# Patient Record
Sex: Female | Born: 1950 | Race: White | Hispanic: No | Marital: Single | Smoking: Former smoker
Health system: Southern US, Community
[De-identification: ages and names within clinical notes are randomized; demographics above are authoritative.]

## PROBLEM LIST (undated history)

## (undated) DIAGNOSIS — Z923 Personal history of irradiation: Secondary | ICD-10-CM

## (undated) DIAGNOSIS — K449 Diaphragmatic hernia without obstruction or gangrene: Secondary | ICD-10-CM

## (undated) DIAGNOSIS — Z8669 Personal history of other diseases of the nervous system and sense organs: Secondary | ICD-10-CM

## (undated) DIAGNOSIS — M1990 Unspecified osteoarthritis, unspecified site: Secondary | ICD-10-CM

## (undated) DIAGNOSIS — F329 Major depressive disorder, single episode, unspecified: Secondary | ICD-10-CM

## (undated) DIAGNOSIS — Z5189 Encounter for other specified aftercare: Secondary | ICD-10-CM

## (undated) DIAGNOSIS — F32A Depression, unspecified: Secondary | ICD-10-CM

## (undated) DIAGNOSIS — R3129 Other microscopic hematuria: Secondary | ICD-10-CM

## (undated) DIAGNOSIS — C801 Malignant (primary) neoplasm, unspecified: Secondary | ICD-10-CM

## (undated) DIAGNOSIS — R7301 Impaired fasting glucose: Secondary | ICD-10-CM

## (undated) DIAGNOSIS — Z0000 Encounter for general adult medical examination without abnormal findings: Secondary | ICD-10-CM

## (undated) DIAGNOSIS — Z9221 Personal history of antineoplastic chemotherapy: Secondary | ICD-10-CM

## (undated) DIAGNOSIS — K219 Gastro-esophageal reflux disease without esophagitis: Secondary | ICD-10-CM

## (undated) DIAGNOSIS — Z8619 Personal history of other infectious and parasitic diseases: Secondary | ICD-10-CM

## (undated) DIAGNOSIS — L589 Radiodermatitis, unspecified: Secondary | ICD-10-CM

## (undated) DIAGNOSIS — R519 Headache, unspecified: Secondary | ICD-10-CM

## (undated) DIAGNOSIS — T7840XA Allergy, unspecified, initial encounter: Secondary | ICD-10-CM

## (undated) HISTORY — DX: Encounter for other specified aftercare: Z51.89

## (undated) HISTORY — DX: Gastro-esophageal reflux disease without esophagitis: K21.9

## (undated) HISTORY — DX: Allergy, unspecified, initial encounter: T78.40XA

## (undated) HISTORY — DX: Major depressive disorder, single episode, unspecified: F32.9

## (undated) HISTORY — DX: Personal history of other diseases of the nervous system and sense organs: Z86.69

## (undated) HISTORY — PX: OTHER SURGICAL HISTORY: SHX169

## (undated) HISTORY — DX: Personal history of other infectious and parasitic diseases: Z86.19

## (undated) HISTORY — DX: Malignant (primary) neoplasm, unspecified: C80.1

## (undated) HISTORY — DX: Encounter for general adult medical examination without abnormal findings: Z00.00

## (undated) HISTORY — DX: Other microscopic hematuria: R31.29

## (undated) HISTORY — DX: Unspecified osteoarthritis, unspecified site: M19.90

## (undated) HISTORY — DX: Impaired fasting glucose: R73.01

## (undated) HISTORY — DX: Depression, unspecified: F32.A

---

## 1998-01-07 ENCOUNTER — Ambulatory Visit: Admission: RE | Admit: 1998-01-07 | Discharge: 1998-01-07 | Payer: Self-pay | Admitting: Family Medicine

## 1998-01-19 ENCOUNTER — Ambulatory Visit: Admission: RE | Admit: 1998-01-19 | Discharge: 1998-01-19 | Payer: Self-pay | Admitting: Family Medicine

## 1998-11-11 ENCOUNTER — Ambulatory Visit (HOSPITAL_COMMUNITY): Admission: RE | Admit: 1998-11-11 | Discharge: 1998-11-11 | Payer: Self-pay | Admitting: Family Medicine

## 1998-11-11 ENCOUNTER — Encounter: Payer: Self-pay | Admitting: Family Medicine

## 1999-03-21 ENCOUNTER — Other Ambulatory Visit: Admission: RE | Admit: 1999-03-21 | Discharge: 1999-03-21 | Payer: Self-pay | Admitting: Gynecology

## 1999-12-14 ENCOUNTER — Encounter: Admission: RE | Admit: 1999-12-14 | Discharge: 1999-12-14 | Payer: Self-pay | Admitting: Family Medicine

## 1999-12-14 ENCOUNTER — Encounter: Payer: Self-pay | Admitting: Family Medicine

## 2000-04-01 ENCOUNTER — Other Ambulatory Visit: Admission: RE | Admit: 2000-04-01 | Discharge: 2000-04-01 | Payer: Self-pay | Admitting: Gynecology

## 2001-04-03 ENCOUNTER — Encounter: Admission: RE | Admit: 2001-04-03 | Discharge: 2001-04-03 | Payer: Self-pay | Admitting: Gynecology

## 2001-04-03 ENCOUNTER — Encounter: Payer: Self-pay | Admitting: Gynecology

## 2001-04-03 ENCOUNTER — Other Ambulatory Visit: Admission: RE | Admit: 2001-04-03 | Discharge: 2001-04-03 | Payer: Self-pay | Admitting: Gynecology

## 2001-10-25 ENCOUNTER — Ambulatory Visit (HOSPITAL_COMMUNITY): Admission: RE | Admit: 2001-10-25 | Discharge: 2001-10-25 | Payer: Self-pay | Admitting: Obstetrics and Gynecology

## 2001-10-25 ENCOUNTER — Encounter (INDEPENDENT_AMBULATORY_CARE_PROVIDER_SITE_OTHER): Payer: Self-pay

## 2003-01-27 ENCOUNTER — Encounter: Payer: Self-pay | Admitting: Obstetrics and Gynecology

## 2003-01-27 ENCOUNTER — Encounter: Admission: RE | Admit: 2003-01-27 | Discharge: 2003-01-27 | Payer: Self-pay | Admitting: Obstetrics and Gynecology

## 2003-02-02 ENCOUNTER — Other Ambulatory Visit: Admission: RE | Admit: 2003-02-02 | Discharge: 2003-02-02 | Payer: Self-pay | Admitting: Obstetrics and Gynecology

## 2004-04-17 ENCOUNTER — Ambulatory Visit: Payer: Self-pay | Admitting: Internal Medicine

## 2004-04-24 ENCOUNTER — Ambulatory Visit: Payer: Self-pay | Admitting: Internal Medicine

## 2005-02-06 ENCOUNTER — Ambulatory Visit: Payer: Self-pay | Admitting: Internal Medicine

## 2005-08-02 ENCOUNTER — Ambulatory Visit: Payer: Self-pay | Admitting: Internal Medicine

## 2005-08-13 ENCOUNTER — Ambulatory Visit: Payer: Self-pay | Admitting: Internal Medicine

## 2005-10-09 ENCOUNTER — Ambulatory Visit: Payer: Self-pay | Admitting: Internal Medicine

## 2006-08-05 ENCOUNTER — Ambulatory Visit: Payer: Self-pay | Admitting: Internal Medicine

## 2006-08-19 ENCOUNTER — Ambulatory Visit: Payer: Self-pay | Admitting: Internal Medicine

## 2006-08-27 ENCOUNTER — Ambulatory Visit (HOSPITAL_COMMUNITY): Admission: RE | Admit: 2006-08-27 | Discharge: 2006-08-27 | Payer: Self-pay | Admitting: Internal Medicine

## 2006-11-12 ENCOUNTER — Ambulatory Visit: Payer: Self-pay | Admitting: Internal Medicine

## 2006-11-12 LAB — CONVERTED CEMR LAB
Albumin: 3.8 g/dL (ref 3.5–5.2)
Basophils Absolute: 0 10*3/uL (ref 0.0–0.1)
Bilirubin, Direct: 0.1 mg/dL (ref 0.0–0.3)
Calcium: 9.2 mg/dL (ref 8.4–10.5)
Cholesterol: 177 mg/dL (ref 0–200)
Eosinophils Absolute: 0.1 10*3/uL (ref 0.0–0.6)
Eosinophils Relative: 1.9 % (ref 0.0–5.0)
GFR calc Af Amer: 96 mL/min
GFR calc non Af Amer: 79 mL/min
Glucose, Bld: 110 mg/dL — ABNORMAL HIGH (ref 70–99)
Hgb A1c MFr Bld: 5.9 % (ref 4.6–6.0)
Lymphocytes Relative: 37.1 % (ref 12.0–46.0)
MCHC: 34.5 g/dL (ref 30.0–36.0)
MCV: 92.4 fL (ref 78.0–100.0)
Neutro Abs: 2.3 10*3/uL (ref 1.4–7.7)
Neutrophils Relative %: 52.6 % (ref 43.0–77.0)
Platelets: 241 10*3/uL (ref 150–400)
Sodium: 143 meq/L (ref 135–145)
TSH: 1.04 microintl units/mL (ref 0.35–5.50)
Total CHOL/HDL Ratio: 3.1
Triglycerides: 50 mg/dL (ref 0–149)
WBC: 4.5 10*3/uL (ref 4.5–10.5)

## 2006-11-19 ENCOUNTER — Other Ambulatory Visit: Admission: RE | Admit: 2006-11-19 | Discharge: 2006-11-19 | Payer: Self-pay | Admitting: Internal Medicine

## 2006-11-19 ENCOUNTER — Encounter: Payer: Self-pay | Admitting: Internal Medicine

## 2006-11-19 ENCOUNTER — Ambulatory Visit: Payer: Self-pay | Admitting: Internal Medicine

## 2006-11-19 LAB — CONVERTED CEMR LAB

## 2006-11-22 ENCOUNTER — Ambulatory Visit: Payer: Self-pay | Admitting: Internal Medicine

## 2007-06-09 ENCOUNTER — Ambulatory Visit: Payer: Self-pay | Admitting: Internal Medicine

## 2007-06-09 DIAGNOSIS — N3000 Acute cystitis without hematuria: Secondary | ICD-10-CM | POA: Insufficient documentation

## 2007-06-09 LAB — CONVERTED CEMR LAB
Bilirubin Urine: NEGATIVE
Glucose, Urine, Semiquant: NEGATIVE
Ketones, urine, test strip: NEGATIVE
Specific Gravity, Urine: 1.02
pH: 6.5

## 2007-06-19 ENCOUNTER — Telehealth: Payer: Self-pay | Admitting: *Deleted

## 2008-01-20 ENCOUNTER — Telehealth: Payer: Self-pay | Admitting: *Deleted

## 2008-01-20 ENCOUNTER — Encounter: Payer: Self-pay | Admitting: Internal Medicine

## 2008-01-20 DIAGNOSIS — J309 Allergic rhinitis, unspecified: Secondary | ICD-10-CM | POA: Insufficient documentation

## 2008-01-20 DIAGNOSIS — F32A Depression, unspecified: Secondary | ICD-10-CM | POA: Insufficient documentation

## 2008-01-20 DIAGNOSIS — G43909 Migraine, unspecified, not intractable, without status migrainosus: Secondary | ICD-10-CM | POA: Insufficient documentation

## 2008-01-20 DIAGNOSIS — F329 Major depressive disorder, single episode, unspecified: Secondary | ICD-10-CM

## 2008-02-05 ENCOUNTER — Ambulatory Visit (HOSPITAL_COMMUNITY): Admission: RE | Admit: 2008-02-05 | Discharge: 2008-02-05 | Payer: Self-pay | Admitting: Internal Medicine

## 2008-02-11 ENCOUNTER — Ambulatory Visit: Payer: Self-pay | Admitting: Internal Medicine

## 2008-02-11 ENCOUNTER — Encounter: Admission: RE | Admit: 2008-02-11 | Discharge: 2008-02-11 | Payer: Self-pay | Admitting: Internal Medicine

## 2008-02-11 LAB — CONVERTED CEMR LAB
AST: 16 units/L (ref 0–37)
Albumin: 4.1 g/dL (ref 3.5–5.2)
Alkaline Phosphatase: 67 units/L (ref 39–117)
BUN: 20 mg/dL (ref 6–23)
Chloride: 110 meq/L (ref 96–112)
Eosinophils Relative: 1.4 % (ref 0.0–5.0)
GFR calc Af Amer: 96 mL/min
Glucose, Bld: 110 mg/dL — ABNORMAL HIGH (ref 70–99)
HCT: 39.6 % (ref 36.0–46.0)
HDL: 50.5 mg/dL (ref >39.0)
Monocytes Absolute: 0.4 10*3/uL (ref 0.1–1.0)
Monocytes Relative: 7.5 % (ref 3.0–12.0)
Nitrite: NEGATIVE
Platelets: 260 10*3/uL (ref 150–400)
Potassium: 5.5 meq/L — ABNORMAL HIGH (ref 3.5–5.1)
RBC: 4.23 M/uL (ref 3.87–5.11)
Specific Gravity, Urine: 1.015
Total CHOL/HDL Ratio: 3.4
Total Protein: 7.5 g/dL (ref 6.0–8.3)
WBC Urine, dipstick: NEGATIVE
WBC: 5.4 10*3/uL (ref 4.5–10.5)

## 2008-02-18 ENCOUNTER — Telehealth: Payer: Self-pay | Admitting: Internal Medicine

## 2008-02-18 ENCOUNTER — Ambulatory Visit: Payer: Self-pay | Admitting: Internal Medicine

## 2008-02-18 DIAGNOSIS — R7309 Other abnormal glucose: Secondary | ICD-10-CM | POA: Insufficient documentation

## 2008-02-18 DIAGNOSIS — R3129 Other microscopic hematuria: Secondary | ICD-10-CM | POA: Insufficient documentation

## 2008-03-23 ENCOUNTER — Telehealth: Payer: Self-pay | Admitting: Internal Medicine

## 2008-04-26 ENCOUNTER — Telehealth: Payer: Self-pay | Admitting: Internal Medicine

## 2008-04-28 ENCOUNTER — Ambulatory Visit: Payer: Self-pay | Admitting: Internal Medicine

## 2008-04-29 ENCOUNTER — Encounter: Payer: Self-pay | Admitting: Internal Medicine

## 2008-04-29 LAB — CONVERTED CEMR LAB: Total CHOL/HDL Ratio: 2.8

## 2008-05-12 ENCOUNTER — Telehealth: Payer: Self-pay | Admitting: Internal Medicine

## 2009-04-04 ENCOUNTER — Ambulatory Visit: Payer: Self-pay | Admitting: Internal Medicine

## 2009-04-04 LAB — CONVERTED CEMR LAB
AST: 15 units/L (ref 0–37)
Basophils Absolute: 0 10*3/uL (ref 0.0–0.1)
CO2: 31 meq/L (ref 19–32)
Chloride: 104 meq/L (ref 96–112)
Eosinophils Absolute: 0.1 10*3/uL (ref 0.0–0.7)
HCT: 40.6 % (ref 36.0–46.0)
HDL: 59 mg/dL (ref >39.00)
LDL Cholesterol: 107 mg/dL — ABNORMAL HIGH (ref 0–99)
Leukocytes, UA: NEGATIVE
Lymphocytes Relative: 38.6 % (ref 12.0–46.0)
Lymphs Abs: 1.9 10*3/uL (ref 0.7–4.0)
Monocytes Relative: 7.6 % (ref 3.0–12.0)
Nitrite: NEGATIVE
Platelets: 213 10*3/uL (ref 150.0–400.0)
RDW: 11.9 % (ref 11.5–14.6)
Sodium: 143 meq/L (ref 135–145)
Specific Gravity, Urine: 1.015 (ref 1.000–1.030)
TSH: 1.7 microintl units/mL (ref 0.35–5.50)
Total Bilirubin: 0.7 mg/dL (ref 0.3–1.2)
Total CHOL/HDL Ratio: 3
Triglycerides: 28 mg/dL (ref 0.0–149.0)
Urine Glucose: NEGATIVE mg/dL
Urobilinogen, UA: 0.2 (ref 0.0–1.0)

## 2009-04-11 ENCOUNTER — Ambulatory Visit: Payer: Self-pay | Admitting: Internal Medicine

## 2009-04-11 DIAGNOSIS — H519 Unspecified disorder of binocular movement: Secondary | ICD-10-CM | POA: Insufficient documentation

## 2009-04-29 ENCOUNTER — Ambulatory Visit (HOSPITAL_BASED_OUTPATIENT_CLINIC_OR_DEPARTMENT_OTHER): Admission: RE | Admit: 2009-04-29 | Discharge: 2009-04-29 | Payer: Self-pay | Admitting: Ophthalmology

## 2009-04-29 HISTORY — PX: EYE SURGERY: SHX253

## 2010-03-30 ENCOUNTER — Ambulatory Visit (HOSPITAL_COMMUNITY): Admission: RE | Admit: 2010-03-30 | Discharge: 2010-03-30 | Payer: Self-pay | Admitting: Internal Medicine

## 2010-03-30 LAB — HM MAMMOGRAPHY

## 2010-04-13 ENCOUNTER — Telehealth: Payer: Self-pay | Admitting: Internal Medicine

## 2010-04-17 ENCOUNTER — Encounter: Payer: Self-pay | Admitting: Internal Medicine

## 2010-04-24 ENCOUNTER — Ambulatory Visit: Payer: Self-pay | Admitting: Internal Medicine

## 2010-07-03 ENCOUNTER — Encounter: Payer: Self-pay | Admitting: Internal Medicine

## 2010-07-13 NOTE — Letter (Signed)
Summary: Health Screening Results  Health Screening Results   Imported By: Maryln Gottron 04/27/2010 10:13:07  _____________________________________________________________________  External Attachment:    Type:   Image     Comment:   External Document

## 2010-07-13 NOTE — Progress Notes (Signed)
Summary: Pt has question re: cpx labs and Health Screening labs  Phone Note Call from Patient Call back at Home Phone [phone]   Caller: Patient Summary of Call: Pt is sch to have cpx labs done on Monday 04/17/10 and is also sch to have her Health Screening done on the same day. The same labs are going to be drawn, so pt is wondering if she can just use the Health Screening labs, as her cpx labs? Pls advise. Pt also wanted to know if she could do the UA on 04/24/10, when she is sch for the cpx? Initial call taken by: Lucy Antigua,  April 13, 2010 8:58 AM  Follow-up for Phone Call        Per Dr. Fabian Sharp- this is fine, just have her bring in a copy when she comes in for her cpx. We can do a ua then. Follow-up by: Romualdo Bolk, CMA Duncan Dull),  April 13, 2010 11:31 AM  Additional Follow-up for Phone Call Additional follow up Details #1::        Pt has informed with the info noted above. Additional Follow-up by: Lucy Antigua,  April 13, 2010 1:30 PM

## 2010-07-13 NOTE — Assessment & Plan Note (Signed)
Summary: CPX // RS   Vital Signs:  Patient profile:   60 year old female Menstrual status:  postmenopausal Height:      63.75 inches Weight:      144 pounds BMI:     25.00 Temp:     98.3 degrees F oral Pulse rate:   74 / minute BP sitting:   102 / 60  (left arm) Cuff size:   regular  Vitals Entered By: Alfred Levins, CMA (April 24, 2010 8:55 AM) CC: cpx, no pap   History of Present Illness: Cindy [name] comes in today  for preventive visit . Since last visit  here  there have been no major changes in health status  . She has not had her urology check but did  have mammmo . Is due for  gyne check  . Last pap 2009 and plans to see her gyne. No new problems  Mood Prozac doing well and wants to continue this  Immuniz utd  flu shot at work.  had lab at [facility].   Preventive Screening-Counseling & Management  Alcohol-Tobacco     Alcohol drinks/day: <1     Alcohol type: wine     Smoking Status: quit     Year Quit: age 86  Caffeine-Diet-Exercise     Caffeine use/day: 2     Does Patient Exercise: no  Hep-HIV-STD-Contraception     Dental Visit-last 6 months no     Sun Exposure-Excessive: no  Safety-Violence-Falls     Seat Belt Use: yes     Firearms in the Home: no firearms in the home     Smoke Detectors: yes  Current Medications (verified): 1)  Prozac 20 Mg  Caps (Fluoxetine Hcl) .Marland Kitchen.. 1 By Mouth Once Daily  Allergies (verified): 1)  ! Effexor 2)  ! Bromfed  Past History:  Past medical, surgical, family and social histories (including risk factors) reviewed, and no changes noted (except as noted below).  Past Medical History: Reviewed history from 04/11/2009 and no changes required. Depression   ? if unipolar bipolar  works the best.  Allergic rhinitis Hx of migraines  Hx elevated fbs   G1P0 CONSULTANTS : gyne : [facility]    Past Surgical History: Reviewed history from 04/11/2009 and no changes required. endometrial polyps  Rt eye tighten up  04/29/2009  Family History: Reviewed history from 04/11/2009 and no changes required. Father bladder cancer with cystectomy, thyroid  Family History of Colon CA 1st degree relative mom  alzheimers 77.   no dm  Sibs 2 bro and sis healthy.  Social History: Reviewed history from 04/11/2009 and no changes required. hh of 1  2 cats  etoh   caffeine   2 mugs  exercise   [facility].    Review of Systems  The patient denies anorexia, fever, weight loss, weight gain, vision loss, decreased hearing, hoarseness, chest pain, syncope, dyspnea on exertion, peripheral edema, prolonged cough, headaches, hemoptysis, abdominal pain, melena, hematochezia, severe indigestion/heartburn, hematuria, incontinence, muscle weakness, suspicious skin lesions, transient blindness, difficulty walking, depression, unusual weight change, abnormal bleeding, enlarged lymph nodes, angioedema, and breast masses.    Physical Exam  General:  Well-developed,well-nourished,in no acute distress; alert,appropriate and cooperative throughout examination Head:  Normocephalic and atraumatic without obvious abnormalities. No apparent alopecia or balding. Eyes:  PERRL, EOMs full, conjunctiva clear  Ears:  R ear normal, L ear normal, and no external deformities.   Nose:  no external deformity and no nasal discharge.  Mouth:  good dentition and pharynx pink and moist.   Neck:  No deformities, masses, or tenderness noted. Breasts:  No mass, nodules, thickening, tenderness, bulging, retraction, inflamation, nipple discharge or skin changes noted.   Lungs:  Normal respiratory effort, chest expands symmetrically. Lungs are clear to auscultation, no crackles or wheezes.no dullness.   Heart:  Normal rate and regular rhythm. S1 and S2 normal without gallop, murmur, click, rub or other extra sounds.no lifts.   Abdomen:  Bowel sounds positive,abdomen soft and non-tender without masses, organomegaly or hernias  noted. Genitalia:  due for with gyne  Pulses:  pulses intact without delay   Extremities:  no clubbing cyanosis or edema  Neurologic:  alert & oriented X3, strength normal in all extremities, gait normal, and DTRs symmetrical and normal.   Pt is A&Ox3,affect,speech,memory,attention,&motor skills appear intact.  Skin:  turgor normal, color normal, no suspicious lesions, no ecchymoses, and no petechiae.   Cervical Nodes:  No lymphadenopathy noted Axillary Nodes:  No palpable lymphadenopathy Inguinal Nodes:  No significant adenopathy Psych:  Normal eye contact, appropriate affect. Cognition appears normal.  EKG sinus rhythym. No chage  q in  V2 no acute changes   Labs from [facility] are normal except bg 100  Impression & Recommendations:  Problem # 1:  PREVENTIVE HEALTH CARE (ICD-V70.0) Discussed nutrition,exercise,diet,healthy weight, vitamin D and calcium.  Orders: UA Dipstick w/o Micro (automated)  (81003) EKG w/ Interpretation (93000)  Problem # 2:  HYPERGLYCEMIA (ICD-790.29) Assessment: Improved  lifestyle intervention  Labs Reviewed: Creat: 0.8 (04/04/2009)     Orders: EKG w/ Interpretation (93000)  Problem # 3:  MICROSCOPIC HEMATURIA (ICD-599.72) didi follow up for various reasons   will recheck ua today  .  has family hx of bladder cancer  Orders: UA Dipstick w/o Micro (automated)  (81003)  Problem # 4:  DEPRESSION (ICD-311) Assessment: Improved  stable    continue med for the year  have pharmacy call for refill    do yearly check.  Her updated medication list for this problem includes:    Prozac 20 Mg Caps (Fluoxetine hcl) .Marland Kitchen... 1 by mouth once daily  Discussed treatment options, including trial of antidpressant medication. Will refer to behavioral health. Follow-up call in in 24-48 hours and recheck in 2 weeks, sooner as needed. Patient agrees to call if any worsening of symptoms or thoughts of doing harm arise. Verified that the patient has no suicidal  ideation at this time.   Complete Medication List: 1)  Prozac 20 Mg Caps (Fluoxetine hcl) .Marland Kitchen.. 1 by mouth once daily  Patient Instructions: 1)  get your pap gyne exam 2)  exercise,  and diet with calcium and vit d in it will prevent osteoporosis. 3)  Get urology evaluation with Dr Isabel Caprice and call if we need to help. 4)  have pharmacy call for reill  5)  Check up in a year or as needed.   Orders Added: 1)  UA Dipstick w/o Micro (automated)  [81003] 2)  EKG w/ Interpretation [93000] 3)  Est. Patient 40-64 years [99396]    Preventive Care Screening  Prior Values:    Pap Smear:  Done (11/19/2006)    Mammogram:  ASSESSMENT: Negative - BI-RADS 1^MM DIGITAL SCREENING (03/30/2010)    Colonoscopy:  Done (08/19/2006)    Last Tetanus Booster:  Tdap (11/19/2006)  Appended Document: CPX // RS  Laboratory Results   Urine Tests    Routine Urinalysis   Color: yellow Appearance: Clear Glucose: negative   (  Normal Range: Negative) Bilirubin: negative   (Normal Range: Negative) Ketone: negative   (Normal Range: Negative) Spec. Gravity: 1.015   (Normal Range: 1.003-1.035) Blood: trace-intact   (Normal Range: Negative) pH: 8.0   (Normal Range: 5.0-8.0) Protein: negative   (Normal Range: Negative) Urobilinogen: 0.2   (Normal Range: 0-1) Nitrite: negative   (Normal Range: Negative) Leukocyte Esterace: negative   (Normal Range: Negative)    Comments: Rita Ohara  April 24, 2010 2:02 PM     tell patient  that there is trace blood in urine  today  wkp.    Pt aware of results. Romualdo Bolk, CMA (AAMA)  April 26, 2010 9:14 AM

## 2010-09-05 ENCOUNTER — Encounter: Payer: Self-pay | Admitting: Internal Medicine

## 2010-09-05 ENCOUNTER — Ambulatory Visit (HOSPITAL_COMMUNITY)
Admission: RE | Admit: 2010-09-05 | Discharge: 2010-09-05 | Disposition: A | Discharge status: Home / Self Care | Payer: Commercial Managed Care - PPO | Source: Ambulatory Visit | Attending: Internal Medicine | Admitting: Internal Medicine

## 2010-09-05 ENCOUNTER — Other Ambulatory Visit: Payer: Self-pay | Admitting: Internal Medicine

## 2010-09-05 ENCOUNTER — Telehealth: Payer: Self-pay | Admitting: *Deleted

## 2010-09-05 DIAGNOSIS — R059 Cough, unspecified: Secondary | ICD-10-CM

## 2010-09-05 DIAGNOSIS — R05 Cough: Secondary | ICD-10-CM

## 2010-09-05 DIAGNOSIS — R079 Chest pain, unspecified: Secondary | ICD-10-CM | POA: Insufficient documentation

## 2010-09-05 NOTE — Telephone Encounter (Signed)
Pt would like Dr. Fabian Sharp to order a chest xray as her father has pneumonia and she wants to be sure her chest is clear.  She does not feel well, and is having a productive cough (not sure what color).  The order needs to be called to [employer] where she works.

## 2010-09-05 NOTE — Telephone Encounter (Signed)
Left message to call back  

## 2010-09-05 NOTE — Telephone Encounter (Signed)
Spoke to pt and she hasn't taken her temp but feels feverish. Pt would like the rx faxed to [phone] attn: X-ray. Appt made and note faxed.

## 2010-09-05 NOTE — Telephone Encounter (Signed)
Her original note states she does not think she has fever but not feel well and has productive cough with ? Color of sputum.>???

## 2010-09-05 NOTE — Telephone Encounter (Signed)
?   Fever  If not feeling well we can order a chest x ray and then an Ov to review clinical exam and treatment plan

## 2010-09-05 NOTE — Telephone Encounter (Signed)
Pt calls back and is still not sure about fever but does not feel well.  Will call us back when she needs to come back and how to schedule chest xray.

## 2010-09-06 ENCOUNTER — Encounter: Payer: Self-pay | Admitting: Internal Medicine

## 2010-09-06 ENCOUNTER — Ambulatory Visit (INDEPENDENT_AMBULATORY_CARE_PROVIDER_SITE_OTHER): Payer: Commercial Managed Care - PPO | Admitting: Internal Medicine

## 2010-09-06 VITALS — BP 120/80 | HR 65 | Temp 98.6°F | Wt 142.0 lb

## 2010-09-06 DIAGNOSIS — J309 Allergic rhinitis, unspecified: Secondary | ICD-10-CM

## 2010-09-06 DIAGNOSIS — R059 Cough, unspecified: Secondary | ICD-10-CM

## 2010-09-06 DIAGNOSIS — R05 Cough: Secondary | ICD-10-CM

## 2010-09-06 NOTE — Progress Notes (Signed)
  Subjective:    Patient ID: Cindy [name], female    DOB: 1951-04-26, 60 y.o.   MRN: [id number]  HPI Patient comes in because of an acute problem. Onset  5 days ago cough   No fever felt feverish . She was concerned because her father a week or so ago had significant pneumonia. He does have COPD. She was worried that she could be getting pneumonia. Denies shortness of breath asthma or wheezing. She does have a history of allergies and is having some drainage and congestion but no significant itching no nocturnal cough or wheeze  Not taking allergy meds.     They tend to give her side effects and she minimizes her medications.  Review of Systems Negative for shortness of breath fever sweats vision change hearing change.    Objective:   Physical Exam Well-developed well-nourished in no acute distress mildly hoarse with throat clearing. HEENT: Normocephalic ;atraumatic , Eyes;  PERRL, EOMs  Full, lids and conjunctiva clear,,Ears: no deformities, canals nl, TM landmarks normal, Nose: no deformity or discharge  Mouth : OP clear without lesion or edema . Neck no masses or bruits or adenopathy Chest:  Clear to A&P without wheezes rales or rhonchi CV:  S1-S2 no gallops or murmurs peripheral perfusion is normal Reviewed chest x-ray  Negative for acute disease    Assessment & Plan:   Cough   either viral respiratory infection uncomplicated or allergic related. No evidence of air space disease. Discussion and counseling with patient expectant management. Patient prefers no medication symptomatically and I agree with her she will call with persistence or progressive or alarm features.

## 2010-10-27 NOTE — Op Note (Signed)
[facility]  Patient:    Cindy [name], Cindy [name] Visit Number: [id number] MRN: [id number]          Service Type: DSU Location: [facility] Attending Physician:  Sharon Mt Dictated by:   Rande Brunt. Eda Paschal, M.D. Proc. Date: 10/25/01 Admit Date:  10/25/2001 Discharge Date: 10/25/2001                             Operative Report  PREOPERATIVE DIAGNOSES:       Dysfunctional uterine bleeding, endometrial polyp.  POSTOPERATIVE DIAGNOSES:      Dysfunctional uterine bleeding, endometrial polyp.  NAME OF OPERATION:            Hysteroscopy, dilatation and curettage.  SURGEON:                      Daniel L. Eda Paschal, M.D.  ANESTHESIA:                   MAC.  INDICATIONS:                  Patient is a 60 year old female who presented to the office with menometrorrhagia.  Ultrasound confirmed an intrauterine cavitary defect most consistent with an endometrial polyp and she now enters the hospital for hysteroscopy and excision of the above.  FINDINGS:                     External and vaginal is within normal limits. Cervix is clean.  Uterus is top normal size and shape.  There is no uterine descensus whatsoever.  Adnexa are palpably normal.  At the time of hysteroscopy patient had a 1.5-2 cm endometrial polyp coming off the posterior wall of the fundus.  Once it had been excised top of the fundus, tubal ostia, anterior and posterior walls of the fundus, lower uterine segment, and endocervical canal were all free of disease.  PROCEDURE:                    Patient was taken to the operating room, put in the dorsal lithotomy position, prepped and draped in the usual sterile manner.  She was then given IV sedation by anesthesia and a 20 cc 1% plain paracervical block by Dr. Eda Paschal.  Anesthesia was good.  The cervix was dilated to a #33 Pratt dilator.  Hysteroscopic examination was done using the hysteroscopic resectoscope and using 3% Sorbitol to expand  the intrauterine cavity.  A camera was used for magnification.  A good view was obtained.  The patient had a large polyp on the posterior wall of the fundus.  The hysteroscope was removed and using the Randall polyp forceps the polyp could be removed without difficulty.  Endometrial curettings were also obtained and all was sent to pathology for tissue diagnosis.  The patient was rehysteroscoped and now had no further pathology.  The procedure was then terminated.  Estimated blood loss for the entire procedure was 50 cc with none replaced.  Fluid deficit was less than 100 cc.  Patient sustained a small laceration on the anterior lip of the cervix and this was sutured with three interrupteds of 2-0 Vicryl.  Patient left the operating room in satisfactory condition. Dictated by:   Rande Brunt. Eda Paschal, M.D. Attending Physician:  Sharon Mt DD:  10/25/01 TD:  10/27/01 Job: [id number]

## 2010-12-21 ENCOUNTER — Other Ambulatory Visit: Payer: Self-pay | Admitting: Internal Medicine

## 2011-03-22 ENCOUNTER — Other Ambulatory Visit: Payer: Self-pay | Admitting: Internal Medicine

## 2011-03-22 MED ORDER — FLUOXETINE HCL 20 MG PO CAPS: 20.0000 mg | ORAL_CAPSULE | Freq: Every day | ORAL | Status: DC

## 2011-03-22 NOTE — Telephone Encounter (Signed)
Pt switch phar location ([facility] [address]) [phone] need new rx fluoxetine 20mg .

## 2011-03-22 NOTE — Telephone Encounter (Signed)
Rx sent to pharmacy   

## 2011-06-08 ENCOUNTER — Telehealth: Payer: Self-pay | Admitting: Internal Medicine

## 2011-06-08 NOTE — Telephone Encounter (Signed)
Ok with me to do this

## 2011-06-08 NOTE — Telephone Encounter (Signed)
Patient states that she lives closer to [facility] location and would like to start seeing either Dr.Lowne or Dr. Beverely Low. Is this okay with you?

## 2011-06-13 ENCOUNTER — Other Ambulatory Visit: Payer: Commercial Managed Care - PPO

## 2011-06-20 ENCOUNTER — Other Ambulatory Visit (HOSPITAL_COMMUNITY)
Admission: RE | Admit: 2011-06-20 | Discharge: 2011-06-20 | Disposition: A | Discharge status: Home / Self Care | Payer: Managed Care, Other (non HMO) | Source: Ambulatory Visit | Attending: Family Medicine | Admitting: Family Medicine

## 2011-06-20 ENCOUNTER — Ambulatory Visit (INDEPENDENT_AMBULATORY_CARE_PROVIDER_SITE_OTHER): Payer: Commercial Managed Care - PPO | Admitting: Family Medicine

## 2011-06-20 ENCOUNTER — Encounter: Payer: Self-pay | Admitting: Family Medicine

## 2011-06-20 ENCOUNTER — Encounter: Payer: Commercial Managed Care - PPO | Admitting: Internal Medicine

## 2011-06-20 DIAGNOSIS — Z01419 Encounter for gynecological examination (general) (routine) without abnormal findings: Secondary | ICD-10-CM | POA: Insufficient documentation

## 2011-06-20 DIAGNOSIS — Z0000 Encounter for general adult medical examination without abnormal findings: Secondary | ICD-10-CM | POA: Insufficient documentation

## 2011-06-20 DIAGNOSIS — Z124 Encounter for screening for malignant neoplasm of cervix: Secondary | ICD-10-CM

## 2011-06-20 HISTORY — DX: Encounter for general adult medical examination without abnormal findings: Z00.00

## 2011-06-20 NOTE — Patient Instructions (Signed)
Schedule a fasting lab visit at your convenience Schedule your mammogram Keep up the good work!  You look great! Call with any questions or concerns Welcome!  We're glad to have you!

## 2011-06-20 NOTE — Progress Notes (Signed)
  Subjective:    Patient ID: Cindy [name], female    DOB: 19-Sep-1950, 61 y.o.   MRN: [id number]  HPI New to establish.  Previous MD- Panosh.  No concerns today.  Desires CPE.  UTD on colonoscopy 2008.  Due for mammo- plans to schedule.   Review of Systems Patient reports no vision/ hearing changes, adenopathy,fever, weight change,  persistant/recurrent hoarseness , swallowing issues, chest pain, palpitations, edema, persistant/recurrent cough, hemoptysis, dyspnea (rest/exertional/paroxysmal nocturnal), gastrointestinal bleeding (melena, rectal bleeding), abdominal pain, significant heartburn, bowel changes, GU symptoms (dysuria, hematuria, incontinence), Gyn symptoms (abnormal  bleeding, pain),  syncope, focal weakness, memory loss, numbness & tingling, skin/hair/nail changes, abnormal bruising or bleeding, anxiety, or depression.     Objective:   Physical Exam  General Appearance:    Alert, cooperative, no distress, appears stated age  Head:    Normocephalic, without obvious abnormality, atraumatic  Eyes:    PERRL, conjunctiva/corneas clear, EOM's intact, fundi    benign, both eyes  Ears:    Normal TM's and external ear canals, both ears  Nose:   Nares normal, septum midline, mucosa normal, no drainage    or sinus tenderness  Throat:   Lips, mucosa, and tongue normal; teeth and gums normal  Neck:   Supple, symmetrical, trachea midline, no adenopathy;    Thyroid: no enlargement/tenderness/nodules  Back:     Symmetric, no curvature, ROM normal, no CVA tenderness  Lungs:     Clear to auscultation bilaterally, respirations unlabored  Chest Wall:    No tenderness or deformity   Heart:    Regular rate and rhythm, S1 and S2 normal, no murmur, rub   or gallop  Breast Exam:    No tenderness, masses, or nipple abnormality  Abdomen:     Soft, non-tender, bowel sounds active all four quadrants,    no masses, no organomegaly  Genitalia:    External genitalia normal, cervix normal in  appearance, no CMT, uterus in normal size and position, adnexa w/out mass or tenderness, mucosa pink and moist, no lesions or discharge present  Rectal:    Normal external appearance  Extremities:   Extremities normal, atraumatic, no cyanosis or edema  Pulses:   2+ and symmetric all extremities  Skin:   Skin color, texture, turgor normal, no rashes or lesions  Lymph nodes:   Cervical, supraclavicular, and axillary nodes normal  Neurologic:   CNII-XII intact, normal strength, sensation and reflexes    throughout          Assessment & Plan:

## 2011-06-20 NOTE — Assessment & Plan Note (Signed)
Pt's PE WNL.  Will return for fasting labs.  Plans to schedule mammo.  UTD on colonoscopy.  Anticipatory guidance provided.

## 2011-06-20 NOTE — Assessment & Plan Note (Signed)
Pap collected. 

## 2011-06-25 ENCOUNTER — Other Ambulatory Visit (INDEPENDENT_AMBULATORY_CARE_PROVIDER_SITE_OTHER): Payer: Managed Care, Other (non HMO)

## 2011-06-25 DIAGNOSIS — Z01419 Encounter for gynecological examination (general) (routine) without abnormal findings: Secondary | ICD-10-CM

## 2011-06-25 LAB — CBC WITH DIFFERENTIAL/PLATELET
Basophils Absolute: 0 10*3/uL (ref 0.0–0.1)
Eosinophils Absolute: 0.1 10*3/uL (ref 0.0–0.7)
HCT: 39.3 % (ref 36.0–46.0)
Lymphs Abs: 2.2 10*3/uL (ref 0.7–4.0)
MCHC: 34.5 g/dL (ref 30.0–36.0)
MCV: 93.9 fl (ref 78.0–100.0)
Monocytes Absolute: 0.3 10*3/uL (ref 0.1–1.0)
Neutrophils Relative %: 48.9 % (ref 43.0–77.0)
Platelets: 220 10*3/uL (ref 150.0–400.0)
RDW: 13.1 % (ref 11.5–14.6)
WBC: 5.1 10*3/uL (ref 4.5–10.5)

## 2011-06-25 LAB — HEPATIC FUNCTION PANEL
ALT: 20 U/L (ref 0–35)
Albumin: 4.3 g/dL (ref 3.5–5.2)
Alkaline Phosphatase: 54 U/L (ref 39–117)
Bilirubin, Direct: 0 mg/dL (ref 0.0–0.3)
Total Protein: 7.3 g/dL (ref 6.0–8.3)

## 2011-06-25 LAB — LIPID PANEL
Cholesterol: 207 mg/dL — ABNORMAL HIGH (ref 0–200)
HDL: 71.6 mg/dL (ref >39.00)
VLDL: 8.2 mg/dL (ref 0.0–40.0)

## 2011-06-25 LAB — BASIC METABOLIC PANEL
Calcium: 8.9 mg/dL (ref 8.4–10.5)
GFR: 81.5 mL/min (ref >60.00)
Potassium: 4.6 mEq/L (ref 3.5–5.1)
Sodium: 135 mEq/L (ref 135–145)

## 2011-06-26 ENCOUNTER — Encounter: Payer: Self-pay | Admitting: *Deleted

## 2011-06-29 LAB — VITAMIN D 1,25 DIHYDROXY
Vitamin D 1, 25 (OH)2 Total: 37 pg/mL (ref 18–72)
Vitamin D3 1, 25 (OH)2: 37 pg/mL

## 2011-08-13 ENCOUNTER — Other Ambulatory Visit: Payer: Self-pay | Admitting: Family Medicine

## 2011-08-13 DIAGNOSIS — Z1231 Encounter for screening mammogram for malignant neoplasm of breast: Secondary | ICD-10-CM

## 2011-09-10 ENCOUNTER — Ambulatory Visit (HOSPITAL_COMMUNITY)
Admission: RE | Admit: 2011-09-10 | Discharge: 2011-09-10 | Disposition: A | Discharge status: Home / Self Care | Payer: Managed Care, Other (non HMO) | Source: Ambulatory Visit | Attending: Family Medicine | Admitting: Family Medicine

## 2011-09-10 DIAGNOSIS — Z1231 Encounter for screening mammogram for malignant neoplasm of breast: Secondary | ICD-10-CM

## 2011-09-24 ENCOUNTER — Other Ambulatory Visit: Payer: Self-pay | Admitting: *Deleted

## 2011-09-24 MED ORDER — FLUOXETINE HCL 20 MG PO CAPS: 20.0000 mg | ORAL_CAPSULE | Freq: Every day | ORAL | Status: DC

## 2011-09-24 NOTE — Telephone Encounter (Signed)
Rx sent 

## 2011-11-27 ENCOUNTER — Telehealth: Payer: Self-pay | Admitting: Family Medicine

## 2011-11-27 MED ORDER — FLUOXETINE HCL 20 MG PO CAPS: 20.0000 mg | ORAL_CAPSULE | Freq: Every day | ORAL | Status: DC

## 2011-11-27 NOTE — Telephone Encounter (Signed)
rx sent to pharmacy by e-script #90 with 3 refills per MD Guss Bunde previous notation

## 2011-11-27 NOTE — Telephone Encounter (Signed)
Pt states that she would like get Prozac filled for 3 month supply at a time with refill for a year. Pt indicated that this is how her previous PCP would fill med.Please advise ok to fill for a year.

## 2011-11-27 NOTE — Telephone Encounter (Signed)
This is fine 

## 2011-11-27 NOTE — Telephone Encounter (Signed)
Caller: Collyn/Patient; Phone Number: [phone]; Message from caller: Pt missed a call back from someone in the ofc.  She is unsure of the name.  No notes in EPIC.  Pls call.

## 2011-11-27 NOTE — Telephone Encounter (Signed)
Not sure why this came through

## 2012-07-26 ENCOUNTER — Other Ambulatory Visit: Payer: Self-pay

## 2012-08-25 ENCOUNTER — Other Ambulatory Visit: Payer: Self-pay | Admitting: Family Medicine

## 2012-08-25 DIAGNOSIS — Z1231 Encounter for screening mammogram for malignant neoplasm of breast: Secondary | ICD-10-CM

## 2012-09-24 ENCOUNTER — Ambulatory Visit (HOSPITAL_COMMUNITY): Payer: Managed Care, Other (non HMO)

## 2012-10-08 ENCOUNTER — Ambulatory Visit (HOSPITAL_COMMUNITY): Payer: Managed Care, Other (non HMO)

## 2012-10-15 ENCOUNTER — Ambulatory Visit (HOSPITAL_COMMUNITY): Payer: Managed Care, Other (non HMO)

## 2012-10-22 ENCOUNTER — Encounter: Payer: Managed Care, Other (non HMO) | Admitting: Family Medicine

## 2012-12-22 ENCOUNTER — Telehealth: Payer: Self-pay | Admitting: Family Medicine

## 2012-12-22 NOTE — Telephone Encounter (Signed)
Patient Information:  Caller Name: [place]  Phone: [phone]  Patient: Cindy [name], Cindy [name]  Gender: Female  DOB: 1950-08-09  Age: 62 Years  PCP: Sheliah Hatch.  Office Follow Up:  Does the office need to follow up with this patient?: No  Instructions For The Office: N/A  RN Note:  Cannot be seen 7-14 and wants appointment for 7-15. Scheduled fro 0945.  Symptoms  Reason For Call & Symptoms: Stepped on nail 7-14 and wants to know if tetnus is up to date. Has stoped bleeding.  Reviewed Health History In EMR: Yes  Reviewed Medications In EMR: Yes  Reviewed Allergies In EMR: Yes  Reviewed Surgeries / Procedures: Yes  Date of Onset of Symptoms: Unknown  Guideline(s) Used:  Puncture Wound  Disposition Per Guideline:   See Today in Office  Reason For Disposition Reached:   No tetanus booster in > 5 years  Advice Given:  N/A  Patient Will Follow Care Advice:  YES  Appointment Scheduled:  12/23/2012 09:45:00 Appointment Scheduled Provider:  Sheliah Hatch.

## 2012-12-22 NOTE — Telephone Encounter (Signed)
Pt is scheduled for an appt with Dr. Beverely Low on Tues 12-23-12 @ 9:45am//AB/CMA

## 2012-12-23 ENCOUNTER — Ambulatory Visit (INDEPENDENT_AMBULATORY_CARE_PROVIDER_SITE_OTHER): Payer: Managed Care, Other (non HMO) | Admitting: Family Medicine

## 2012-12-23 ENCOUNTER — Encounter: Payer: Self-pay | Admitting: Family Medicine

## 2012-12-23 VITALS — BP 104/60 | HR 73 | Temp 98.2°F | Ht 64.75 in | Wt 149.0 lb

## 2012-12-23 DIAGNOSIS — S91331A Puncture wound without foreign body, right foot, initial encounter: Secondary | ICD-10-CM

## 2012-12-23 DIAGNOSIS — S91332A Puncture wound without foreign body, left foot, initial encounter: Secondary | ICD-10-CM | POA: Insufficient documentation

## 2012-12-23 DIAGNOSIS — S91309A Unspecified open wound, unspecified foot, initial encounter: Secondary | ICD-10-CM

## 2012-12-23 MED ORDER — CEPHALEXIN 500 MG PO CAPS: 500.0000 mg | ORAL_CAPSULE | Freq: Two times a day (BID) | ORAL | Status: AC

## 2012-12-23 MED ORDER — FLUOXETINE HCL 20 MG PO CAPS: 20.0000 mg | ORAL_CAPSULE | Freq: Every day | ORAL | Status: DC

## 2012-12-23 NOTE — Assessment & Plan Note (Signed)
New.  Area cleaned and dressed.  No evidence of foreign body.  Start abx due to depth of anterior wound.  Td updated.  Reviewed supportive care and red flags that should prompt return.  Pt expressed understanding and is in agreement w/ plan.

## 2012-12-23 NOTE — Progress Notes (Signed)
  Subjective:    Patient ID: Cindy [name], female    DOB: July 15, 1950, 62 y.o.   MRN: [id number]  HPI Puncture wound L foot- stepped on 3 nails yesterday evening, currently having construction done.  Cleaned w/ soap and water.  No purulent drainage.  Anterior wound was deepest- now w/ considerable bruising.  No streaking redness.  Last Tetanus 2008.   Review of Systems For ROS see HPI     Objective:   Physical Exam  Vitals reviewed. Constitutional: She appears well-developed and well-nourished. No distress.  Skin: Skin is warm and dry. No erythema.  3 small puncture wounds on plantar surface of L foot- 1 anteriorly and 2 posteriorly side by side.  Extensive bruising w/ minimal swelling around the anterior wound.  No surrounding erythema, induration, drainage.  Wounds cleaned w/ H2O2, abx ointment and bandaids applied.          Assessment & Plan:

## 2012-12-23 NOTE — Patient Instructions (Addendum)
If you develop worsening redness, swelling, or pain- please let me know Start the Keflex twice daily as a preventative measure Clean with peroxide twice daily and then apply neosporin Call with any questions or concerns Hang in there!!

## 2013-02-12 ENCOUNTER — Encounter: Payer: Managed Care, Other (non HMO) | Admitting: Family Medicine

## 2013-04-16 ENCOUNTER — Other Ambulatory Visit: Payer: Self-pay

## 2014-03-15 ENCOUNTER — Telehealth: Payer: Self-pay | Admitting: General Practice

## 2014-03-15 NOTE — Telephone Encounter (Signed)
Last OV 12-23-12 prozac filled 12/23/12 #90 with 3 refills.   Upcoming CPE 06/2014, ok to fill until appt?

## 2014-03-16 MED ORDER — FLUOXETINE HCL 20 MG PO CAPS: 20.0000 mg | ORAL_CAPSULE | Freq: Every day | ORAL | Status: DC

## 2014-03-16 NOTE — Telephone Encounter (Signed)
Med filled.  

## 2014-03-16 NOTE — Telephone Encounter (Signed)
[place] for #90 w/ 1 which will get pt to her CPE

## 2014-04-12 ENCOUNTER — Encounter: Payer: Self-pay | Admitting: Family Medicine

## 2014-07-08 ENCOUNTER — Encounter: Payer: Self-pay | Admitting: Family Medicine

## 2014-07-08 ENCOUNTER — Ambulatory Visit (INDEPENDENT_AMBULATORY_CARE_PROVIDER_SITE_OTHER): Payer: 59 | Admitting: Family Medicine

## 2014-07-08 VITALS — BP 100/64 | HR 71 | Temp 98.0°F | Resp 16 | Ht 63.75 in | Wt 151.4 lb

## 2014-07-08 DIAGNOSIS — Z0000 Encounter for general adult medical examination without abnormal findings: Secondary | ICD-10-CM

## 2014-07-08 LAB — POCT URINALYSIS DIPSTICK
Bilirubin, UA: NEGATIVE
Glucose, UA: NEGATIVE
Ketones, UA: 0.5
LEUKOCYTES UA: NEGATIVE
Nitrite, UA: NEGATIVE
Protein, UA: NEGATIVE
RBC UA: NEGATIVE
Spec Grav, UA: >=1.03
UROBILINOGEN UA: 2
pH, UA: 5.5

## 2014-07-08 NOTE — Patient Instructions (Signed)
Schedule your pap at your convenience We'll notify you of your lab results and make any changes if needed Call and schedule your mammo at your convenience Keep up the good work!  You look great!! Call with any questions or concerns Happy New Year!!!

## 2014-07-08 NOTE — Progress Notes (Signed)
Pre visit review using our clinic review tool, if applicable. No additional management support is needed unless otherwise documented below in the visit note. 

## 2014-07-08 NOTE — Assessment & Plan Note (Signed)
Pt's PE WNL.  UTD on colonoscopy.  Pt due for pap but not interested in doing this today.  Due for mammo- pt to schedule.  Check labs.  Anticipatory guidance provided.

## 2014-07-08 NOTE — Progress Notes (Signed)
   Subjective:    Patient ID: Cindy [name], female    DOB: [birth year], 64 y.o.   MRN: [id number]  HPI CPE- due for pap, mammo.  UTD on colonoscopy.   Review of Systems Patient reports no vision/ hearing changes, adenopathy,fever, weight change,  persistant/recurrent hoarseness , swallowing issues, chest pain, palpitations, edema, persistant/recurrent cough, hemoptysis, dyspnea (rest/exertional/paroxysmal nocturnal), gastrointestinal bleeding (melena, rectal bleeding), abdominal pain, significant heartburn, bowel changes, GU symptoms (dysuria, hematuria, incontinence), Gyn symptoms (abnormal  bleeding, pain),  syncope, focal weakness, memory loss, numbness & tingling, skin/hair/nail changes, abnormal bruising or bleeding, anxiety, or depression.     Objective:   Physical Exam General Appearance:    Alert, cooperative, no distress, appears stated age  Head:    Normocephalic, without obvious abnormality, atraumatic  Eyes:    PERRL, conjunctiva/corneas clear, EOM's intact, fundi    benign, both eyes  Ears:    Normal TM's and external ear canals, both ears  Nose:   Nares normal, septum midline, mucosa normal, no drainage    or sinus tenderness  Throat:   Lips, mucosa, and tongue normal; teeth and gums normal  Neck:   Supple, symmetrical, trachea midline, no adenopathy;    Thyroid: no enlargement/tenderness/nodules  Back:     Symmetric, no curvature, ROM normal, no CVA tenderness  Lungs:     Clear to auscultation bilaterally, respirations unlabored  Chest Wall:    No tenderness or deformity   Heart:    Regular rate and rhythm, S1 and S2 normal, no murmur, rub   or gallop  Breast Exam:    Deferred to mammo  Abdomen:     Soft, non-tender, bowel sounds active all four quadrants,    no masses, no organomegaly  Genitalia:    Deferred to GYN exam  Rectal:    Extremities:   Extremities normal, atraumatic, no cyanosis or edema  Pulses:   2+ and symmetric all extremities  Skin:   Skin color,  texture, turgor normal, no rashes or lesions  Lymph nodes:   Cervical, supraclavicular, and axillary nodes normal  Neurologic:   CNII-XII intact, normal strength, sensation and reflexes    throughout          Assessment & Plan:

## 2014-07-09 LAB — BASIC METABOLIC PANEL
BUN: 16 mg/dL (ref 6–23)
CO2: 28 mEq/L (ref 19–32)
Calcium: 9.6 mg/dL (ref 8.4–10.5)
Chloride: 105 mEq/L (ref 96–112)
Creatinine, Ser: 0.89 mg/dL (ref 0.40–1.20)
GFR: 68.26 mL/min (ref >60.00)
Glucose, Bld: 98 mg/dL (ref 70–99)
POTASSIUM: 4.5 meq/L (ref 3.5–5.1)
Sodium: 140 mEq/L (ref 135–145)

## 2014-07-09 LAB — HEPATIC FUNCTION PANEL
ALBUMIN: 4.2 g/dL (ref 3.5–5.2)
ALT: 12 U/L (ref 0–35)
AST: 14 U/L (ref 0–37)
Alkaline Phosphatase: 63 U/L (ref 39–117)
BILIRUBIN DIRECT: 0.1 mg/dL (ref 0.0–0.3)
TOTAL PROTEIN: 7 g/dL (ref 6.0–8.3)
Total Bilirubin: 0.4 mg/dL (ref 0.2–1.2)

## 2014-07-09 LAB — CBC WITH DIFFERENTIAL/PLATELET
Basophils Absolute: 0 10*3/uL (ref 0.0–0.1)
Basophils Relative: 0.8 % (ref 0.0–3.0)
EOS ABS: 0.2 10*3/uL (ref 0.0–0.7)
Eosinophils Relative: 2.8 % (ref 0.0–5.0)
HCT: 38.8 % (ref 36.0–46.0)
HEMOGLOBIN: 13.4 g/dL (ref 12.0–15.0)
LYMPHS PCT: 37.4 % (ref 12.0–46.0)
Lymphs Abs: 2.1 10*3/uL (ref 0.7–4.0)
MCHC: 34.5 g/dL (ref 30.0–36.0)
MCV: 89.9 fl (ref 78.0–100.0)
Monocytes Absolute: 0.4 10*3/uL (ref 0.1–1.0)
Monocytes Relative: 7.2 % (ref 3.0–12.0)
NEUTROS ABS: 3 10*3/uL (ref 1.4–7.7)
Neutrophils Relative %: 51.8 % (ref 43.0–77.0)
PLATELETS: 248 10*3/uL (ref 150.0–400.0)
RBC: 4.31 Mil/uL (ref 3.87–5.11)
RDW: 12.9 % (ref 11.5–15.5)
WBC: 5.7 10*3/uL (ref 4.0–10.5)

## 2014-07-09 LAB — VITAMIN D 25 HYDROXY (VIT D DEFICIENCY, FRACTURES): VITD: 42.05 ng/mL (ref 30.00–100.00)

## 2014-07-09 LAB — LIPID PANEL
Cholesterol: 167 mg/dL (ref 0–200)
HDL: 68.3 mg/dL (ref >39.00)
LDL CALC: 87 mg/dL (ref 0–99)
NonHDL: 98.7
Total CHOL/HDL Ratio: 2
Triglycerides: 57 mg/dL (ref 0.0–149.0)
VLDL: 11.4 mg/dL (ref 0.0–40.0)

## 2014-07-09 LAB — TSH: TSH: 1.42 u[IU]/mL (ref 0.35–4.50)

## 2014-08-19 ENCOUNTER — Other Ambulatory Visit: Payer: Self-pay | Admitting: Family Medicine

## 2014-08-19 DIAGNOSIS — Z1231 Encounter for screening mammogram for malignant neoplasm of breast: Secondary | ICD-10-CM

## 2014-09-13 ENCOUNTER — Ambulatory Visit (HOSPITAL_COMMUNITY)
Admission: RE | Admit: 2014-09-13 | Discharge: 2014-09-13 | Disposition: A | Discharge status: Home / Self Care | Payer: 59 | Source: Ambulatory Visit | Attending: Family Medicine | Admitting: Family Medicine

## 2014-09-13 DIAGNOSIS — Z1231 Encounter for screening mammogram for malignant neoplasm of breast: Secondary | ICD-10-CM | POA: Diagnosis not present

## 2014-09-27 ENCOUNTER — Other Ambulatory Visit: Payer: Self-pay | Admitting: Family Medicine

## 2014-09-27 NOTE — Telephone Encounter (Signed)
Med filled.  

## 2015-01-05 ENCOUNTER — Other Ambulatory Visit: Payer: Self-pay | Admitting: Family Medicine

## 2015-02-11 ENCOUNTER — Telehealth: Payer: Self-pay | Admitting: Family Medicine

## 2015-02-11 MED ORDER — FLUOXETINE HCL 20 MG PO CAPS: 20.0000 mg | ORAL_CAPSULE | Freq: Every day | ORAL | Status: DC

## 2015-02-11 NOTE — Telephone Encounter (Signed)
Verbal approval from Dr. Tabori. 

## 2015-02-11 NOTE — Telephone Encounter (Signed)
Relation to pt: self  Call back number: [phone] Pharmacy: [facility] [place], [place]. [phone] (Phone) [phone] (Fax)         Reason for call:  Patient states she typically receives a 3 month supply of FLUoxetine (PROZAC) 20 MG capsule. Patient states she is requesting a refill to hold her over until next physical appointment 07/14/2015

## 2015-07-13 ENCOUNTER — Encounter: Payer: 59 | Admitting: Family Medicine

## 2015-07-14 ENCOUNTER — Encounter: Payer: 59 | Admitting: Family Medicine

## 2015-07-20 ENCOUNTER — Telehealth: Payer: Self-pay | Admitting: Family Medicine

## 2015-07-20 NOTE — Telephone Encounter (Signed)
Caller name: self  Can be reached: [phone] Pharmacy:  Reason for call: Request refill on FLUoxetine (PROZAC) 20 MG capsule [id number]

## 2015-07-25 ENCOUNTER — Encounter: Payer: Self-pay | Admitting: Family Medicine

## 2015-07-25 ENCOUNTER — Ambulatory Visit (INDEPENDENT_AMBULATORY_CARE_PROVIDER_SITE_OTHER): Payer: 59 | Admitting: Family Medicine

## 2015-07-25 ENCOUNTER — Other Ambulatory Visit: Payer: Self-pay

## 2015-07-25 VITALS — BP 108/76 | HR 67 | Temp 98.2°F | Ht 64.0 in | Wt 160.8 lb

## 2015-07-25 DIAGNOSIS — F329 Major depressive disorder, single episode, unspecified: Secondary | ICD-10-CM

## 2015-07-25 DIAGNOSIS — F32A Depression, unspecified: Secondary | ICD-10-CM

## 2015-07-25 MED ORDER — FLUOXETINE HCL 20 MG PO CAPS: 20.0000 mg | ORAL_CAPSULE | Freq: Every day | ORAL | Status: DC

## 2015-07-25 NOTE — Patient Instructions (Signed)
Follow up as scheduled w/ Dr Charlett Blake Continue the Prozac daily Try and incorporate regular exercise- this will boost mood Call with any questions or concerns Happy Valentine's Day!!!

## 2015-07-25 NOTE — Progress Notes (Signed)
   Subjective:    Patient ID: Cindy [name], female    DOB: 1950/07/26, 65 y.o.   MRN: [id number]  HPI Depression- chronic problem, currently on Prozac 20mg  daily.  Admits to some down days.  Pt reports work and home are going well.  No difficulty sleeping.  No difficulty eating.  No regular exercise.  No N/V, dizziness, numbness/tingling.   Review of Systems For ROS see HPI     Objective:   Physical Exam  Constitutional: She is oriented to person, place, and time. She appears well-developed and well-nourished. No distress.  HENT:  Head: Normocephalic and atraumatic.  Eyes: Conjunctivae and EOM are normal. Pupils are equal, round, and reactive to light.  Neck: Normal range of motion. Neck supple. No thyromegaly present.  Cardiovascular: Normal rate, regular rhythm, normal heart sounds and intact distal pulses.   No murmur heard. Pulmonary/Chest: Effort normal and breath sounds normal. No respiratory distress.  Abdominal: Soft. She exhibits no distension. There is no tenderness.  Musculoskeletal: She exhibits no edema.  Lymphadenopathy:    She has no cervical adenopathy.  Neurological: She is alert and oriented to person, place, and time.  Skin: Skin is warm and dry.  Psychiatric: She has a normal mood and affect. Her behavior is normal.  Vitals reviewed.         Assessment & Plan:

## 2015-07-25 NOTE — Progress Notes (Signed)
Pre visit review using our clinic review tool, if applicable. No additional management support is needed unless otherwise documented below in the visit note. 

## 2015-07-25 NOTE — Assessment & Plan Note (Signed)
Pt's sxs remain well controlled on Prozac.  Denies side effects.  Not interested in adjusting medication dose.  Discussed importance of healthy diet and regular exercise in boosting mood.  Will follow.

## 2015-11-25 ENCOUNTER — Telehealth: Payer: Self-pay

## 2015-11-25 NOTE — Telephone Encounter (Signed)
Called patient to complete Pre Visit Information. Left message for call back.

## 2015-11-28 ENCOUNTER — Encounter: Payer: 59 | Admitting: Family Medicine

## 2015-11-28 ENCOUNTER — Telehealth: Payer: Self-pay | Admitting: Family Medicine

## 2015-11-28 NOTE — Telephone Encounter (Signed)
No charge. 

## 2015-11-28 NOTE — Telephone Encounter (Signed)
Pt called in at 9:05 stating that she will not be able to make her appt today with Dr. Charlett Blake. She says that she had a conflict to come up. Pt rescheduled to 6/30

## 2015-11-29 ENCOUNTER — Encounter: Payer: Self-pay | Admitting: Family Medicine

## 2015-12-09 ENCOUNTER — Encounter: Payer: Self-pay | Admitting: Family Medicine

## 2015-12-09 ENCOUNTER — Other Ambulatory Visit: Payer: Self-pay | Admitting: Family Medicine

## 2015-12-09 ENCOUNTER — Ambulatory Visit (INDEPENDENT_AMBULATORY_CARE_PROVIDER_SITE_OTHER): Payer: 59 | Admitting: Family Medicine

## 2015-12-09 VITALS — BP 98/62 | HR 83 | Temp 98.7°F | Ht 64.0 in | Wt 161.1 lb

## 2015-12-09 DIAGNOSIS — Z0001 Encounter for general adult medical examination with abnormal findings: Secondary | ICD-10-CM | POA: Diagnosis not present

## 2015-12-09 DIAGNOSIS — G43909 Migraine, unspecified, not intractable, without status migrainosus: Secondary | ICD-10-CM

## 2015-12-09 DIAGNOSIS — Z1239 Encounter for other screening for malignant neoplasm of breast: Secondary | ICD-10-CM

## 2015-12-09 DIAGNOSIS — R7309 Other abnormal glucose: Secondary | ICD-10-CM | POA: Diagnosis not present

## 2015-12-09 DIAGNOSIS — Z0000 Encounter for general adult medical examination without abnormal findings: Secondary | ICD-10-CM

## 2015-12-09 DIAGNOSIS — R3129 Other microscopic hematuria: Secondary | ICD-10-CM | POA: Diagnosis not present

## 2015-12-09 DIAGNOSIS — F32A Depression, unspecified: Secondary | ICD-10-CM

## 2015-12-09 DIAGNOSIS — F329 Major depressive disorder, single episode, unspecified: Secondary | ICD-10-CM

## 2015-12-09 DIAGNOSIS — J309 Allergic rhinitis, unspecified: Secondary | ICD-10-CM

## 2015-12-09 DIAGNOSIS — Z8619 Personal history of other infectious and parasitic diseases: Secondary | ICD-10-CM | POA: Insufficient documentation

## 2015-12-09 DIAGNOSIS — H519 Unspecified disorder of binocular movement: Secondary | ICD-10-CM

## 2015-12-09 DIAGNOSIS — Z1231 Encounter for screening mammogram for malignant neoplasm of breast: Secondary | ICD-10-CM

## 2015-12-09 LAB — COMPREHENSIVE METABOLIC PANEL WITH GFR
ALT: 13 U/L (ref 0–35)
AST: 13 U/L (ref 0–37)
Albumin: 4.4 g/dL (ref 3.5–5.2)
Alkaline Phosphatase: 65 U/L (ref 39–117)
BUN: 25 mg/dL — ABNORMAL HIGH (ref 6–23)
CO2: 32 meq/L (ref 19–32)
Calcium: 9.9 mg/dL (ref 8.4–10.5)
Chloride: 105 meq/L (ref 96–112)
Creatinine, Ser: 0.85 mg/dL (ref 0.40–1.20)
GFR: 71.65 mL/min
Glucose, Bld: 111 mg/dL — ABNORMAL HIGH (ref 70–99)
Potassium: 5.2 meq/L — ABNORMAL HIGH (ref 3.5–5.1)
Sodium: 141 meq/L (ref 135–145)
Total Bilirubin: 0.3 mg/dL (ref 0.2–1.2)
Total Protein: 7.6 g/dL (ref 6.0–8.3)

## 2015-12-09 LAB — URINALYSIS, ROUTINE W REFLEX MICROSCOPIC
BILIRUBIN URINE: NEGATIVE
Ketones, ur: NEGATIVE
Leukocytes, UA: NEGATIVE
Nitrite: NEGATIVE
PH: 5.5 (ref 5.0–8.0)
Specific Gravity, Urine: 1.025 (ref 1.000–1.030)
TOTAL PROTEIN, URINE-UPE24: NEGATIVE
URINE GLUCOSE: NEGATIVE
UROBILINOGEN UA: 0.2 (ref 0.0–1.0)

## 2015-12-09 LAB — LIPID PANEL
CHOLESTEROL: 163 mg/dL (ref 0–200)
HDL: 61.7 mg/dL (ref >39.00)
LDL CALC: 89 mg/dL (ref 0–99)
NonHDL: 101.73
TRIGLYCERIDES: 66 mg/dL (ref 0.0–149.0)
Total CHOL/HDL Ratio: 3
VLDL: 13.2 mg/dL (ref 0.0–40.0)

## 2015-12-09 LAB — CBC
HEMATOCRIT: 40.2 % (ref 36.0–46.0)
HEMOGLOBIN: 13.7 g/dL (ref 12.0–15.0)
MCHC: 34 g/dL (ref 30.0–36.0)
MCV: 90.4 fl (ref 78.0–100.0)
PLATELETS: 255 10*3/uL (ref 150.0–400.0)
RBC: 4.44 Mil/uL (ref 3.87–5.11)
RDW: 13.1 % (ref 11.5–15.5)
WBC: 6.4 10*3/uL (ref 4.0–10.5)

## 2015-12-09 LAB — TSH: TSH: 1.24 u[IU]/mL (ref 0.35–4.50)

## 2015-12-09 LAB — HEMOGLOBIN A1C: HEMOGLOBIN A1C: 5.9 % (ref 4.6–6.5)

## 2015-12-09 MED ORDER — FLUOXETINE HCL 20 MG PO CAPS: 40.0000 mg | ORAL_CAPSULE | Freq: Every day | ORAL | Status: DC

## 2015-12-09 MED ORDER — FLUOXETINE HCL 20 MG PO CAPS: 20.0000 mg | ORAL_CAPSULE | Freq: Every day | ORAL | Status: DC

## 2015-12-09 NOTE — Assessment & Plan Note (Signed)
Mild, tolerable symptoms. No daily meds at this time. Try Cetirizine prn

## 2015-12-09 NOTE — Assessment & Plan Note (Signed)
minimize simple carbs. Increase exercise as tolerated.  

## 2015-12-09 NOTE — Assessment & Plan Note (Signed)
H/o orbital palsy causing roaming S/p surgery, doing well now Surgery by Dr Everitt Amber  [facility] now

## 2015-12-09 NOTE — Progress Notes (Signed)
Patient ID: Cindy [name], female   DOB: 1951-03-31, 65 y.o.   MRN: [id number]   Subjective:    Patient ID: Cindy [name], female    DOB: August 29, 1950, 65 y.o.   MRN: [id number]  Chief Complaint  Patient presents with  . Annual Exam    HPI Patient is in today for annual exam and to transfer care to this physician. She is physically doing well. No recent illness or recent hospitalizations. She is noting a several month history of worsening depression. She notes anhedonia and fatigue but denies suicidal ideation. Denies CP/palp/SOB/HA/congestion/fevers/GI or GU c/o. Taking meds as prescribed  Past Medical History  Diagnosis Date  . Depression     ? if bipolar works the best  . Allergy   . Hx of migraines   . Elevated fasting blood sugar     hx  . Microscopic hematuria     advised to see urology ? never went  . History of chicken pox   . H/O measles     Past Surgical History  Procedure Laterality Date  . Endometrial polyps      2 surgeries by Dr Cathlean Cower  . Eye surgery  04/29/2009    Rt eye tighten up    Family History  Problem Relation Age of Onset  . Alzheimer's disease Mother   . Cancer Mother     colon  . Thyroid disease Father   . COPD Father   . Cancer Father     bladder with cystectomy  . Hyperlipidemia Maternal Uncle   . Dementia Maternal Grandmother   . Cancer Maternal Grandfather     colon  . Mental retardation Paternal Grandmother     Social History   Social History  . Marital Status: Single    Spouse Name: N/A  . Number of Children: N/A  . Years of Education: N/A   Occupational History  . Not on file.   Social History Main Topics  . Smoking status: Former Research scientist (life sciences)  . Smokeless tobacco: Not on file  . Alcohol Use: Yes  . Drug Use: No  . Sexual Activity: Not on file   Other Topics Concern  . Not on file   Social History Narrative   HH of 1   2 Cats   Caffeine 2 mugs   Exercise recently some, no major dietary restrictions at this  time. Was vegan now eats chicken and poultry   Works at [employer] in [place] Prior to Visit  Medication Sig Dispense Refill  . FLUoxetine (PROZAC) 20 MG capsule Take 1 capsule (20 mg total) by mouth daily. 30 capsule 3   No facility-administered medications prior to visit.    Allergies  Allergen Reactions  . Bromfed   . Venlafaxine     Review of Systems  Constitutional: Negative for fever, chills and malaise/fatigue.  HENT: Negative for congestion and hearing loss.   Eyes: Negative for discharge.  Respiratory: Negative for cough, sputum production and shortness of breath.   Cardiovascular: Negative for chest pain, palpitations and leg swelling.  Gastrointestinal: Negative for heartburn, nausea, vomiting, abdominal pain, diarrhea, constipation and blood in stool.  Genitourinary: Negative for dysuria, urgency, frequency and hematuria.  Musculoskeletal: Negative for myalgias, back pain and falls.  Skin: Negative for rash.  Neurological: Negative for dizziness, sensory change, loss of consciousness, weakness and headaches.  Endo/Heme/Allergies: Negative for environmental allergies. Does not bruise/bleed easily.  Psychiatric/Behavioral: Positive for depression. Negative for suicidal ideas. The patient is  nervous/anxious. The patient does not have insomnia.        Objective:    Physical Exam  Constitutional: She is oriented to person, place, and time. She appears well-developed and well-nourished. No distress.  HENT:  Head: Normocephalic and atraumatic.  Eyes: Conjunctivae are normal.  Neck: Neck supple. No thyromegaly present.  Cardiovascular: Normal rate, regular rhythm and normal heart sounds.   No murmur heard. Pulmonary/Chest: Effort normal and breath sounds normal. No respiratory distress.  Abdominal: Soft. Bowel sounds are normal. She exhibits no distension and no mass. There is no tenderness.  Genitourinary: Guaiac negative stool.    Musculoskeletal: She exhibits no edema.  Lymphadenopathy:    She has no cervical adenopathy.  Neurological: She is alert and oriented to person, place, and time.  Skin: Skin is warm and dry.  Psychiatric: She has a normal mood and affect. Her behavior is normal.    BP 98/62 mmHg  Pulse 83  Temp(Src) 98.7 F (37.1 C) (Oral)  Ht 5\' 4"  (1.626 m)  Wt 161 lb 2 oz (73.086 kg)  BMI 27.64 kg/m2  SpO2 97% Wt Readings from Last 3 Encounters:  12/09/15 161 lb 2 oz (73.086 kg)  07/25/15 160 lb 12.8 oz (72.938 kg)  07/08/14 151 lb 6 oz (68.663 kg)     Lab Results  Component Value Date   WBC 5.7 07/08/2014   HGB 13.4 07/08/2014   HCT 38.8 07/08/2014   PLT 248.0 07/08/2014   GLUCOSE 98 07/08/2014   CHOL 167 07/08/2014   TRIG 57.0 07/08/2014   HDL 68.30 07/08/2014   LDLDIRECT 123.1 06/25/2011   LDLCALC 87 07/08/2014   ALT 12 07/08/2014   AST 14 07/08/2014   NA 140 07/08/2014   K 4.5 07/08/2014   CL 105 07/08/2014   CREATININE 0.89 07/08/2014   BUN 16 07/08/2014   CO2 28 07/08/2014   TSH 1.42 07/08/2014   HGBA1C 5.8 04/04/2009    Lab Results  Component Value Date   TSH 1.42 07/08/2014   Lab Results  Component Value Date   WBC 5.7 07/08/2014   HGB 13.4 07/08/2014   HCT 38.8 07/08/2014   MCV 89.9 07/08/2014   PLT 248.0 07/08/2014   Lab Results  Component Value Date   NA 140 07/08/2014   K 4.5 07/08/2014   CO2 28 07/08/2014   GLUCOSE 98 07/08/2014   BUN 16 07/08/2014   CREATININE 0.89 07/08/2014   BILITOT 0.4 07/08/2014   ALKPHOS 63 07/08/2014   AST 14 07/08/2014   ALT 12 07/08/2014   PROT 7.0 07/08/2014   ALBUMIN 4.2 07/08/2014   CALCIUM 9.6 07/08/2014   GFR 68.26 07/08/2014   Lab Results  Component Value Date   CHOL 167 07/08/2014   Lab Results  Component Value Date   HDL 68.30 07/08/2014   Lab Results  Component Value Date   LDLCALC 87 07/08/2014   Lab Results  Component Value Date   TRIG 57.0 07/08/2014   Lab Results  Component Value Date    CHOLHDL 2 07/08/2014   Lab Results  Component Value Date   HGBA1C 5.8 04/04/2009       Assessment & Plan:   Problem List Items Addressed This Visit    Physical exam    Patient encouraged to maintain heart healthy diet, regular exercise, adequate sleep. Consider daily probiotics. Take medications as prescribed. Given and reviewed copy of ACP documents from [employer] and encouraged to complete and return  Relevant Medications   FLUoxetine (PROZAC) 20 MG capsule   Other Relevant Orders   CBC   TSH   Comprehensive metabolic panel   Urinalysis   Lipid panel   Hemoglobin A1c   HIV antibody (with reflex)   Hepatitis C Antibody   Migraine headache    Much less frequent since passing her 50s.. Now just milder tension and sinus headaches. Encouraged increased hydration, 64 ounces of clear fluids daily. Minimize alcohol and caffeine. Eat small frequent meals with lean proteins and complex carbs. Avoid high and low blood sugars. Get adequate sleep, 7-8 hours a night. Needs exercise daily preferably in the morning.       Relevant Medications   FLUoxetine (PROZAC) 20 MG capsule   Other Relevant Orders   CBC   TSH   Comprehensive metabolic panel   Urinalysis   Lipid panel   Hemoglobin A1c   MICROSCOPIC HEMATURIA    Repeat UA today asymptomic      Relevant Medications   FLUoxetine (PROZAC) 20 MG capsule   Other Relevant Orders   CBC   TSH   Comprehensive metabolic panel   Urinalysis   Lipid panel   Hemoglobin A1c   HYPERGLYCEMIA     minimize simple carbs. Increase exercise as tolerated.       Relevant Medications   FLUoxetine (PROZAC) 20 MG capsule   Other Relevant Orders   CBC   TSH   Comprehensive metabolic panel   Urinalysis   Lipid panel   Hemoglobin A1c   Disorder of eye movements    H/o orbital palsy causing roaming S/p surgery, doing well now Surgery by Dr Everitt Amber  [facility] now      Relevant Medications   FLUoxetine  (PROZAC) 20 MG capsule   Other Relevant Orders   CBC   TSH   Comprehensive metabolic panel   Urinalysis   Lipid panel   Hemoglobin A1c   Depression - Primary    Doing worse recently but denies any recent new stressors. Encouraged to exercise and eat a clean diet. Regular sleep. Has been on Fluoxetine at 20 mg for a long time. After discussing with patient will increase to 40 mg daily and reassess      Relevant Medications   FLUoxetine (PROZAC) 20 MG capsule   Other Relevant Orders   CBC   TSH   Comprehensive metabolic panel   Urinalysis   Lipid panel   Hemoglobin A1c   Allergic rhinitis    Mild, tolerable symptoms. No daily meds at this time. Try Cetirizine prn      Relevant Medications   FLUoxetine (PROZAC) 20 MG capsule   Other Relevant Orders   CBC   TSH   Comprehensive metabolic panel   Urinalysis   Lipid panel   Hemoglobin A1c      I am having Ms. Freund maintain her FLUoxetine.  Meds ordered this encounter  Medications  . FLUoxetine (PROZAC) 20 MG capsule    Sig: Take 1 capsule (20 mg total) by mouth daily.    Dispense:  90 capsule    Refill:  3     Penni Homans, MD

## 2015-12-09 NOTE — Patient Instructions (Signed)
Preventive Care for Adults, Female A healthy lifestyle and preventive care can promote health and wellness. Preventive health guidelines for women include the following key practices.  A routine yearly physical is a good way to check with your health care provider about your health and preventive screening. It is a chance to share any concerns and updates on your health and to receive a thorough exam.  Visit your dentist for a routine exam and preventive care every 6 months. Brush your teeth twice a day and floss once a day. Good oral hygiene prevents tooth decay and gum disease.  The frequency of eye exams is based on your age, health, family medical history, use of contact lenses, and other factors. Follow your health care provider's recommendations for frequency of eye exams.  Eat a healthy diet. Foods like vegetables, fruits, whole grains, low-fat dairy products, and lean protein foods contain the nutrients you need without too many calories. Decrease your intake of foods high in solid fats, added sugars, and salt. Eat the right amount of calories for you.Get information about a proper diet from your health care provider, if necessary.  Regular physical exercise is one of the most important things you can do for your health. Most adults should get at least 150 minutes of moderate-intensity exercise (any activity that increases your heart rate and causes you to sweat) each week. In addition, most adults need muscle-strengthening exercises on 2 or more days a week.  Maintain a healthy weight. The body mass index (BMI) is a screening tool to identify possible weight problems. It provides an estimate of body fat based on height and weight. Your health care provider can find your BMI and can help you achieve or maintain a healthy weight.For adults 20 years and older:  A BMI below 18.5 is considered underweight.  A BMI of 18.5 to 24.9 is normal.  A BMI of 25 to 29.9 is considered overweight.  A  BMI of 30 and above is considered obese.  Maintain normal blood lipids and cholesterol levels by exercising and minimizing your intake of saturated fat. Eat a balanced diet with plenty of fruit and vegetables. Blood tests for lipids and cholesterol should begin at age 45 and be repeated every 5 years. If your lipid or cholesterol levels are high, you are over 50, or you are at high risk for heart disease, you may need your cholesterol levels checked more frequently.Ongoing high lipid and cholesterol levels should be treated with medicines if diet and exercise are not working.  If you smoke, find out from your health care provider how to quit. If you do not use tobacco, do not start.  Lung cancer screening is recommended for adults aged 45-80 years who are at high risk for developing lung cancer because of a history of smoking. A yearly low-dose CT scan of the lungs is recommended for people who have at least a 30-pack-year history of smoking and are a current smoker or have quit within the past 15 years. A pack year of smoking is smoking an average of 1 pack of cigarettes a day for 1 year (for example: 1 pack a day for 30 years or 2 packs a day for 15 years). Yearly screening should continue until the smoker has stopped smoking for at least 15 years. Yearly screening should be stopped for people who develop a health problem that would prevent them from having lung cancer treatment.  If you are pregnant, do not drink alcohol. If you are  breastfeeding, be very cautious about drinking alcohol. If you are not pregnant and choose to drink alcohol, do not have more than 1 drink per day. One drink is considered to be 12 ounces (355 mL) of beer, 5 ounces (148 mL) of wine, or 1.5 ounces (44 mL) of liquor.  Avoid use of street drugs. Do not share needles with anyone. Ask for help if you need support or instructions about stopping the use of drugs.  High blood pressure causes heart disease and increases the risk  of stroke. Your blood pressure should be checked at least every 1 to 2 years. Ongoing high blood pressure should be treated with medicines if weight loss and exercise do not work.  If you are 55-79 years old, ask your health care provider if you should take aspirin to prevent strokes.  Diabetes screening is done by taking a blood sample to check your blood glucose level after you have not eaten for a certain period of time (fasting). If you are not overweight and you do not have risk factors for diabetes, you should be screened once every 3 years starting at age 45. If you are overweight or obese and you are 40-70 years of age, you should be screened for diabetes every year as part of your cardiovascular risk assessment.  Breast cancer screening is essential preventive care for women. You should practice "breast self-awareness." This means understanding the normal appearance and feel of your breasts and may include breast self-examination. Any changes detected, no matter how small, should be reported to a health care provider. Women in their 20s and 30s should have a clinical breast exam (CBE) by a health care provider as part of a regular health exam every 1 to 3 years. After age 40, women should have a CBE every year. Starting at age 40, women should consider having a mammogram (breast X-ray test) every year. Women who have a family history of breast cancer should talk to their health care provider about genetic screening. Women at a high risk of breast cancer should talk to their health care providers about having an MRI and a mammogram every year.  Breast cancer gene (BRCA)-related cancer risk assessment is recommended for women who have family members with BRCA-related cancers. BRCA-related cancers include breast, ovarian, tubal, and peritoneal cancers. Having family members with these cancers may be associated with an increased risk for harmful changes (mutations) in the breast cancer genes BRCA1 and  BRCA2. Results of the assessment will determine the need for genetic counseling and BRCA1 and BRCA2 testing.  Your health care provider may recommend that you be screened regularly for cancer of the pelvic organs (ovaries, uterus, and vagina). This screening involves a pelvic examination, including checking for microscopic changes to the surface of your cervix (Pap test). You may be encouraged to have this screening done every 3 years, beginning at age 21.  For women ages 30-65, health care providers may recommend pelvic exams and Pap testing every 3 years, or they may recommend the Pap and pelvic exam, combined with testing for human papilloma virus (HPV), every 5 years. Some types of HPV increase your risk of cervical cancer. Testing for HPV may also be done on women of any age with unclear Pap test results.  Other health care providers may not recommend any screening for nonpregnant women who are considered low risk for pelvic cancer and who do not have symptoms. Ask your health care provider if a screening pelvic exam is right for   you.  If you have had past treatment for cervical cancer or a condition that could lead to cancer, you need Pap tests and screening for cancer for at least 20 years after your treatment. If Pap tests have been discontinued, your risk factors (such as having a new sexual partner) need to be reassessed to determine if screening should resume. Some women have medical problems that increase the chance of getting cervical cancer. In these cases, your health care provider may recommend more frequent screening and Pap tests.  Colorectal cancer can be detected and often prevented. Most routine colorectal cancer screening begins at the age of 50 years and continues through age 75 years. However, your health care provider may recommend screening at an earlier age if you have risk factors for colon cancer. On a yearly basis, your health care provider may provide home test kits to check  for hidden blood in the stool. Use of a small camera at the end of a tube, to directly examine the colon (sigmoidoscopy or colonoscopy), can detect the earliest forms of colorectal cancer. Talk to your health care provider about this at age 50, when routine screening begins. Direct exam of the colon should be repeated every 5-10 years through age 75 years, unless early forms of precancerous polyps or small growths are found.  People who are at an increased risk for hepatitis B should be screened for this virus. You are considered at high risk for hepatitis B if:  You were born in a country where hepatitis B occurs often. Talk with your health care provider about which countries are considered high risk.  Your parents were born in a high-risk country and you have not received a shot to protect against hepatitis B (hepatitis B vaccine).  You have HIV or AIDS.  You use needles to inject street drugs.  You live with, or have sex with, someone who has hepatitis B.  You get hemodialysis treatment.  You take certain medicines for conditions like cancer, organ transplantation, and autoimmune conditions.  Hepatitis C blood testing is recommended for all people born from 1945 through 1965 and any individual with known risks for hepatitis C.  Practice safe sex. Use condoms and avoid high-risk sexual practices to reduce the spread of sexually transmitted infections (STIs). STIs include gonorrhea, chlamydia, syphilis, trichomonas, herpes, HPV, and human immunodeficiency virus (HIV). Herpes, HIV, and HPV are viral illnesses that have no cure. They can result in disability, cancer, and death.  You should be screened for sexually transmitted illnesses (STIs) including gonorrhea and chlamydia if:  You are sexually active and are younger than 24 years.  You are older than 24 years and your health care provider tells you that you are at risk for this type of infection.  Your sexual activity has changed  since you were last screened and you are at an increased risk for chlamydia or gonorrhea. Ask your health care provider if you are at risk.  If you are at risk of being infected with HIV, it is recommended that you take a prescription medicine daily to prevent HIV infection. This is called preexposure prophylaxis (PrEP). You are considered at risk if:  You are sexually active and do not regularly use condoms or know the HIV status of your partner(s).  You take drugs by injection.  You are sexually active with a partner who has HIV.  Talk with your health care provider about whether you are at high risk of being infected with HIV. If   you choose to begin PrEP, you should first be tested for HIV. You should then be tested every 3 months for as long as you are taking PrEP.  Osteoporosis is a disease in which the bones lose minerals and strength with aging. This can result in serious bone fractures or breaks. The risk of osteoporosis can be identified using a bone density scan. Women ages 67 years and over and women at risk for fractures or osteoporosis should discuss screening with their health care providers. Ask your health care provider whether you should take a calcium supplement or vitamin D to reduce the rate of osteoporosis.  Menopause can be associated with physical symptoms and risks. Hormone replacement therapy is available to decrease symptoms and risks. You should talk to your health care provider about whether hormone replacement therapy is right for you.  Use sunscreen. Apply sunscreen liberally and repeatedly throughout the day. You should seek shade when your shadow is shorter than you. Protect yourself by wearing long sleeves, pants, a wide-brimmed hat, and sunglasses year round, whenever you are outdoors.  Once a month, do a whole body skin exam, using a mirror to look at the skin on your back. Tell your health care provider of new moles, moles that have irregular borders, moles that  are larger than a pencil eraser, or moles that have changed in shape or color.  Stay current with required vaccines (immunizations).  Influenza vaccine. All adults should be immunized every year.  Tetanus, diphtheria, and acellular pertussis (Td, Tdap) vaccine. Pregnant women should receive 1 dose of Tdap vaccine during each pregnancy. The dose should be obtained regardless of the length of time since the last dose. Immunization is preferred during the 27th-36th week of gestation. An adult who has not previously received Tdap or who does not know her vaccine status should receive 1 dose of Tdap. This initial dose should be followed by tetanus and diphtheria toxoids (Td) booster doses every 10 years. Adults with an unknown or incomplete history of completing a 3-dose immunization series with Td-containing vaccines should begin or complete a primary immunization series including a Tdap dose. Adults should receive a Td booster every 10 years.  Varicella vaccine. An adult without evidence of immunity to varicella should receive 2 doses or a second dose if she has previously received 1 dose. Pregnant females who do not have evidence of immunity should receive the first dose after pregnancy. This first dose should be obtained before leaving the health care facility. The second dose should be obtained 4-8 weeks after the first dose.  Human papillomavirus (HPV) vaccine. Females aged 13-26 years who have not received the vaccine previously should obtain the 3-dose series. The vaccine is not recommended for use in pregnant females. However, pregnancy testing is not needed before receiving a dose. If a female is found to be pregnant after receiving a dose, no treatment is needed. In that case, the remaining doses should be delayed until after the pregnancy. Immunization is recommended for any person with an immunocompromised condition through the age of 61 years if she did not get any or all doses earlier. During the  3-dose series, the second dose should be obtained 4-8 weeks after the first dose. The third dose should be obtained 24 weeks after the first dose and 16 weeks after the second dose.  Zoster vaccine. One dose is recommended for adults aged 30 years or older unless certain conditions are present.  Measles, mumps, and rubella (MMR) vaccine. Adults born  before 1957 generally are considered immune to measles and mumps. Adults born in 1957 or later should have 1 or more doses of MMR vaccine unless there is a contraindication to the vaccine or there is laboratory evidence of immunity to each of the three diseases. A routine second dose of MMR vaccine should be obtained at least 28 days after the first dose for students attending postsecondary schools, health care workers, or international travelers. People who received inactivated measles vaccine or an unknown type of measles vaccine during 1963-1967 should receive 2 doses of MMR vaccine. People who received inactivated mumps vaccine or an unknown type of mumps vaccine before 1979 and are at high risk for mumps infection should consider immunization with 2 doses of MMR vaccine. For females of childbearing age, rubella immunity should be determined. If there is no evidence of immunity, females who are not pregnant should be vaccinated. If there is no evidence of immunity, females who are pregnant should delay immunization until after pregnancy. Unvaccinated health care workers born before 1957 who lack laboratory evidence of measles, mumps, or rubella immunity or laboratory confirmation of disease should consider measles and mumps immunization with 2 doses of MMR vaccine or rubella immunization with 1 dose of MMR vaccine.  Pneumococcal 13-valent conjugate (PCV13) vaccine. When indicated, a person who is uncertain of his immunization history and has no record of immunization should receive the PCV13 vaccine. All adults 65 years of age and older should receive this  vaccine. An adult aged 19 years or older who has certain medical conditions and has not been previously immunized should receive 1 dose of PCV13 vaccine. This PCV13 should be followed with a dose of pneumococcal polysaccharide (PPSV23) vaccine. Adults who are at high risk for pneumococcal disease should obtain the PPSV23 vaccine at least 8 weeks after the dose of PCV13 vaccine. Adults older than 65 years of age who have normal immune system function should obtain the PPSV23 vaccine dose at least 1 year after the dose of PCV13 vaccine.  Pneumococcal polysaccharide (PPSV23) vaccine. When PCV13 is also indicated, PCV13 should be obtained first. All adults aged 65 years and older should be immunized. An adult younger than age 65 years who has certain medical conditions should be immunized. Any person who resides in a nursing home or long-term care facility should be immunized. An adult smoker should be immunized. People with an immunocompromised condition and certain other conditions should receive both PCV13 and PPSV23 vaccines. People with human immunodeficiency virus (HIV) infection should be immunized as soon as possible after diagnosis. Immunization during chemotherapy or radiation therapy should be avoided. Routine use of PPSV23 vaccine is not recommended for American Indians, [place] Natives, or people younger than 65 years unless there are medical conditions that require PPSV23 vaccine. When indicated, people who have unknown immunization and have no record of immunization should receive PPSV23 vaccine. One-time revaccination 5 years after the first dose of PPSV23 is recommended for people aged 19-64 years who have chronic kidney failure, nephrotic syndrome, asplenia, or immunocompromised conditions. People who received 1-2 doses of PPSV23 before age 65 years should receive another dose of PPSV23 vaccine at age 65 years or later if at least 5 years have passed since the previous dose. Doses of PPSV23 are not  needed for people immunized with PPSV23 at or after age 65 years.  Meningococcal vaccine. Adults with asplenia or persistent complement component deficiencies should receive 2 doses of quadrivalent meningococcal conjugate (MenACWY-D) vaccine. The doses should be obtained   at least 2 months apart. Microbiologists working with certain meningococcal bacteria, [place] recruits, people at risk during an outbreak, and people who travel to or live in countries with a high rate of meningitis should be immunized. A first-year college student up through age 34 years who is living in a residence hall should receive a dose if she did not receive a dose on or after her 16th birthday. Adults who have certain high-risk conditions should receive one or more doses of vaccine.  Hepatitis A vaccine. Adults who wish to be protected from this disease, have certain high-risk conditions, work with hepatitis A-infected animals, work in hepatitis A research labs, or travel to or work in countries with a high rate of hepatitis A should be immunized. Adults who were previously unvaccinated and who anticipate close contact with an international adoptee during the first 60 days after arrival in the [place] from a country with a high rate of hepatitis A should be immunized.  Hepatitis B vaccine. Adults who wish to be protected from this disease, have certain high-risk conditions, may be exposed to blood or other infectious body fluids, are household contacts or sex partners of hepatitis B positive people, are clients or workers in certain care facilities, or travel to or work in countries with a high rate of hepatitis B should be immunized.  Haemophilus influenzae type b (Hib) vaccine. A previously unvaccinated person with asplenia or sickle cell disease or having a scheduled splenectomy should receive 1 dose of Hib vaccine. Regardless of previous immunization, a recipient of a hematopoietic stem cell transplant should receive a  3-dose series 6-12 months after her successful transplant. Hib vaccine is not recommended for adults with HIV infection. Preventive Services / Frequency Ages 35 to 4 years  Blood pressure check.** / Every 3-5 years.  Lipid and cholesterol check.** / Every 5 years beginning at age 60.  Clinical breast exam.** / Every 3 years for women in their 71s and 10s.  BRCA-related cancer risk assessment.** / For women who have family members with a BRCA-related cancer (breast, ovarian, tubal, or peritoneal cancers).  Pap test.** / Every 2 years from ages 76 through 26. Every 3 years starting at age 61 through age 76 or 93 with a history of 3 consecutive normal Pap tests.  HPV screening.** / Every 3 years from ages 37 through ages 60 to 51 with a history of 3 consecutive normal Pap tests.  Hepatitis C blood test.** / For any individual with known risks for hepatitis C.  Skin self-exam. / Monthly.  Influenza vaccine. / Every year.  Tetanus, diphtheria, and acellular pertussis (Tdap, Td) vaccine.** / Consult your health care provider. Pregnant women should receive 1 dose of Tdap vaccine during each pregnancy. 1 dose of Td every 10 years.  Varicella vaccine.** / Consult your health care provider. Pregnant females who do not have evidence of immunity should receive the first dose after pregnancy.  HPV vaccine. / 3 doses over 6 months, if 93 and younger. The vaccine is not recommended for use in pregnant females. However, pregnancy testing is not needed before receiving a dose.  Measles, mumps, rubella (MMR) vaccine.** / You need at least 1 dose of MMR if you were born in 1957 or later. You may also need a 2nd dose. For females of childbearing age, rubella immunity should be determined. If there is no evidence of immunity, females who are not pregnant should be vaccinated. If there is no evidence of immunity, females who are  pregnant should delay immunization until after pregnancy.  Pneumococcal  13-valent conjugate (PCV13) vaccine.** / Consult your health care provider.  Pneumococcal polysaccharide (PPSV23) vaccine.** / 1 to 2 doses if you smoke cigarettes or if you have certain conditions.  Meningococcal vaccine.** / 1 dose if you are age 68 to 8 years and a Market researcher living in a residence hall, or have one of several medical conditions, you need to get vaccinated against meningococcal disease. You may also need additional booster doses.  Hepatitis A vaccine.** / Consult your health care provider.  Hepatitis B vaccine.** / Consult your health care provider.  Haemophilus influenzae type b (Hib) vaccine.** / Consult your health care provider. Ages 7 to 53 years  Blood pressure check.** / Every year.  Lipid and cholesterol check.** / Every 5 years beginning at age 25 years.  Lung cancer screening. / Every year if you are aged 11-80 years and have a 30-pack-year history of smoking and currently smoke or have quit within the past 15 years. Yearly screening is stopped once you have quit smoking for at least 15 years or develop a health problem that would prevent you from having lung cancer treatment.  Clinical breast exam.** / Every year after age 48 years.  BRCA-related cancer risk assessment.** / For women who have family members with a BRCA-related cancer (breast, ovarian, tubal, or peritoneal cancers).  Mammogram.** / Every year beginning at age 41 years and continuing for as long as you are in good health. Consult with your health care provider.  Pap test.** / Every 3 years starting at age 65 years through age 37 or 70 years with a history of 3 consecutive normal Pap tests.  HPV screening.** / Every 3 years from ages 72 years through ages 60 to 40 years with a history of 3 consecutive normal Pap tests.  Fecal occult blood test (FOBT) of stool. / Every year beginning at age 21 years and continuing until age 5 years. You may not need to do this test if you get  a colonoscopy every 10 years.  Flexible sigmoidoscopy or colonoscopy.** / Every 5 years for a flexible sigmoidoscopy or every 10 years for a colonoscopy beginning at age 35 years and continuing until age 48 years.  Hepatitis C blood test.** / For all people born from [birth year] through 1965 and any individual with known risks for hepatitis C.  Skin self-exam. / Monthly.  Influenza vaccine. / Every year.  Tetanus, diphtheria, and acellular pertussis (Tdap/Td) vaccine.** / Consult your health care provider. Pregnant women should receive 1 dose of Tdap vaccine during each pregnancy. 1 dose of Td every 10 years.  Varicella vaccine.** / Consult your health care provider. Pregnant females who do not have evidence of immunity should receive the first dose after pregnancy.  Zoster vaccine.** / 1 dose for adults aged 30 years or older.  Measles, mumps, rubella (MMR) vaccine.** / You need at least 1 dose of MMR if you were born in 1957 or later. You may also need a second dose. For females of childbearing age, rubella immunity should be determined. If there is no evidence of immunity, females who are not pregnant should be vaccinated. If there is no evidence of immunity, females who are pregnant should delay immunization until after pregnancy.  Pneumococcal 13-valent conjugate (PCV13) vaccine.** / Consult your health care provider.  Pneumococcal polysaccharide (PPSV23) vaccine.** / 1 to 2 doses if you smoke cigarettes or if you have certain conditions.  Meningococcal vaccine.** /  Consult your health care provider.  Hepatitis A vaccine.** / Consult your health care provider.  Hepatitis B vaccine.** / Consult your health care provider.  Haemophilus influenzae type b (Hib) vaccine.** / Consult your health care provider. Ages 64 years and over  Blood pressure check.** / Every year.  Lipid and cholesterol check.** / Every 5 years beginning at age 23 years.  Lung cancer screening. / Every year if you  are aged 16-80 years and have a 30-pack-year history of smoking and currently smoke or have quit within the past 15 years. Yearly screening is stopped once you have quit smoking for at least 15 years or develop a health problem that would prevent you from having lung cancer treatment.  Clinical breast exam.** / Every year after age 74 years.  BRCA-related cancer risk assessment.** / For women who have family members with a BRCA-related cancer (breast, ovarian, tubal, or peritoneal cancers).  Mammogram.** / Every year beginning at age 44 years and continuing for as long as you are in good health. Consult with your health care provider.  Pap test.** / Every 3 years starting at age 58 years through age 22 or 39 years with 3 consecutive normal Pap tests. Testing can be stopped between 65 and 70 years with 3 consecutive normal Pap tests and no abnormal Pap or HPV tests in the past 10 years.  HPV screening.** / Every 3 years from ages 64 years through ages 70 or 61 years with a history of 3 consecutive normal Pap tests. Testing can be stopped between 65 and 70 years with 3 consecutive normal Pap tests and no abnormal Pap or HPV tests in the past 10 years.  Fecal occult blood test (FOBT) of stool. / Every year beginning at age 40 years and continuing until age 27 years. You may not need to do this test if you get a colonoscopy every 10 years.  Flexible sigmoidoscopy or colonoscopy.** / Every 5 years for a flexible sigmoidoscopy or every 10 years for a colonoscopy beginning at age 7 years and continuing until age 32 years.  Hepatitis C blood test.** / For all people born from [birth year] through 1965 and any individual with known risks for hepatitis C.  Osteoporosis screening.** / A one-time screening for women ages 30 years and over and women at risk for fractures or osteoporosis.  Skin self-exam. / Monthly.  Influenza vaccine. / Every year.  Tetanus, diphtheria, and acellular pertussis (Tdap/Td)  vaccine.** / 1 dose of Td every 10 years.  Varicella vaccine.** / Consult your health care provider.  Zoster vaccine.** / 1 dose for adults aged 35 years or older.  Pneumococcal 13-valent conjugate (PCV13) vaccine.** / Consult your health care provider.  Pneumococcal polysaccharide (PPSV23) vaccine.** / 1 dose for all adults aged 46 years and older.  Meningococcal vaccine.** / Consult your health care provider.  Hepatitis A vaccine.** / Consult your health care provider.  Hepatitis B vaccine.** / Consult your health care provider.  Haemophilus influenzae type b (Hib) vaccine.** / Consult your health care provider. ** Family history and personal history of risk and conditions may change your health care provider's recommendations.   This information is not intended to replace advice given to you by your health care provider. Make sure you discuss any questions you have with your health care provider.   Document Released: 07/24/2001 Document Revised: 06/18/2014 Document Reviewed: 10/23/2010 Elsevier Interactive Patient Education [employer].

## 2015-12-09 NOTE — Assessment & Plan Note (Signed)
Patient encouraged to maintain heart healthy diet, regular exercise, adequate sleep. Consider daily probiotics. Take medications as prescribed. Given and reviewed copy of ACP documents from [place] Secretary of State and encouraged to complete and return 

## 2015-12-09 NOTE — Assessment & Plan Note (Addendum)
Much less frequent since passing her 15s.. Now just milder tension and sinus headaches. Encouraged increased hydration, 64 ounces of clear fluids daily. Minimize alcohol and caffeine. Eat small frequent meals with lean proteins and complex carbs. Avoid high and low blood sugars. Get adequate sleep, 7-8 hours a night. Needs exercise daily preferably in the morning.

## 2015-12-09 NOTE — Assessment & Plan Note (Signed)
Doing worse recently but denies any recent new stressors. Encouraged to exercise and eat a clean diet. Regular sleep. Has been on Fluoxetine at 20 mg for a long time. After discussing with patient will increase to 40 mg daily and reassess

## 2015-12-09 NOTE — Progress Notes (Signed)
Pre visit review using our clinic review tool, if applicable. No additional management support is needed unless otherwise documented below in the visit note. 

## 2015-12-09 NOTE — Assessment & Plan Note (Signed)
Repeat UA today asymptomic

## 2015-12-10 LAB — HEPATITIS C ANTIBODY: HCV AB: NEGATIVE

## 2015-12-10 LAB — HIV ANTIBODY (ROUTINE TESTING W REFLEX): HIV: NONREACTIVE

## 2015-12-12 ENCOUNTER — Ambulatory Visit (HOSPITAL_BASED_OUTPATIENT_CLINIC_OR_DEPARTMENT_OTHER): Payer: 59

## 2015-12-12 ENCOUNTER — Ambulatory Visit (HOSPITAL_BASED_OUTPATIENT_CLINIC_OR_DEPARTMENT_OTHER)
Admission: RE | Admit: 2015-12-12 | Discharge: 2015-12-12 | Disposition: A | Discharge status: Home / Self Care | Payer: 59 | Source: Ambulatory Visit | Attending: Family Medicine | Admitting: Family Medicine

## 2015-12-12 ENCOUNTER — Other Ambulatory Visit: Payer: Self-pay | Admitting: Family Medicine

## 2015-12-12 DIAGNOSIS — Z1231 Encounter for screening mammogram for malignant neoplasm of breast: Secondary | ICD-10-CM | POA: Diagnosis not present

## 2015-12-12 DIAGNOSIS — E875 Hyperkalemia: Secondary | ICD-10-CM

## 2015-12-26 ENCOUNTER — Other Ambulatory Visit (INDEPENDENT_AMBULATORY_CARE_PROVIDER_SITE_OTHER): Payer: 59

## 2015-12-26 DIAGNOSIS — E875 Hyperkalemia: Secondary | ICD-10-CM

## 2015-12-26 LAB — COMPREHENSIVE METABOLIC PANEL
ALBUMIN: 4.2 g/dL (ref 3.5–5.2)
ALK PHOS: 57 U/L (ref 39–117)
ALT: 15 U/L (ref 0–35)
AST: 14 U/L (ref 0–37)
BILIRUBIN TOTAL: 0.4 mg/dL (ref 0.2–1.2)
BUN: 22 mg/dL (ref 6–23)
CALCIUM: 9.6 mg/dL (ref 8.4–10.5)
CO2: 30 mEq/L (ref 19–32)
CREATININE: 0.83 mg/dL (ref 0.40–1.20)
Chloride: 104 mEq/L (ref 96–112)
GFR: 73.64 mL/min (ref >60.00)
Glucose, Bld: 102 mg/dL — ABNORMAL HIGH (ref 70–99)
Potassium: 5 mEq/L (ref 3.5–5.1)
SODIUM: 138 meq/L (ref 135–145)
TOTAL PROTEIN: 7.3 g/dL (ref 6.0–8.3)

## 2016-01-02 ENCOUNTER — Other Ambulatory Visit: Payer: 59

## 2016-01-25 ENCOUNTER — Telehealth: Payer: Self-pay | Admitting: Family Medicine

## 2016-01-25 NOTE — Telephone Encounter (Signed)
Pt called in to request a Rx for Xanax .     Pharmacy: [facility]: [phone] -

## 2016-01-26 MED ORDER — ALPRAZOLAM 0.25 MG PO TABS: 0.2500 mg | ORAL_TABLET | Freq: Two times a day (BID) | ORAL | 0 refills | Status: DC | PRN

## 2016-01-26 MED FILL — ALPRAZolam 0.25 MG TABS: 0.25 | 10 days supply | Qty: 20 | Fill #0

## 2016-01-26 NOTE — Telephone Encounter (Signed)
Do not see where she has ever taken this med before. Has she? What does she need it for and does she feel she needs to see [place]? Then I can consider prescribing.

## 2016-01-26 NOTE — Telephone Encounter (Signed)
She just needs to understand this is a controlled substance so she cannot take it and drive or operate machinery. It is habituating if used in strong doses for a period of time so she needs to use sparingly. That being said I will rx Alprazolam 0.25 mg tabs 1 tab po bid prn anxiety. #20 once til seen

## 2016-01-26 NOTE — Telephone Encounter (Signed)
Printed and patient informed of PCP instructions regarding this medication. Faxed hardcopy to the [employer].

## 2016-01-26 NOTE — Telephone Encounter (Signed)
This is not a refill request. Patient is requesting a prescription for this (to be used when necessary, her next appt. Is 03/12/2016). Patient did not address this at her appt. But would like to try only as needed.  She would not give me details just was personal.

## 2016-02-20 DIAGNOSIS — H52221 Regular astigmatism, right eye: Secondary | ICD-10-CM | POA: Diagnosis not present

## 2016-02-20 DIAGNOSIS — H5211 Myopia, right eye: Secondary | ICD-10-CM | POA: Diagnosis not present

## 2016-02-20 DIAGNOSIS — H524 Presbyopia: Secondary | ICD-10-CM | POA: Diagnosis not present

## 2016-02-20 DIAGNOSIS — H52222 Regular astigmatism, left eye: Secondary | ICD-10-CM | POA: Diagnosis not present

## 2016-03-12 ENCOUNTER — Ambulatory Visit (INDEPENDENT_AMBULATORY_CARE_PROVIDER_SITE_OTHER): Payer: 59 | Admitting: Family Medicine

## 2016-03-12 ENCOUNTER — Encounter: Payer: Self-pay | Admitting: Family Medicine

## 2016-03-12 DIAGNOSIS — F32A Depression, unspecified: Secondary | ICD-10-CM

## 2016-03-12 DIAGNOSIS — F329 Major depressive disorder, single episode, unspecified: Secondary | ICD-10-CM

## 2016-03-12 MED ORDER — FLUOXETINE HCL 40 MG PO CAPS: 40.0000 mg | ORAL_CAPSULE | Freq: Every day | ORAL | 1 refills | Status: DC

## 2016-03-12 NOTE — Progress Notes (Signed)
Patient ID: Cindy [name], female   DOB: [birth year], 65 y.o.   MRN: [id number]   Subjective:    Patient ID: Cindy [name], female    DOB: 1950/09/28, 65 y.o.   MRN: [id number]  Chief Complaint  Patient presents with  . Follow-up    HPI Patient is in today for follow up. She is feeling well without any new concerns. No recent hospitalizations or acute illness. The increase in the fluoxetine has helped a great deal. She has not needed any xanax, no anhedonia. Denies CP/palp/SOB/HA/congestion/fevers/GI or GU c/o. Taking meds as prescribed  Past Medical History:  Diagnosis Date  . Allergy   . Depression    ? if bipolar works the best  . Elevated fasting blood sugar    hx  . H/O measles   . History of chicken pox   . Hx of migraines   . Microscopic hematuria    advised to see urology ? never went    Past Surgical History:  Procedure Laterality Date  . endometrial polyps     2 surgeries by Dr Cathlean Cower  . EYE SURGERY  04/29/2009   Rt eye tighten up    Family History  Problem Relation Age of Onset  . Alzheimer's disease Mother   . Cancer Mother     colon  . Thyroid disease Father   . COPD Father   . Cancer Father     bladder with cystectomy  . Hyperlipidemia Maternal Uncle   . Dementia Maternal Grandmother   . Cancer Maternal Grandfather     colon  . Mental retardation Paternal Grandmother     Social History   Social History  . Marital status: Single    Spouse name: N/A  . Number of children: N/A  . Years of education: N/A   Occupational History  . Not on file.   Social History Main Topics  . Smoking status: Former Research scientist (life sciences)  . Smokeless tobacco: Not on file  . Alcohol use Yes  . Drug use: No  . Sexual activity: Not on file   Other Topics Concern  . Not on file   Social History Narrative   HH of 1   2 Cats   Caffeine 2 mugs   Exercise recently some, no major dietary restrictions at this time. Was vegan now eats chicken and poultry   Works at  [employer] in [place] [employer]    Outpatient Medications Prior to Visit  Medication Sig Dispense Refill  . ALPRAZolam (XANAX) 0.25 MG tablet Take 1 tablet (0.25 mg total) by mouth 2 (two) times daily as needed for anxiety. 20 tablet 0  . FLUoxetine (PROZAC) 20 MG capsule Take 2 capsules (40 mg total) by mouth daily. 180 capsule 1   No facility-administered medications prior to visit.     Allergies  Allergen Reactions  . Bromfed   . Venlafaxine     Review of Systems  Constitutional: Negative for fever and malaise/fatigue.  HENT: Negative for congestion.   Eyes: Negative for blurred vision.  Respiratory: Negative for shortness of breath.   Cardiovascular: Negative for chest pain, palpitations and leg swelling.  Gastrointestinal: Negative for abdominal pain, blood in stool and nausea.  Genitourinary: Negative for dysuria and frequency.  Musculoskeletal: Negative for falls.  Skin: Negative for rash.  Neurological: Negative for dizziness, loss of consciousness and headaches.  Endo/Heme/Allergies: Negative for environmental allergies.  Psychiatric/Behavioral: Negative for depression. The patient is not nervous/anxious.        Objective:  Physical Exam  Constitutional: She is oriented to person, place, and time. She appears well-developed and well-nourished. No distress.  HENT:  Head: Normocephalic and atraumatic.  Nose: Nose normal.  Eyes: Right eye exhibits no discharge. Left eye exhibits no discharge.  Neck: Normal range of motion. Neck supple.  Cardiovascular: Normal rate and regular rhythm.   No murmur heard. Pulmonary/Chest: Effort normal and breath sounds normal.  Abdominal: Soft. Bowel sounds are normal. There is no tenderness.  Musculoskeletal: She exhibits no edema.  Neurological: She is alert and oriented to person, place, and time.  Skin: Skin is warm and dry.  Psychiatric: She has a normal mood and affect.  Nursing note and vitals reviewed.   BP (!) 89/55 (BP Location:  Left Arm, Patient Position: Sitting, Cuff Size: Normal)   Pulse 81   Temp 98.5 F (36.9 C) (Oral)   Wt 160 lb 3.2 oz (72.7 kg)   SpO2 98%   BMI 27.50 kg/m  Wt Readings from Last 3 Encounters:  03/12/16 160 lb 3.2 oz (72.7 kg)  12/09/15 161 lb 2 oz (73.1 kg)  07/25/15 160 lb 12.8 oz (72.9 kg)     Lab Results  Component Value Date   WBC 6.4 12/09/2015   HGB 13.7 12/09/2015   HCT 40.2 12/09/2015   PLT 255.0 12/09/2015   GLUCOSE 102 (H) 12/26/2015   CHOL 163 12/09/2015   TRIG 66.0 12/09/2015   HDL 61.70 12/09/2015   LDLDIRECT 123.1 06/25/2011   LDLCALC 89 12/09/2015   ALT 15 12/26/2015   AST 14 12/26/2015   NA 138 12/26/2015   K 5.0 12/26/2015   CL 104 12/26/2015   CREATININE 0.83 12/26/2015   BUN 22 12/26/2015   CO2 30 12/26/2015   TSH 1.24 12/09/2015   HGBA1C 5.9 12/09/2015    Lab Results  Component Value Date   TSH 1.24 12/09/2015   Lab Results  Component Value Date   WBC 6.4 12/09/2015   HGB 13.7 12/09/2015   HCT 40.2 12/09/2015   MCV 90.4 12/09/2015   PLT 255.0 12/09/2015   Lab Results  Component Value Date   NA 138 12/26/2015   K 5.0 12/26/2015   CO2 30 12/26/2015   GLUCOSE 102 (H) 12/26/2015   BUN 22 12/26/2015   CREATININE 0.83 12/26/2015   BILITOT 0.4 12/26/2015   ALKPHOS 57 12/26/2015   AST 14 12/26/2015   ALT 15 12/26/2015   PROT 7.3 12/26/2015   ALBUMIN 4.2 12/26/2015   CALCIUM 9.6 12/26/2015   GFR 73.64 12/26/2015   Lab Results  Component Value Date   CHOL 163 12/09/2015   Lab Results  Component Value Date   HDL 61.70 12/09/2015   Lab Results  Component Value Date   LDLCALC 89 12/09/2015   Lab Results  Component Value Date   TRIG 66.0 12/09/2015   Lab Results  Component Value Date   CHOLHDL 3 12/09/2015   Lab Results  Component Value Date   HGBA1C 5.9 12/09/2015       Assessment & Plan:   Problem List Items Addressed This Visit    Depression    Doing better on Fluoxetine 40 mg daily has not needed Xanax as a  result may continue same. Given refill on Fluoxetine      Relevant Medications   FLUoxetine (PROZAC) 40 MG capsule    Other Visit Diagnoses   None.     I have discontinued Ms. Friesz's FLUoxetine. I am also having her start on FLUoxetine. Additionally,  I am having her maintain her ALPRAZolam.  Meds ordered this encounter  Medications  . FLUoxetine (PROZAC) 40 MG capsule    Sig: Take 1 capsule (40 mg total) by mouth daily.    Dispense:  90 capsule    Refill:  1     Samanthamarie Ezzell, MD

## 2016-03-12 NOTE — Patient Instructions (Signed)
Fluoxetine capsules or tablets (Depression/Mood Disorders) What is this medicine? FLUOXETINE (floo OX e teen) belongs to a class of drugs known as selective serotonin reuptake inhibitors (SSRIs). It helps to treat mood problems such as depression, obsessive compulsive disorder, and panic attacks. It can also treat certain eating disorders. This medicine may be used for other purposes; ask your health care provider or pharmacist if you have questions. What should I tell my health care provider before I take this medicine? They need to know if you have any of these conditions: -bipolar disorder or mania -diabetes -glaucoma -liver disease -psychosis -seizures -suicidal thoughts or history of attempted suicide -an unusual or allergic reaction to fluoxetine, other medicines, foods, dyes, or preservatives -pregnant or trying to get pregnant -breast-feeding How should I use this medicine? Take this medicine by mouth with a glass of water. Follow the directions on the prescription label. You can take this medicine with or without food. Take your medicine at regular intervals. Do not take it more often than directed. Do not stop taking this medicine suddenly except upon the advice of your doctor. Stopping this medicine too quickly may cause serious side effects or your condition may worsen. A special MedGuide will be given to you by the pharmacist with each prescription and refill. Be sure to read this information carefully each time. Talk to your pediatrician regarding the use of this medicine in children. While this drug may be prescribed for children as young as 7 years for selected conditions, precautions do apply. Overdosage: If you think you have taken too much of this medicine contact a poison control center or emergency room at once. NOTE: This medicine is only for you. Do not share this medicine with others. What if I miss a dose? If you miss a dose, skip the missed dose and go back to your  regular dosing schedule. Do not take double or extra doses. What may interact with this medicine? Do not take fluoxetine with any of the following medications: -other medicines containing fluoxetine, like Sarafem or Symbyax -cisapride -linezolid -MAOIs like Carbex, Eldepryl, Marplan, Nardil, and Parnate -methylene blue (injected into a vein) -pimozide -thioridazine This medicine may also interact with the following medications: -alcohol -aspirin and aspirin-like medicines -carbamazepine -certain medicines for depression, anxiety, or psychotic disturbances -certain medicines for migraine headaches like almotriptan, eletriptan, frovatriptan, naratriptan, rizatriptan, sumatriptan, zolmitriptan -digoxin -diuretics -fentanyl -flecainide -furazolidone -isoniazid -lithium -medicines for sleep -medicines that treat or prevent blood clots like warfarin, enoxaparin, and dalteparin -NSAIDs, medicines for pain and inflammation, like ibuprofen or naproxen -phenytoin -procarbazine -propafenone -rasagiline -ritonavir -supplements like St. John's wort, kava kava, valerian -tramadol -tryptophan -vinblastine This list may not describe all possible interactions. Give your health care provider a list of all the medicines, herbs, non-prescription drugs, or dietary supplements you use. Also tell them if you smoke, drink alcohol, or use illegal drugs. Some items may interact with your medicine. What should I watch for while using this medicine? Tell your doctor if your symptoms do not get better or if they get worse. Visit your doctor or health care professional for regular checks on your progress. Because it may take several weeks to see the full effects of this medicine, it is important to continue your treatment as prescribed by your doctor. Patients and their families should watch out for new or worsening thoughts of suicide or depression. Also watch out for sudden changes in feelings such as  feeling anxious, agitated, panicky, irritable, hostile, aggressive, impulsive, severely restless,   overly excited and hyperactive, or not being able to sleep. If this happens, especially at the beginning of treatment or after a change in dose, call your health care professional. You may get drowsy or dizzy. Do not drive, use machinery, or do anything that needs mental alertness until you know how this medicine affects you. Do not stand or sit up quickly, especially if you are an older patient. This reduces the risk of dizzy or fainting spells. Alcohol may interfere with the effect of this medicine. Avoid alcoholic drinks. Your mouth may get dry. Chewing sugarless gum or sucking hard candy, and drinking plenty of water may help. Contact your doctor if the problem does not go away or is severe. This medicine may affect blood sugar levels. If you have diabetes, check with your doctor or health care professional before you change your diet or the dose of your diabetic medicine. What side effects may I notice from receiving this medicine? Side effects that you should report to your doctor or health care professional as soon as possible: -allergic reactions like skin rash, itching or hives, swelling of the face, lips, or tongue -breathing problems -confusion -eye pain, changes in vision -fast or irregular heart rate, palpitations -flu-like fever, chills, cough, muscle or joint aches and pains -seizures -suicidal thoughts or other mood changes -swelling or redness in or around the eye -tremors -trouble sleeping -unusual bleeding or bruising -unusually tired or weak -vomiting Side effects that usually do not require medical attention (report to your doctor or health care professional if they continue or are bothersome): -change in sex drive or performance -diarrhea -dry mouth -flushing -headache -increased or decreased appetite -nausea -sweating This list may not describe all possible side  effects. Call your doctor for medical advice about side effects. You may report side effects to FDA at 1-800-FDA-1088. Where should I keep my medicine? Keep out of the reach of children. Store at room temperature between 15 and 30 degrees C (59 and 86 degrees F). Throw away any unused medicine after the expiration date. NOTE: This sheet is a summary. It may not cover all possible information. If you have questions about this medicine, talk to your doctor, pharmacist, or health care provider.    2016, Elsevier/Gold Standard. (2014-05-21 12:40:07)  

## 2016-03-12 NOTE — Progress Notes (Signed)
Pre visit review using our clinic review tool, if applicable. No additional management support is needed unless otherwise documented below in the visit note. 

## 2016-03-12 NOTE — Assessment & Plan Note (Signed)
Doing better on Fluoxetine 40 mg daily has not needed Xanax as a result may continue same. Given refill on Fluoxetine

## 2016-07-03 ENCOUNTER — Encounter: Payer: Self-pay | Admitting: Gastroenterology

## 2016-08-29 ENCOUNTER — Telehealth: Payer: Self-pay | Admitting: Family Medicine

## 2016-08-29 NOTE — Telephone Encounter (Signed)
error:[id number] ° °

## 2016-08-31 ENCOUNTER — Ambulatory Visit: Payer: 59 | Admitting: Medical

## 2016-09-03 ENCOUNTER — Ambulatory Visit (INDEPENDENT_AMBULATORY_CARE_PROVIDER_SITE_OTHER): Payer: 59 | Admitting: Family

## 2016-09-03 ENCOUNTER — Encounter: Payer: Self-pay | Admitting: Family

## 2016-09-03 VITALS — BP 124/66 | HR 83 | Temp 100.2°F | Resp 18 | Ht 64.0 in | Wt 162.6 lb

## 2016-09-03 DIAGNOSIS — J101 Influenza due to other identified influenza virus with other respiratory manifestations: Secondary | ICD-10-CM | POA: Diagnosis not present

## 2016-09-03 DIAGNOSIS — J029 Acute pharyngitis, unspecified: Secondary | ICD-10-CM

## 2016-09-03 LAB — POC INFLUENZA A&B (BINAX/QUICKVUE)
Influenza A, POC: POSITIVE — AB
Influenza B, POC: NEGATIVE

## 2016-09-03 LAB — POCT RAPID STREP A (OFFICE): RAPID STREP A SCREEN: NEGATIVE

## 2016-09-03 NOTE — Progress Notes (Signed)
Pre visit review using our clinic review tool, if applicable. No additional management support is needed unless otherwise documented below in the visit note. 

## 2016-09-03 NOTE — Patient Instructions (Signed)

## 2016-09-03 NOTE — Progress Notes (Signed)
Subjective:    Patient ID: Cindy [name], female    DOB: 12-19-1950, 66 y.o.   MRN: [id number]  HPI   Ms. Feeser is a 66 yr old female who presents today with chief complaint of sore throat. Symptoms began on Friday.  Has a HA, sore throat, body aches, fever. Reports that her coworker was sick.     Review of Systems See HPI  Past Medical History:  Diagnosis Date  . Allergy   . Depression    ? if bipolar works the best  . Elevated fasting blood sugar    hx  . H/O measles   . History of chicken pox   . Hx of migraines   . Microscopic hematuria    advised to see urology ? never went     Social History   Social History  . Marital status: Single    Spouse name: N/A  . Number of children: N/A  . Years of education: N/A   Occupational History  . Not on file.   Social History Main Topics  . Smoking status: Former Research scientist (life sciences)  . Smokeless tobacco: Never Used  . Alcohol use Yes  . Drug use: No  . Sexual activity: Not on file   Other Topics Concern  . Not on file   Social History Narrative   HH of 1   2 Cats   Caffeine 2 mugs   Exercise recently some, no major dietary restrictions at this time. Was vegan now eats chicken and poultry   Works at [employer] in [place] [employer]    Past Surgical History:  Procedure Laterality Date  . endometrial polyps     2 surgeries by Dr Cathlean Cower  . EYE SURGERY  04/29/2009   Rt eye tighten up    Family History  Problem Relation Age of Onset  . Alzheimer's disease Mother   . Cancer Mother     colon  . Thyroid disease Father   . COPD Father   . Cancer Father     bladder with cystectomy  . Hyperlipidemia Maternal Uncle   . Dementia Maternal Grandmother   . Cancer Maternal Grandfather     colon  . Mental retardation Paternal Grandmother     Allergies  Allergen Reactions  . Bromfed Shortness Of Breath  . Venlafaxine     Doesn't remember reaction    Current Outpatient Prescriptions on File Prior to Visit  Medication Sig  Dispense Refill  . ALPRAZolam (XANAX) 0.25 MG tablet Take 1 tablet (0.25 mg total) by mouth 2 (two) times daily as needed for anxiety. 20 tablet 0  . FLUoxetine (PROZAC) 40 MG capsule Take 1 capsule (40 mg total) by mouth daily. 90 capsule 1   No current facility-administered medications on file prior to visit.     BP 124/66 (BP Location: Right Arm, Cuff Size: Normal)   Pulse 83   Temp 100.2 F (37.9 C) (Oral)   Resp 18   Ht 5\' 4"  (1.626 m)   Wt 162 lb 9.6 oz (73.8 kg)   SpO2 98% Comment: room air  BMI 27.91 kg/m       Objective:   Physical Exam Gen: awake, alert, NAD ENT: TM's normal bilaterally without bulging, erythema Throat, no significant erythema noted Neck: mild cervical LAD noted CV: S1/S2, RRR Resp: BS CTA bilaterally without wheezing. No increased WOB        Assessment & Plan:  Influenza- rapid strep negative. Flu swab positive for flu A. Pt is outside  tamiflu window. Advised patient on supportive measures and to follow up if symptoms worsen or fail to improve.

## 2016-09-10 ENCOUNTER — Ambulatory Visit: Payer: 59 | Admitting: Family Medicine

## 2016-09-21 ENCOUNTER — Encounter: Payer: Self-pay | Admitting: Family Medicine

## 2016-09-21 ENCOUNTER — Ambulatory Visit (INDEPENDENT_AMBULATORY_CARE_PROVIDER_SITE_OTHER): Payer: 59 | Admitting: Family Medicine

## 2016-09-21 DIAGNOSIS — R7309 Other abnormal glucose: Secondary | ICD-10-CM

## 2016-09-21 DIAGNOSIS — F329 Major depressive disorder, single episode, unspecified: Secondary | ICD-10-CM

## 2016-09-21 DIAGNOSIS — F32A Depression, unspecified: Secondary | ICD-10-CM

## 2016-09-21 MED ORDER — FLUOXETINE HCL 40 MG PO CAPS: 40.0000 mg | ORAL_CAPSULE | Freq: Every day | ORAL | 1 refills | Status: DC

## 2016-09-21 NOTE — Progress Notes (Signed)
Pre visit review using our clinic review tool, if applicable. No additional management support is needed unless otherwise documented below in the visit note. 

## 2016-09-21 NOTE — Patient Instructions (Signed)
Zyrtec/Cetirizine 10 mg daily for allergies, consider adding Flonase Zantac/Ranitidine 150 mg at bedtime Above for next month daily and then as needed Plain Mucinex twice daily x 10 days Probiotic, NOW company downstairs at [employer] [place] or online at [email or web address] 64 oz of clear fluids Cough, Adult Coughing is a reflex that clears your throat and your airways. Coughing helps to heal and protect your lungs. It is normal to cough occasionally, but a cough that happens with other symptoms or lasts a long time may be a sign of a condition that needs treatment. A cough may last only 2-3 weeks (acute), or it may last longer than 8 weeks (chronic). What are the causes? Coughing is commonly caused by:  Breathing in substances that irritate your lungs.  A viral or bacterial respiratory infection.  Allergies.  Asthma.  Postnasal drip.  Smoking.  Acid backing up from the stomach into the esophagus (gastroesophageal reflux).  Certain medicines.  Chronic lung problems, including COPD (or rarely, lung cancer).  Other medical conditions such as heart failure. Follow these instructions at home: Pay attention to any changes in your symptoms. Take these actions to help with your discomfort:  Take medicines only as told by your health care provider.  If you were prescribed an antibiotic medicine, take it as told by your health care provider. Do not stop taking the antibiotic even if you start to feel better.  Talk with your health care provider before you take a cough suppressant medicine.  Drink enough fluid to keep your urine clear or pale yellow.  If the air is dry, use a cold steam vaporizer or humidifier in your bedroom or your home to help loosen secretions.  Avoid anything that causes you to cough at work or at home.  If your cough is worse at night, try sleeping in a semi-upright position.  Avoid cigarette smoke. If you smoke, quit smoking. If you need help quitting,  ask your health care provider.  Avoid caffeine.  Avoid alcohol.  Rest as needed. Contact a health care provider if:  You have new symptoms.  You cough up pus.  Your cough does not get better after 2-3 weeks, or your cough gets worse.  You cannot control your cough with suppressant medicines and you are losing sleep.  You develop pain that is getting worse or pain that is not controlled with pain medicines.  You have a fever.  You have unexplained weight loss.  You have night sweats. Get help right away if:  You cough up blood.  You have difficulty breathing.  Your heartbeat is very fast. This information is not intended to replace advice given to you by your health care provider. Make sure you discuss any questions you have with your health care provider. Document Released: 11/24/2010 Document Revised: 11/03/2015 Document Reviewed: 08/04/2014 Elsevier Interactive Patient Education  2017 [employer].

## 2016-09-21 NOTE — Progress Notes (Signed)
Patient ID: Cindy [name], female   DOB: 04-23-1951, 66 y.o.   MRN: [id number]   Subjective:  I acted as a Education administrator for Penni Homans, [place], [place]   Patient ID: Cindy [name], female    DOB: Apr 16, 1951, 66 y.o.   MRN: [id number]  Chief Complaint  Patient presents with  . Follow-up    Medication F/U.    Cough  This is a new problem. The current episode started 1 to 4 weeks ago. The cough is non-productive. Pertinent negatives include no chest pain, fever, headaches, rash or shortness of breath. The symptoms are aggravated by other (Patient recently has the flu.). The treatment provided no relief.    Patient is in today for a medication follow up. Patient states that she is recovering from the Flu; and states that she has a lingering cough for the past 3 weeks. Patient has a Hx of depression, hyperglycemia, migraines. Patient has no additional acute concerns noted at this time. No polyuria or polydipsia. Migraines manageable at this time. Doing well on Fluoxetine, alprazolam is needed very rarely. Denies CP/palp/SOB/HA/congestion/fevers/GI or GU c/o. Taking meds as prescribed  Patient Care Team: Mosie Lukes, MD as PCP - General (Family Medicine) Bennetta Laos, MD (Inactive) (Obstetrics and Gynecology)   Past Medical History:  Diagnosis Date  . Allergy   . Depression    ? if bipolar works the best  . Elevated fasting blood sugar    hx  . H/O measles   . History of chicken pox   . Hx of migraines   . Microscopic hematuria    advised to see urology ? never went    Past Surgical History:  Procedure Laterality Date  . endometrial polyps     2 surgeries by Dr Cathlean Cower  . EYE SURGERY  04/29/2009   Rt eye tighten up    Family History  Problem Relation Age of Onset  . Alzheimer's disease Mother   . Cancer Mother     colon  . Thyroid disease Father   . COPD Father   . Cancer Father     bladder with cystectomy  . Hyperlipidemia Maternal Uncle   . Dementia  Maternal Grandmother   . Cancer Maternal Grandfather     colon  . Mental retardation Paternal Grandmother     Social History   Social History  . Marital status: Single    Spouse name: N/A  . Number of children: N/A  . Years of education: N/A   Occupational History  . Not on file.   Social History Main Topics  . Smoking status: Former Research scientist (life sciences)  . Smokeless tobacco: Never Used  . Alcohol use Yes  . Drug use: No  . Sexual activity: Not on file   Other Topics Concern  . Not on file   Social History Narrative   HH of 1   2 Cats   Caffeine 2 mugs   Exercise recently some, no major dietary restrictions at this time. Was vegan now eats chicken and poultry   Works at [employer] in [employer]    Outpatient Medications Prior to Visit  Medication Sig Dispense Refill  . ALPRAZolam (XANAX) 0.25 MG tablet Take 1 tablet (0.25 mg total) by mouth 2 (two) times daily as needed for anxiety. 20 tablet 0  . FLUoxetine (PROZAC) 40 MG capsule Take 1 capsule (40 mg total) by mouth daily. 90 capsule 1   No facility-administered medications prior to visit.     Allergies  Allergen Reactions  . Bromfed Shortness Of Breath  . Venlafaxine     Doesn't remember reaction    Review of Systems  Constitutional: Negative for fever and malaise/fatigue.  HENT: Negative for congestion.   Eyes: Negative for blurred vision.  Respiratory: Positive for cough. Negative for shortness of breath.        Patient states that she is still recovering from the Flu.  Cardiovascular: Negative for chest pain, palpitations and leg swelling.  Gastrointestinal: Negative for vomiting.  Musculoskeletal: Negative for back pain.  Skin: Negative for rash.  Neurological: Negative for loss of consciousness and headaches.       Objective:    Physical Exam  Constitutional: She is oriented to person, place, and time. She appears well-developed and well-nourished. No distress.  HENT:  Head: Normocephalic and atraumatic.  Eyes:  Conjunctivae are normal.  Neck: Normal range of motion. No thyromegaly present.  Cardiovascular: Normal rate and regular rhythm.   Pulmonary/Chest: Effort normal and breath sounds normal. She has no wheezes.  Abdominal: Soft. Bowel sounds are normal. There is no tenderness.  Musculoskeletal: She exhibits no edema or deformity.  Neurological: She is alert and oriented to person, place, and time.  Skin: Skin is warm and dry. She is not diaphoretic.  Psychiatric: She has a normal mood and affect.    BP (!) 117/58 (BP Location: Left Arm, Patient Position: Sitting, Cuff Size: Normal)   Pulse 73   Temp 98.4 F (36.9 C) (Oral)   Wt 159 lb 9.6 oz (72.4 kg)   SpO2 98% Comment: RA  BMI 27.40 kg/m  Wt Readings from Last 3 Encounters:  09/21/16 159 lb 9.6 oz (72.4 kg)  09/03/16 162 lb 9.6 oz (73.8 kg)  03/12/16 160 lb 3.2 oz (72.7 kg)   BP Readings from Last 3 Encounters:  09/21/16 (!) 117/58  09/03/16 124/66  03/12/16 (!) 89/55     Immunization History  Administered Date(s) Administered  . Influenza,inj,Quad PF,36+ Mos 03/20/2014  . Influenza-Unspecified 03/06/2016  . Td 11/19/2006  . Tdap 12/23/2012    Health Maintenance  Topic Date Due  . PAP SMEAR  06/19/2014  . COLONOSCOPY  08/18/2016  . INFLUENZA VACCINE  01/09/2017  . MAMMOGRAM  12/11/2017  . TETANUS/TDAP  12/24/2022  . Hepatitis C Screening  Completed  . HIV Screening  Completed    Lab Results  Component Value Date   WBC 6.4 12/09/2015   HGB 13.7 12/09/2015   HCT 40.2 12/09/2015   PLT 255.0 12/09/2015   GLUCOSE 102 (H) 12/26/2015   CHOL 163 12/09/2015   TRIG 66.0 12/09/2015   HDL 61.70 12/09/2015   LDLDIRECT 123.1 06/25/2011   LDLCALC 89 12/09/2015   ALT 15 12/26/2015   AST 14 12/26/2015   NA 138 12/26/2015   K 5.0 12/26/2015   CL 104 12/26/2015   CREATININE 0.83 12/26/2015   BUN 22 12/26/2015   CO2 30 12/26/2015   TSH 1.24 12/09/2015   HGBA1C 5.9 12/09/2015    Lab Results  Component Value Date    TSH 1.24 12/09/2015   Lab Results  Component Value Date   WBC 6.4 12/09/2015   HGB 13.7 12/09/2015   HCT 40.2 12/09/2015   MCV 90.4 12/09/2015   PLT 255.0 12/09/2015   Lab Results  Component Value Date   NA 138 12/26/2015   K 5.0 12/26/2015   CO2 30 12/26/2015   GLUCOSE 102 (H) 12/26/2015   BUN 22 12/26/2015   CREATININE 0.83 12/26/2015  BILITOT 0.4 12/26/2015   ALKPHOS 57 12/26/2015   AST 14 12/26/2015   ALT 15 12/26/2015   PROT 7.3 12/26/2015   ALBUMIN 4.2 12/26/2015   CALCIUM 9.6 12/26/2015   GFR 73.64 12/26/2015   Lab Results  Component Value Date   CHOL 163 12/09/2015   Lab Results  Component Value Date   HDL 61.70 12/09/2015   Lab Results  Component Value Date   LDLCALC 89 12/09/2015   Lab Results  Component Value Date   TRIG 66.0 12/09/2015   Lab Results  Component Value Date   CHOLHDL 3 12/09/2015   Lab Results  Component Value Date   HGBA1C 5.9 12/09/2015         Assessment & Plan:   Problem List Items Addressed This Visit    Depression    Good response to Fluoxetine and needs Alprazolam is needed very infrequently.       Relevant Medications   FLUoxetine (PROZAC) 40 MG capsule   HYPERGLYCEMIA    minimize simple carbs. Increase exercise as tolerated.          I am having Ms. Currin maintain her ALPRAZolam and FLUoxetine.  Meds ordered this encounter  Medications  . FLUoxetine (PROZAC) 40 MG capsule    Sig: Take 1 capsule (40 mg total) by mouth daily.    Dispense:  90 capsule    Refill:  1    CMA served as scribe during this visit. History, Physical and Plan performed by medical provider. Documentation and orders reviewed and attested to.  Penni Homans, MD

## 2016-09-23 NOTE — Assessment & Plan Note (Signed)
Good response to Fluoxetine and needs Alprazolam is needed very infrequently.

## 2016-09-23 NOTE — Assessment & Plan Note (Signed)
minimize simple carbs. Increase exercise as tolerated.  

## 2016-12-14 ENCOUNTER — Encounter: Payer: 59 | Admitting: Family Medicine

## 2016-12-21 ENCOUNTER — Encounter: Payer: Self-pay | Admitting: Family Medicine

## 2016-12-21 ENCOUNTER — Ambulatory Visit (INDEPENDENT_AMBULATORY_CARE_PROVIDER_SITE_OTHER): Payer: 59 | Admitting: Family Medicine

## 2016-12-21 VITALS — BP 108/60 | HR 72 | Temp 98.0°F | Ht 64.0 in | Wt 159.1 lb

## 2016-12-21 DIAGNOSIS — Z124 Encounter for screening for malignant neoplasm of cervix: Secondary | ICD-10-CM | POA: Diagnosis not present

## 2016-12-21 DIAGNOSIS — Z1211 Encounter for screening for malignant neoplasm of colon: Secondary | ICD-10-CM | POA: Diagnosis not present

## 2016-12-21 DIAGNOSIS — E2839 Other primary ovarian failure: Secondary | ICD-10-CM

## 2016-12-21 DIAGNOSIS — F32A Depression, unspecified: Secondary | ICD-10-CM

## 2016-12-21 DIAGNOSIS — Z0000 Encounter for general adult medical examination without abnormal findings: Secondary | ICD-10-CM | POA: Diagnosis not present

## 2016-12-21 DIAGNOSIS — F329 Major depressive disorder, single episode, unspecified: Secondary | ICD-10-CM

## 2016-12-21 DIAGNOSIS — R7309 Other abnormal glucose: Secondary | ICD-10-CM | POA: Diagnosis not present

## 2016-12-21 LAB — LIPID PANEL
CHOL/HDL RATIO: 3
Cholesterol: 181 mg/dL (ref 0–200)
HDL: 64.7 mg/dL (ref >39.00)
LDL CALC: 107 mg/dL — AB (ref 0–99)
NonHDL: 115.8
TRIGLYCERIDES: 45 mg/dL (ref 0.0–149.0)
VLDL: 9 mg/dL (ref 0.0–40.0)

## 2016-12-21 LAB — COMPREHENSIVE METABOLIC PANEL
ALBUMIN: 4.1 g/dL (ref 3.5–5.2)
ALT: 14 U/L (ref 0–35)
AST: 15 U/L (ref 0–37)
Alkaline Phosphatase: 51 U/L (ref 39–117)
BILIRUBIN TOTAL: 0.4 mg/dL (ref 0.2–1.2)
BUN: 23 mg/dL (ref 6–23)
CALCIUM: 9.6 mg/dL (ref 8.4–10.5)
CHLORIDE: 103 meq/L (ref 96–112)
CO2: 28 meq/L (ref 19–32)
CREATININE: 0.91 mg/dL (ref 0.40–1.20)
GFR: 66.01 mL/min (ref >60.00)
Glucose, Bld: 101 mg/dL — ABNORMAL HIGH (ref 70–99)
Potassium: 4.1 mEq/L (ref 3.5–5.1)
SODIUM: 139 meq/L (ref 135–145)
Total Protein: 7.2 g/dL (ref 6.0–8.3)

## 2016-12-21 LAB — CBC
HCT: 40.2 % (ref 36.0–46.0)
Hemoglobin: 13.8 g/dL (ref 12.0–15.0)
MCHC: 34.3 g/dL (ref 30.0–36.0)
MCV: 91.2 fl (ref 78.0–100.0)
PLATELETS: 258 10*3/uL (ref 150.0–400.0)
RBC: 4.41 Mil/uL (ref 3.87–5.11)
RDW: 13.4 % (ref 11.5–15.5)
WBC: 6.6 10*3/uL (ref 4.0–10.5)

## 2016-12-21 LAB — HEMOGLOBIN A1C: HEMOGLOBIN A1C: 5.9 % (ref 4.6–6.5)

## 2016-12-21 LAB — TSH: TSH: 1.05 u[IU]/mL (ref 0.35–4.50)

## 2016-12-21 NOTE — Patient Instructions (Addendum)
MIND diet  Shingrix, 2 shots over 6 months. New shingles shots Preventive Care 40-64 Years, Female Preventive care refers to lifestyle choices and visits with your health care provider that can promote health and wellness. What does preventive care include?  A yearly physical exam. This is also called an annual well check.  Dental exams once or twice a year.  Routine eye exams. Ask your health care provider how often you should have your eyes checked.  Personal lifestyle choices, including: ? Daily care of your teeth and gums. ? Regular physical activity. ? Eating a healthy diet. ? Avoiding tobacco and drug use. ? Limiting alcohol use. ? Practicing safe sex. ? Taking low-dose aspirin daily starting at age 58. ? Taking vitamin and mineral supplements as recommended by your health care provider. What happens during an annual well check? The services and screenings done by your health care provider during your annual well check will depend on your age, overall health, lifestyle risk factors, and family history of disease. Counseling Your health care provider may ask you questions about your:  Alcohol use.  Tobacco use.  Drug use.  Emotional well-being.  Home and relationship well-being.  Sexual activity.  Eating habits.  Work and work Statistician.  Method of birth control.  Menstrual cycle.  Pregnancy history.  Screening You may have the following tests or measurements:  Height, weight, and BMI.  Blood pressure.  Lipid and cholesterol levels. These may be checked every 5 years, or more frequently if you are over 57 years old.  Skin check.  Lung cancer screening. You may have this screening every year starting at age 21 if you have a 30-pack-year history of smoking and currently smoke or have quit within the past 15 years.  Fecal occult blood test (FOBT) of the stool. You may have this test every year starting at age 9.  Flexible sigmoidoscopy or  colonoscopy. You may have a sigmoidoscopy every 5 years or a colonoscopy every 10 years starting at age 81.  Hepatitis C blood test.  Hepatitis B blood test.  Sexually transmitted disease (STD) testing.  Diabetes screening. This is done by checking your blood sugar (glucose) after you have not eaten for a while (fasting). You may have this done every 1-3 years.  Mammogram. This may be done every 1-2 years. Talk to your health care provider about when you should start having regular mammograms. This may depend on whether you have a family history of breast cancer.  BRCA-related cancer screening. This may be done if you have a family history of breast, ovarian, tubal, or peritoneal cancers.  Pelvic exam and Pap test. This may be done every 3 years starting at age 73. Starting at age 48, this may be done every 5 years if you have a Pap test in combination with an HPV test.  Bone density scan. This is done to screen for osteoporosis. You may have this scan if you are at high risk for osteoporosis.  Discuss your test results, treatment options, and if necessary, the need for more tests with your health care provider. Vaccines Your health care provider may recommend certain vaccines, such as:  Influenza vaccine. This is recommended every year.  Tetanus, diphtheria, and acellular pertussis (Tdap, Td) vaccine. You may need a Td booster every 10 years.  Varicella vaccine. You may need this if you have not been vaccinated.  Zoster vaccine. You may need this after age 52.  Measles, mumps, and rubella (MMR) vaccine. You may  need at least one dose of MMR if you were born in 1957 or later. You may also need a second dose.  Pneumococcal 13-valent conjugate (PCV13) vaccine. You may need this if you have certain conditions and were not previously vaccinated.  Pneumococcal polysaccharide (PPSV23) vaccine. You may need one or two doses if you smoke cigarettes or if you have certain  conditions.  Meningococcal vaccine. You may need this if you have certain conditions.  Hepatitis A vaccine. You may need this if you have certain conditions or if you travel or work in places where you may be exposed to hepatitis A.  Hepatitis B vaccine. You may need this if you have certain conditions or if you travel or work in places where you may be exposed to hepatitis B.  Haemophilus influenzae type b (Hib) vaccine. You may need this if you have certain conditions.  Talk to your health care provider about which screenings and vaccines you need and how often you need them. This information is not intended to replace advice given to you by your health care provider. Make sure you discuss any questions you have with your health care provider. Document Released: 06/24/2015 Document Revised: 02/15/2016 Document Reviewed: 03/29/2015 Elsevier Interactive Patient Education  2017 [place] DASH stands for "Dietary Approaches to Stop Hypertension." The DASH eating plan is a healthy eating plan that has been shown to reduce high blood pressure (hypertension). It may also reduce your risk for type 2 diabetes, heart disease, and stroke. The DASH eating plan may also help with weight loss. What are tips for following this plan? General guidelines  Avoid eating more than 2,300 mg (milligrams) of salt (sodium) a day. If you have hypertension, you may need to reduce your sodium intake to 1,500 mg a day.  Limit alcohol intake to no more than 1 drink a day for nonpregnant women and 2 drinks a day for men. One drink equals 12 oz of beer, 5 oz of wine, or 1 oz of hard liquor.  Work with your health care provider to maintain a healthy body weight or to lose weight. Ask what an ideal weight is for you.  Get at least 30 minutes of exercise that causes your heart to beat faster (aerobic exercise) most days of the week. Activities may include walking, swimming, or biking.  Work  with your health care provider or diet and nutrition specialist (dietitian) to adjust your eating plan to your individual calorie needs. Reading food labels  Check food labels for the amount of sodium per serving. Choose foods with less than 5 percent of the Daily Value of sodium. Generally, foods with less than 300 mg of sodium per serving fit into this eating plan.  To find whole grains, look for the word "whole" as the first word in the ingredient list. Shopping  Buy products labeled as "low-sodium" or "no salt added."  Buy fresh foods. Avoid canned foods and premade or frozen meals. Cooking  Avoid adding salt when cooking. Use salt-free seasonings or herbs instead of table salt or sea salt. Check with your health care provider or pharmacist before using salt substitutes.  Do not fry foods. Cook foods using healthy methods such as baking, boiling, grilling, and broiling instead.  Cook with heart-healthy oils, such as olive, canola, soybean, or sunflower oil. Meal planning   Eat a balanced diet that includes: ? 5 or more servings of fruits and vegetables each day. At each meal,  try to fill half of your plate with fruits and vegetables. ? Up to 6-8 servings of whole grains each day. ? Less than 6 oz of lean meat, poultry, or fish each day. A 3-oz serving of meat is about the same size as a deck of cards. One egg equals 1 oz. ? 2 servings of low-fat dairy each day. ? A serving of nuts, seeds, or beans 5 times each week. ? Heart-healthy fats. Healthy fats called Omega-3 fatty acids are found in foods such as flaxseeds and coldwater fish, like sardines, salmon, and mackerel.  Limit how much you eat of the following: ? Canned or prepackaged foods. ? Food that is high in trans fat, such as fried foods. ? Food that is high in saturated fat, such as fatty meat. ? Sweets, desserts, sugary drinks, and other foods with added sugar. ? Full-fat dairy products.  Do not salt foods before  eating.  Try to eat at least 2 vegetarian meals each week.  Eat more home-cooked food and less restaurant, buffet, and fast food.  When eating at a restaurant, ask that your food be prepared with less salt or no salt, if possible. What foods are recommended? The items listed may not be a complete list. Talk with your dietitian about what dietary choices are best for you. Grains Whole-grain or whole-wheat bread. Whole-grain or whole-wheat pasta. Brown rice. [place]. Bulgur. Whole-grain and low-sodium cereals. Pita bread. Low-fat, low-sodium crackers. Whole-wheat flour tortillas. Vegetables Fresh or frozen vegetables (raw, steamed, roasted, or grilled). Low-sodium or reduced-sodium tomato and vegetable juice. Low-sodium or reduced-sodium tomato sauce and tomato paste. Low-sodium or reduced-sodium canned vegetables. Fruits All fresh, dried, or frozen fruit. Canned fruit in natural juice (without added sugar). Meat and other protein foods Skinless chicken or [place]. Ground chicken or [place]. Pork with fat trimmed off. Fish and seafood. Egg whites. Dried beans, peas, or lentils. Unsalted nuts, nut butters, and seeds. Unsalted canned beans. Lean cuts of beef with fat trimmed off. Low-sodium, lean deli meat. Dairy Low-fat (1%) or fat-free (skim) milk. Fat-free, low-fat, or reduced-fat cheeses. Nonfat, low-sodium ricotta or cottage cheese. Low-fat or nonfat yogurt. Low-fat, low-sodium cheese. Fats and oils Soft margarine without trans fats. Vegetable oil. Low-fat, reduced-fat, or light mayonnaise and salad dressings (reduced-sodium). Canola, safflower, olive, soybean, and sunflower oils. Avocado. Seasoning and other foods Herbs. Spices. Seasoning mixes without salt. Unsalted popcorn and pretzels. Fat-free sweets. What foods are not recommended? The items listed may not be a complete list. Talk with your dietitian about what dietary choices are best for you. Grains Baked goods made with fat,  such as croissants, muffins, or some breads. Dry pasta or rice meal packs. Vegetables Creamed or fried vegetables. Vegetables in a cheese sauce. Regular canned vegetables (not low-sodium or reduced-sodium). Regular canned tomato sauce and paste (not low-sodium or reduced-sodium). Regular tomato and vegetable juice (not low-sodium or reduced-sodium). Angie Fava. Olives. Fruits Canned fruit in a light or heavy syrup. Fried fruit. Fruit in cream or butter sauce. Meat and other protein foods Fatty cuts of meat. Ribs. Fried meat. Berniece Salines. Sausage. Bologna and other processed lunch meats. Salami. Fatback. Hotdogs. Bratwurst. Salted nuts and seeds. Canned beans with added salt. Canned or smoked fish. Whole eggs or egg yolks. Chicken or [place] with skin. Dairy Whole or 2% milk, cream, and half-and-half. Whole or full-fat cream cheese. Whole-fat or sweetened yogurt. Full-fat cheese. Nondairy creamers. Whipped toppings. Processed cheese and cheese spreads. Fats and oils Butter. Stick margarine. Lard. Shortening. Ghee.  Bacon fat. Tropical oils, such as coconut, palm kernel, or palm oil. Seasoning and other foods Salted popcorn and pretzels. Onion salt, garlic salt, seasoned salt, table salt, and sea salt. Worcestershire sauce. Tartar sauce. Barbecue sauce. Teriyaki sauce. Soy sauce, including reduced-sodium. Steak sauce. Canned and packaged gravies. Fish sauce. Oyster sauce. Cocktail sauce. Horseradish that you find on the shelf. Ketchup. Mustard. Meat flavorings and tenderizers. Bouillon cubes. Hot sauce and Tabasco sauce. Premade or packaged marinades. Premade or packaged taco seasonings. Relishes. Regular salad dressings. Where to find more information:  National Heart, Lung, and [place]: https://wilson-eaton.com/  American Heart Association: www.heart.org Summary  The DASH eating plan is a healthy eating plan that has been shown to reduce high blood pressure (hypertension). It may also reduce your risk for  type 2 diabetes, heart disease, and stroke.  With the DASH eating plan, you should limit salt (sodium) intake to 2,300 mg a day. If you have hypertension, you may need to reduce your sodium intake to 1,500 mg a day.  When on the DASH eating plan, aim to eat more fresh fruits and vegetables, whole grains, lean proteins, low-fat dairy, and heart-healthy fats.  Work with your health care provider or diet and nutrition specialist (dietitian) to adjust your eating plan to your individual calorie needs. This information is not intended to replace advice given to you by your health care provider. Make sure you discuss any questions you have with your health care provider. Document Released: 05/17/2011 Document Revised: 05/21/2016 Document Reviewed: 05/21/2016 Elsevier Interactive Patient Education  2017 [employer].

## 2016-12-21 NOTE — Progress Notes (Signed)
Pre visit review using our clinic review tool, if applicable. No additional management support is needed unless otherwise documented below in the visit note. 

## 2016-12-23 ENCOUNTER — Encounter: Payer: Self-pay | Admitting: Family Medicine

## 2016-12-23 NOTE — Assessment & Plan Note (Addendum)
Patient encouraged to maintain heart healthy diet, regular exercise, adequate sleep. Consider daily probiotics. Take medications as prescribed. Referred to gastroenterology for colonoscopy. Dexa scan ordered and she agrees to proceed with MGM. Labs reviewed.

## 2016-12-23 NOTE — Progress Notes (Signed)
Patient ID: Cindy [name], female   DOB: 1950/12/09, 66 y.o.   MRN: [id number]    Subjective:    Patient ID: Cindy [name], female    DOB: Oct 15, 1950, 66 y.o.   MRN: [id number]  Chief Complaint  Patient presents with  . Annual Exam    HPI Patient is in today for annual exam. She feels well most days but she does endorse a headache today secondary to no food or drink today. She has occasional reflux but it is not significant nor does it need medical attention when it occurs. She is happy with the results of Fluoxetine and she has not needed any Alprazolam since her last visit. Denies CP/palp/SOB/HA/congestion/fevers or GU c/o. Taking meds as prescribed  Past Medical History:  Diagnosis Date  . Allergy   . Depression    ? if bipolar works the best  . Elevated fasting blood sugar    hx  . H/O measles   . History of chicken pox   . Hx of migraines   . Microscopic hematuria    advised to see urology ? never went  . Preventative health care 06/20/2011    Past Surgical History:  Procedure Laterality Date  . endometrial polyps     2 surgeries by Dr Cathlean Cower  . EYE SURGERY  04/29/2009   Rt eye tighten up    Family History  Problem Relation Age of Onset  . Alzheimer's disease Mother   . Cancer Mother        colon  . Thyroid disease Father   . COPD Father   . Cancer Father        bladder with cystectomy  . Dementia Maternal Grandmother   . Cancer Maternal Grandfather        colon  . Mental retardation Paternal Grandmother   . Hyperlipidemia Maternal Uncle     Social History   Social History  . Marital status: Single    Spouse name: N/A  . Number of children: N/A  . Years of education: N/A   Occupational History  . Not on file.   Social History Main Topics  . Smoking status: Former Research scientist (life sciences)  . Smokeless tobacco: Never Used  . Alcohol use Yes  . Drug use: No  . Sexual activity: Not on file   Other Topics Concern  . Not on file   Social History Narrative    HH of 1   2 Cats   Caffeine 2 mugs   Exercise recently some, no major dietary restrictions at this time. Was vegan now eats chicken and poultry   Works at [employer] in [employer]    Outpatient Medications Prior to Visit  Medication Sig Dispense Refill  . ALPRAZolam (XANAX) 0.25 MG tablet Take 1 tablet (0.25 mg total) by mouth 2 (two) times daily as needed for anxiety. 20 tablet 0  . FLUoxetine (PROZAC) 40 MG capsule Take 1 capsule (40 mg total) by mouth daily. 90 capsule 1   No facility-administered medications prior to visit.     Allergies  Allergen Reactions  . Bromfed Shortness Of Breath  . Venlafaxine     Doesn't remember reaction    Review of Systems  Constitutional: Negative for chills, fever and malaise/fatigue.  HENT: Negative for congestion and hearing loss.   Eyes: Negative for discharge.  Respiratory: Negative for cough, sputum production and shortness of breath.   Cardiovascular: Negative for chest pain, palpitations and leg swelling.  Gastrointestinal: Negative for abdominal pain, blood in stool,  constipation, diarrhea, heartburn, nausea and vomiting.  Genitourinary: Negative for dysuria, frequency, hematuria and urgency.  Musculoskeletal: Negative for back pain, falls and myalgias.  Skin: Negative for rash.  Neurological: Negative.  Negative for dizziness, sensory change, loss of consciousness, weakness and headaches.  Endo/Heme/Allergies: Negative for environmental allergies. Does not bruise/bleed easily.  Psychiatric/Behavioral: Negative for depression and suicidal ideas. The patient is not nervous/anxious and does not have insomnia.        Objective:    Physical Exam  Constitutional: She is oriented to person, place, and time. She appears well-developed and well-nourished. No distress.  HENT:  Head: Normocephalic and atraumatic.  Eyes: Conjunctivae are normal.  Neck: Neck supple. No thyromegaly present.  Cardiovascular: Normal rate, regular rhythm and normal  heart sounds.   No murmur heard. Pulmonary/Chest: Effort normal and breath sounds normal. No respiratory distress.  Abdominal: Soft. Bowel sounds are normal. She exhibits no distension and no mass. There is no tenderness.  Musculoskeletal: She exhibits no edema.  Lymphadenopathy:    She has no cervical adenopathy.  Neurological: She is alert and oriented to person, place, and time.  Skin: Skin is warm and dry.  Psychiatric: She has a normal mood and affect. Her behavior is normal.    BP 108/60 (BP Location: Left Arm, Patient Position: Sitting, Cuff Size: Normal)   Pulse 72   Temp 98 F (36.7 C) (Oral)   Ht 5\' 4"  (1.626 m)   Wt 159 lb 2 oz (72.2 kg)   SpO2 97%   BMI 27.31 kg/m  Wt Readings from Last 3 Encounters:  12/21/16 159 lb 2 oz (72.2 kg)  09/21/16 159 lb 9.6 oz (72.4 kg)  09/03/16 162 lb 9.6 oz (73.8 kg)     Lab Results  Component Value Date   WBC 6.6 12/21/2016   HGB 13.8 12/21/2016   HCT 40.2 12/21/2016   PLT 258.0 12/21/2016   GLUCOSE 101 (H) 12/21/2016   CHOL 181 12/21/2016   TRIG 45.0 12/21/2016   HDL 64.70 12/21/2016   LDLDIRECT 123.1 06/25/2011   LDLCALC 107 (H) 12/21/2016   ALT 14 12/21/2016   AST 15 12/21/2016   NA 139 12/21/2016   K 4.1 12/21/2016   CL 103 12/21/2016   CREATININE 0.91 12/21/2016   BUN 23 12/21/2016   CO2 28 12/21/2016   TSH 1.05 12/21/2016   HGBA1C 5.9 12/21/2016    Lab Results  Component Value Date   TSH 1.05 12/21/2016   Lab Results  Component Value Date   WBC 6.6 12/21/2016   HGB 13.8 12/21/2016   HCT 40.2 12/21/2016   MCV 91.2 12/21/2016   PLT 258.0 12/21/2016   Lab Results  Component Value Date   NA 139 12/21/2016   K 4.1 12/21/2016   CO2 28 12/21/2016   GLUCOSE 101 (H) 12/21/2016   BUN 23 12/21/2016   CREATININE 0.91 12/21/2016   BILITOT 0.4 12/21/2016   ALKPHOS 51 12/21/2016   AST 15 12/21/2016   ALT 14 12/21/2016   PROT 7.2 12/21/2016   ALBUMIN 4.1 12/21/2016   CALCIUM 9.6 12/21/2016   GFR 66.01  12/21/2016   Lab Results  Component Value Date   CHOL 181 12/21/2016   Lab Results  Component Value Date   HDL 64.70 12/21/2016   Lab Results  Component Value Date   LDLCALC 107 (H) 12/21/2016   Lab Results  Component Value Date   TRIG 45.0 12/21/2016   Lab Results  Component Value Date   CHOLHDL 3  12/21/2016   Lab Results  Component Value Date   HGBA1C 5.9 12/21/2016       Assessment & Plan:   Problem List Items Addressed This Visit    Depression    Doing very well on Fluoxetine and has not needed Alprazolam      HYPERGLYCEMIA    hgba1c acceptable, minimize simple carbs. Increase exercise as tolerated.       Relevant Orders   Hemoglobin A1c (Completed)   CBC (Completed)   Preventative health care - Primary    Patient encouraged to maintain heart healthy diet, regular exercise, adequate sleep. Consider daily probiotics. Take medications as prescribed. Referred to gastroenterology for colonoscopy. Dexa scan ordered and she agrees to proceed with MGM. Labs reviewed.       Other Visit Diagnoses    Estrogen deficiency       Relevant Orders   DG Bone Density   CBC (Completed)   Cervical cancer screening       Relevant Orders   Ambulatory referral to Obstetrics / Gynecology   CBC (Completed)   Colon cancer screening       Relevant Orders   Ambulatory referral to Gastroenterology   CBC (Completed)      I am having Ms. Mallet maintain her ALPRAZolam and FLUoxetine.  No orders of the defined types were placed in this encounter.   CMA served as Education administrator during this visit. History, Physical and Plan performed by medical provider. Documentation and orders reviewed and attested to.  Penni Homans, MD

## 2016-12-23 NOTE — Assessment & Plan Note (Signed)
hgba1c acceptable, minimize simple carbs. Increase exercise as tolerated.  

## 2016-12-23 NOTE — Assessment & Plan Note (Signed)
Doing very well on Fluoxetine and has not needed Alprazolam

## 2017-01-04 ENCOUNTER — Ambulatory Visit (HOSPITAL_BASED_OUTPATIENT_CLINIC_OR_DEPARTMENT_OTHER): Payer: 59

## 2017-01-14 ENCOUNTER — Encounter: Payer: Self-pay | Admitting: Family Medicine

## 2017-01-24 ENCOUNTER — Encounter: Payer: Self-pay | Admitting: Family Medicine

## 2017-01-28 ENCOUNTER — Ambulatory Visit (HOSPITAL_BASED_OUTPATIENT_CLINIC_OR_DEPARTMENT_OTHER)
Admission: RE | Admit: 2017-01-28 | Discharge: 2017-01-28 | Disposition: A | Discharge status: Home / Self Care | Payer: 59 | Source: Ambulatory Visit | Attending: Family Medicine | Admitting: Family Medicine

## 2017-01-28 DIAGNOSIS — E2839 Other primary ovarian failure: Secondary | ICD-10-CM | POA: Diagnosis not present

## 2017-01-28 DIAGNOSIS — Z0000 Encounter for general adult medical examination without abnormal findings: Secondary | ICD-10-CM | POA: Diagnosis not present

## 2017-01-28 DIAGNOSIS — Z1231 Encounter for screening mammogram for malignant neoplasm of breast: Secondary | ICD-10-CM | POA: Insufficient documentation

## 2017-01-28 DIAGNOSIS — Z1239 Encounter for other screening for malignant neoplasm of breast: Secondary | ICD-10-CM

## 2017-01-28 DIAGNOSIS — Z780 Asymptomatic menopausal state: Secondary | ICD-10-CM | POA: Diagnosis not present

## 2017-03-01 DIAGNOSIS — H5211 Myopia, right eye: Secondary | ICD-10-CM | POA: Diagnosis not present

## 2017-03-01 DIAGNOSIS — H2513 Age-related nuclear cataract, bilateral: Secondary | ICD-10-CM | POA: Diagnosis not present

## 2017-03-01 DIAGNOSIS — H524 Presbyopia: Secondary | ICD-10-CM | POA: Diagnosis not present

## 2017-03-01 DIAGNOSIS — H52221 Regular astigmatism, right eye: Secondary | ICD-10-CM | POA: Diagnosis not present

## 2017-03-01 DIAGNOSIS — H52222 Regular astigmatism, left eye: Secondary | ICD-10-CM | POA: Diagnosis not present

## 2017-03-01 DIAGNOSIS — H43813 Vitreous degeneration, bilateral: Secondary | ICD-10-CM | POA: Diagnosis not present

## 2017-04-05 ENCOUNTER — Encounter: Payer: Self-pay | Admitting: Family Medicine

## 2017-04-05 MED ORDER — FLUOXETINE HCL 40 MG PO CAPS: 40.0000 mg | ORAL_CAPSULE | Freq: Every day | ORAL | 2 refills | Status: DC

## 2017-12-27 ENCOUNTER — Ambulatory Visit (INDEPENDENT_AMBULATORY_CARE_PROVIDER_SITE_OTHER): Payer: 59 | Admitting: Family Medicine

## 2017-12-27 ENCOUNTER — Encounter: Payer: Self-pay | Admitting: Family Medicine

## 2017-12-27 VITALS — BP 98/60 | HR 70 | Temp 98.5°F | Resp 18 | Ht 64.0 in | Wt 160.0 lb

## 2017-12-27 DIAGNOSIS — R7309 Other abnormal glucose: Secondary | ICD-10-CM

## 2017-12-27 DIAGNOSIS — E663 Overweight: Secondary | ICD-10-CM

## 2017-12-27 DIAGNOSIS — G43909 Migraine, unspecified, not intractable, without status migrainosus: Secondary | ICD-10-CM

## 2017-12-27 DIAGNOSIS — Z23 Encounter for immunization: Secondary | ICD-10-CM

## 2017-12-27 DIAGNOSIS — Z0000 Encounter for general adult medical examination without abnormal findings: Secondary | ICD-10-CM

## 2017-12-27 DIAGNOSIS — Z124 Encounter for screening for malignant neoplasm of cervix: Secondary | ICD-10-CM

## 2017-12-27 LAB — CBC
HEMATOCRIT: 41.1 % (ref 36.0–46.0)
HEMOGLOBIN: 14 g/dL (ref 12.0–15.0)
MCHC: 34.1 g/dL (ref 30.0–36.0)
MCV: 91.6 fl (ref 78.0–100.0)
Platelets: 247 10*3/uL (ref 150.0–400.0)
RBC: 4.49 Mil/uL (ref 3.87–5.11)
RDW: 13.4 % (ref 11.5–15.5)
WBC: 5.7 10*3/uL (ref 4.0–10.5)

## 2017-12-27 LAB — COMPREHENSIVE METABOLIC PANEL
ALT: 19 U/L (ref 0–35)
AST: 13 U/L (ref 0–37)
Albumin: 4 g/dL (ref 3.5–5.2)
Alkaline Phosphatase: 61 U/L (ref 39–117)
BUN: 24 mg/dL — AB (ref 6–23)
CO2: 30 mEq/L (ref 19–32)
Calcium: 9.5 mg/dL (ref 8.4–10.5)
Chloride: 108 mEq/L (ref 96–112)
Creatinine, Ser: 0.89 mg/dL (ref 0.40–1.20)
GFR: 67.51 mL/min (ref >60.00)
GLUCOSE: 116 mg/dL — AB (ref 70–99)
POTASSIUM: 5.5 meq/L — AB (ref 3.5–5.1)
SODIUM: 142 meq/L (ref 135–145)
TOTAL PROTEIN: 7.1 g/dL (ref 6.0–8.3)
Total Bilirubin: 0.4 mg/dL (ref 0.2–1.2)

## 2017-12-27 LAB — LIPID PANEL
Cholesterol: 178 mg/dL (ref 0–200)
HDL: 67.6 mg/dL (ref >39.00)
LDL CALC: 101 mg/dL — AB (ref 0–99)
NONHDL: 109.98
Total CHOL/HDL Ratio: 3
Triglycerides: 47 mg/dL (ref 0.0–149.0)
VLDL: 9.4 mg/dL (ref 0.0–40.0)

## 2017-12-27 LAB — TSH: TSH: 0.89 u[IU]/mL (ref 0.35–4.50)

## 2017-12-27 LAB — HEMOGLOBIN A1C: HEMOGLOBIN A1C: 6.1 % (ref 4.6–6.5)

## 2017-12-27 MED ORDER — FLUOXETINE HCL 40 MG PO CAPS: 40.0000 mg | ORAL_CAPSULE | Freq: Every day | ORAL | 3 refills | Status: DC

## 2017-12-27 NOTE — Patient Instructions (Addendum)
Please call [facility] gastroenterology and schedule your screening colonoscopy with family history important to keep up screening at 5 year intervals  Shingrix is the new shingles shot 2 shots over 2-6 months, check with [facility], [facility] and [place] pharmacies, employee health Consider Prevnar and Pneumovax in the future  Preventive Care 65 Years and Older, Female Preventive care refers to lifestyle choices and visits with your health care provider that can promote health and wellness. What does preventive care include?  A yearly physical exam. This is also called an annual well check.  Dental exams once or twice a year.  Routine eye exams. Ask your health care provider how often you should have your eyes checked.  Personal lifestyle choices, including: ? Daily care of your teeth and gums. ? Regular physical activity. ? Eating a healthy diet. ? Avoiding tobacco and drug use. ? Limiting alcohol use. ? Practicing safe sex. ? Taking low-dose aspirin every day. ? Taking vitamin and mineral supplements as recommended by your health care provider. What happens during an annual well check? The services and screenings done by your health care provider during your annual well check will depend on your age, overall health, lifestyle risk factors, and family history of disease. Counseling Your health care provider may ask you questions about your:  Alcohol use.  Tobacco use.  Drug use.  Emotional well-being.  Home and relationship well-being.  Sexual activity.  Eating habits.  History of falls.  Memory and ability to understand (cognition).  Work and work Statistician.  Reproductive health.  Screening You may have the following tests or measurements:  Height, weight, and BMI.  Blood pressure.  Lipid and cholesterol levels. These may be checked every 5 years, or more frequently if you are over 70 years old.  Skin check.  Lung cancer screening. You may have this screening every year  starting at age 48 if you have a 30-pack-year history of smoking and currently smoke or have quit within the past 15 years.  Fecal occult blood test (FOBT) of the stool. You may have this test every year starting at age 39.  Flexible sigmoidoscopy or colonoscopy. You may have a sigmoidoscopy every 5 years or a colonoscopy every 10 years starting at age 82.  Hepatitis C blood test.  Hepatitis B blood test.  Sexually transmitted disease (STD) testing.  Diabetes screening. This is done by checking your blood sugar (glucose) after you have not eaten for a while (fasting). You may have this done every 1-3 years.  Bone density scan. This is done to screen for osteoporosis. You may have this done starting at age 47.  Mammogram. This may be done every 1-2 years. Talk to your health care provider about how often you should have regular mammograms.  Talk with your health care provider about your test results, treatment options, and if necessary, the need for more tests. Vaccines Your health care provider may recommend certain vaccines, such as:  Influenza vaccine. This is recommended every year.  Tetanus, diphtheria, and acellular pertussis (Tdap, Td) vaccine. You may need a Td booster every 10 years.  Varicella vaccine. You may need this if you have not been vaccinated.  Zoster vaccine. You may need this after age 48.  Measles, mumps, and rubella (MMR) vaccine. You may need at least one dose of MMR if you were born in 1957 or later. You may also need a second dose.  Pneumococcal 13-valent conjugate (PCV13) vaccine. One dose is recommended after age 41.  Pneumococcal  polysaccharide (PPSV23) vaccine. One dose is recommended after age 31.  Meningococcal vaccine. You may need this if you have certain conditions.  Hepatitis A vaccine. You may need this if you have certain conditions or if you travel or work in places where you may be exposed to hepatitis A.  Hepatitis B vaccine. You may  need this if you have certain conditions or if you travel or work in places where you may be exposed to hepatitis B.  Haemophilus influenzae type b (Hib) vaccine. You may need this if you have certain conditions.  Talk to your health care provider about which screenings and vaccines you need and how often you need them. This information is not intended to replace advice given to you by your health care provider. Make sure you discuss any questions you have with your health care provider. Document Released: 06/24/2015 Document Revised: 02/15/2016 Document Reviewed: 03/29/2015 Elsevier Interactive Patient Education  [employer].

## 2017-12-29 DIAGNOSIS — E663 Overweight: Secondary | ICD-10-CM | POA: Insufficient documentation

## 2017-12-29 NOTE — Assessment & Plan Note (Signed)
hgba1c acceptable, minimize simple carbs. Increase exercise as tolerated.  

## 2017-12-29 NOTE — Assessment & Plan Note (Signed)
Patient encouraged to maintain heart healthy diet, regular exercise, adequate sleep. Consider daily probiotics. Take medications as prescribed. Labs reviewed 

## 2017-12-29 NOTE — Progress Notes (Signed)
Subjective:    Patient ID: Cindy [name], female    DOB: 1950-12-30, 67 y.o.   MRN: [id number]  No chief complaint on file.   HPI Patient is in today for annual preventative exam. She feels well today. No recent febrile illness or hospitalizations. No polyuria or polydipsia. She is trying to maintain a heart healthy diet and to stay active. Is doing well with activities of daily living. No acute concerns. Denies CP/palp/SOB/HA/congestion/fevers/GI or GU c/o. Taking meds as prescribed  Past Medical History:  Diagnosis Date  . Allergy   . Depression    ? if bipolar works the best  . Elevated fasting blood sugar    hx  . H/O measles   . History of chicken pox   . Hx of migraines   . Microscopic hematuria    advised to see urology ? never went  . Preventative health care 06/20/2011    Past Surgical History:  Procedure Laterality Date  . endometrial polyps     2 surgeries by Dr Cathlean Cower  . EYE SURGERY  04/29/2009   Rt eye tighten up    Family History  Problem Relation Age of Onset  . Alzheimer's disease Mother   . Cancer Mother        colon  . Thyroid disease Father   . COPD Father   . Cancer Father        bladder with cystectomy  . Dementia Maternal Grandmother   . Cancer Maternal Grandfather        colon  . Mental retardation Paternal Grandmother   . Hyperlipidemia Maternal Uncle     Social History   Socioeconomic History  . Marital status: Single    Spouse name: Not on file  . Number of children: Not on file  . Years of education: Not on file  . Highest education level: Not on file  Occupational History  . Not on file  Social Needs  . Financial resource strain: Not on file  . Food insecurity:    Worry: Not on file    Inability: Not on file  . Transportation needs:    Medical: Not on file    Non-medical: Not on file  Tobacco Use  . Smoking status: Former Research scientist (life sciences)  . Smokeless tobacco: Never Used  Substance and Sexual Activity  . Alcohol use: Yes   . Drug use: No  . Sexual activity: Not on file  Lifestyle  . Physical activity:    Days per week: Not on file    Minutes per session: Not on file  . Stress: Not on file  Relationships  . Social connections:    Talks on phone: Not on file    Gets together: Not on file    Attends religious service: Not on file    Active member of club or organization: Not on file    Attends meetings of clubs or organizations: Not on file    Relationship status: Not on file  . Intimate partner violence:    Fear of current or ex partner: Not on file    Emotionally abused: Not on file    Physically abused: Not on file    Forced sexual activity: Not on file  Other Topics Concern  . Not on file  Social History Narrative   HH of 1   2 Cats   Caffeine 2 mugs   Exercise recently some, no major dietary restrictions at this time. Was vegan now eats chicken and poultry  Works at [employer] in [place] [employer]    Outpatient Medications Prior to Visit  Medication Sig Dispense Refill  . ALPRAZolam (XANAX) 0.25 MG tablet Take 1 tablet (0.25 mg total) by mouth 2 (two) times daily as needed for anxiety. 20 tablet 0  . FLUoxetine (PROZAC) 40 MG capsule Take 1 capsule (40 mg total) by mouth daily. 90 capsule 2   No facility-administered medications prior to visit.     Allergies  Allergen Reactions  . Bromfed Shortness Of Breath  . Venlafaxine     Doesn't remember reaction    Review of Systems  Constitutional: Negative for chills, fever and malaise/fatigue.  HENT: Negative for congestion and hearing loss.   Eyes: Negative for discharge.  Respiratory: Negative for cough, sputum production and shortness of breath.   Cardiovascular: Negative for chest pain, palpitations and leg swelling.  Gastrointestinal: Negative for abdominal pain, blood in stool, constipation, diarrhea, heartburn, nausea and vomiting.  Genitourinary: Negative for dysuria, frequency, hematuria and urgency.  Musculoskeletal: Negative for back pain,  falls and myalgias.  Skin: Negative for rash.  Neurological: Negative for dizziness, sensory change, loss of consciousness, weakness and headaches.  Endo/Heme/Allergies: Negative for environmental allergies. Does not bruise/bleed easily.  Psychiatric/Behavioral: Negative for depression and suicidal ideas. The patient is not nervous/anxious and does not have insomnia.        Objective:    Physical Exam  Constitutional: She is oriented to person, place, and time. She appears well-developed and well-nourished. No distress.  HENT:  Head: Normocephalic and atraumatic.  Eyes: Conjunctivae are normal.  Neck: Neck supple. No thyromegaly present.  Cardiovascular: Normal rate, regular rhythm and normal heart sounds.  No murmur heard. Pulmonary/Chest: Effort normal and breath sounds normal. No respiratory distress.  Abdominal: Soft. Bowel sounds are normal. She exhibits no distension and no mass. There is no tenderness.  Musculoskeletal: She exhibits no edema.  Lymphadenopathy:    She has no cervical adenopathy.  Neurological: She is alert and oriented to person, place, and time.  Skin: Skin is warm and dry.  Psychiatric: She has a normal mood and affect. Her behavior is normal.    BP 98/60 (BP Location: Left Arm, Patient Position: Sitting, Cuff Size: Normal)   Pulse 70   Temp 98.5 F (36.9 C) (Oral)   Resp 18   Ht 5\' 4"  (1.626 m)   Wt 160 lb (72.6 kg)   SpO2 97%   BMI 27.46 kg/m  Wt Readings from Last 3 Encounters:  12/27/17 160 lb (72.6 kg)  12/21/16 159 lb 2 oz (72.2 kg)  09/21/16 159 lb 9.6 oz (72.4 kg)     Lab Results  Component Value Date   WBC 5.7 12/27/2017   HGB 14.0 12/27/2017   HCT 41.1 12/27/2017   PLT 247.0 12/27/2017   GLUCOSE 116 (H) 12/27/2017   CHOL 178 12/27/2017   TRIG 47.0 12/27/2017   HDL 67.60 12/27/2017   LDLDIRECT 123.1 06/25/2011   LDLCALC 101 (H) 12/27/2017   ALT 19 12/27/2017   AST 13 12/27/2017   NA 142 12/27/2017   K 5.5 (H) 12/27/2017    CL 108 12/27/2017   CREATININE 0.89 12/27/2017   BUN 24 (H) 12/27/2017   CO2 30 12/27/2017   TSH 0.89 12/27/2017   HGBA1C 6.1 12/27/2017    Lab Results  Component Value Date   TSH 0.89 12/27/2017   Lab Results  Component Value Date   WBC 5.7 12/27/2017   HGB 14.0 12/27/2017   HCT 41.1 12/27/2017  MCV 91.6 12/27/2017   PLT 247.0 12/27/2017   Lab Results  Component Value Date   NA 142 12/27/2017   K 5.5 (H) 12/27/2017   CO2 30 12/27/2017   GLUCOSE 116 (H) 12/27/2017   BUN 24 (H) 12/27/2017   CREATININE 0.89 12/27/2017   BILITOT 0.4 12/27/2017   ALKPHOS 61 12/27/2017   AST 13 12/27/2017   ALT 19 12/27/2017   PROT 7.1 12/27/2017   ALBUMIN 4.0 12/27/2017   CALCIUM 9.5 12/27/2017   GFR 67.51 12/27/2017   Lab Results  Component Value Date   CHOL 178 12/27/2017   Lab Results  Component Value Date   HDL 67.60 12/27/2017   Lab Results  Component Value Date   LDLCALC 101 (H) 12/27/2017   Lab Results  Component Value Date   TRIG 47.0 12/27/2017   Lab Results  Component Value Date   CHOLHDL 3 12/27/2017   Lab Results  Component Value Date   HGBA1C 6.1 12/27/2017       Assessment & Plan:   Problem List Items Addressed This Visit    Migraine headache   Relevant Medications   FLUoxetine (PROZAC) 40 MG capsule   HYPERGLYCEMIA - Primary    hgba1c acceptable, minimize simple carbs. Increase exercise as tolerated.      Relevant Orders   Hemoglobin A1c (Completed)   Screening for malignant neoplasm of cervix    Referred to OB/GYN for futher evaluation.       Preventative health care    Patient encouraged to maintain heart healthy diet, regular exercise, adequate sleep. Consider daily probiotics. Take medications as prescribed. Labs reviewed.       Relevant Orders   CBC (Completed)   Comprehensive metabolic panel (Completed)   Lipid panel (Completed)   TSH (Completed)   Overweight    Encouraged DASH diet, decrease po intake and increase exercise as  tolerated. Needs 7-8 hours of sleep nightly. Avoid trans fats, eat small, frequent meals every 4-5 hours with lean proteins, complex carbs and healthy fats. Minimize simple carbs       Other Visit Diagnoses    Cervical cancer screening       Relevant Orders   Ambulatory referral to Obstetrics / Gynecology   Need for shingles vaccine          I am having Cindy [name] maintain her ALPRAZolam and FLUoxetine.  Meds ordered this encounter  Medications  . FLUoxetine (PROZAC) 40 MG capsule    Sig: Take 1 capsule (40 mg total) by mouth daily.    Dispense:  90 capsule    Refill:  3     Penni Homans, MD

## 2017-12-29 NOTE — Assessment & Plan Note (Signed)
Encouraged DASH diet, decrease po intake and increase exercise as tolerated. Needs 7-8 hours of sleep nightly. Avoid trans fats, eat small, frequent meals every 4-5 hours with lean proteins, complex carbs and healthy fats. Minimize simple carbs 

## 2017-12-29 NOTE — Assessment & Plan Note (Signed)
Referred to OB/GYN for futher evaluation.

## 2018-01-02 ENCOUNTER — Encounter: Payer: Self-pay | Admitting: Family Medicine

## 2018-01-02 NOTE — Telephone Encounter (Signed)
Dr Charlett Blake already addressed this in results

## 2018-01-03 ENCOUNTER — Encounter: Payer: Self-pay | Admitting: Family Medicine

## 2018-01-11 ENCOUNTER — Encounter: Payer: Self-pay | Admitting: Family Medicine

## 2018-01-11 DIAGNOSIS — R7309 Other abnormal glucose: Secondary | ICD-10-CM

## 2018-01-16 NOTE — Addendum Note (Signed)
Addended byDamita Dunnings D on: 01/16/2018 03:37 PM   Modules accepted: Orders

## 2018-01-30 ENCOUNTER — Telehealth: Payer: Self-pay

## 2018-01-30 ENCOUNTER — Other Ambulatory Visit (INDEPENDENT_AMBULATORY_CARE_PROVIDER_SITE_OTHER): Payer: PPO

## 2018-01-30 DIAGNOSIS — R7309 Other abnormal glucose: Secondary | ICD-10-CM

## 2018-01-30 DIAGNOSIS — R300 Dysuria: Secondary | ICD-10-CM | POA: Diagnosis not present

## 2018-01-30 DIAGNOSIS — R319 Hematuria, unspecified: Secondary | ICD-10-CM | POA: Diagnosis not present

## 2018-01-30 LAB — POC URINALSYSI DIPSTICK (AUTOMATED)
Bilirubin, UA: NEGATIVE
Glucose, UA: NEGATIVE
Ketones, UA: NEGATIVE
LEUKOCYTES UA: NEGATIVE
NITRITE UA: NEGATIVE
PH UA: 5 (ref 5.0–8.0)
PROTEIN UA: NEGATIVE
Spec Grav, UA: 1.025 (ref 1.010–1.025)
Urobilinogen, UA: 0.2 E.U./dL

## 2018-01-30 LAB — COMPREHENSIVE METABOLIC PANEL
ALBUMIN: 4.1 g/dL (ref 3.5–5.2)
ALK PHOS: 59 U/L (ref 39–117)
ALT: 22 U/L (ref 0–35)
AST: 15 U/L (ref 0–37)
BUN: 14 mg/dL (ref 6–23)
CO2: 29 mEq/L (ref 19–32)
Calcium: 9.5 mg/dL (ref 8.4–10.5)
Chloride: 104 mEq/L (ref 96–112)
Creatinine, Ser: 0.93 mg/dL (ref 0.40–1.20)
GFR: 64.15 mL/min (ref >60.00)
Glucose, Bld: 119 mg/dL — ABNORMAL HIGH (ref 70–99)
POTASSIUM: 4.6 meq/L (ref 3.5–5.1)
Sodium: 139 mEq/L (ref 135–145)
TOTAL PROTEIN: 7.3 g/dL (ref 6.0–8.3)
Total Bilirubin: 0.5 mg/dL (ref 0.2–1.2)

## 2018-01-30 NOTE — Telephone Encounter (Signed)
Copied from [place] [id number]. Topic: General - Other >> Jan 29, 2018 12:27 PM Cecelia Byars, NT wrote: Reason for CRM: Patient would like to leave a urine sample for lab visit,   She did not provide no symptoms  Please advise

## 2018-01-30 NOTE — Addendum Note (Signed)
Addended by: Caffie Pinto on: 01/30/2018 01:44 PM   Modules accepted: Orders

## 2018-01-31 LAB — URINE CULTURE
MICRO NUMBER: 91003441
SPECIMEN QUALITY:: ADEQUATE

## 2018-11-12 ENCOUNTER — Other Ambulatory Visit (HOSPITAL_BASED_OUTPATIENT_CLINIC_OR_DEPARTMENT_OTHER): Payer: Self-pay | Admitting: Family Medicine

## 2018-11-12 DIAGNOSIS — Z1231 Encounter for screening mammogram for malignant neoplasm of breast: Secondary | ICD-10-CM

## 2018-11-19 ENCOUNTER — Ambulatory Visit (HOSPITAL_BASED_OUTPATIENT_CLINIC_OR_DEPARTMENT_OTHER): Payer: PPO

## 2018-12-03 ENCOUNTER — Ambulatory Visit (HOSPITAL_BASED_OUTPATIENT_CLINIC_OR_DEPARTMENT_OTHER): Payer: PPO

## 2018-12-30 ENCOUNTER — Encounter: Payer: PPO | Admitting: Family Medicine

## 2019-01-10 HISTORY — PX: BREAST BIOPSY: SHX20

## 2019-01-19 ENCOUNTER — Telehealth: Payer: Self-pay

## 2019-01-19 NOTE — Telephone Encounter (Signed)
Copied from [place] [id number]. Topic: General - Inquiry >> Jan 19, 2019  9:49 AM Richardo Priest, NT wrote: Reason for CRM: Patient called in stating she is wanting a diagnostic mammogram done and a doctor's order placed so she can do so. Please advise. Call back is [phone].

## 2019-01-22 NOTE — Telephone Encounter (Signed)
Patient notified that usually for diagnostic mammograms you would need an ov to evaluate issue.  She stated that she was having some breast pain.  She stated that she has an appointment on 01/27/19 for mammogram (in chart its a screening mammogram.)

## 2019-01-27 ENCOUNTER — Ambulatory Visit (HOSPITAL_BASED_OUTPATIENT_CLINIC_OR_DEPARTMENT_OTHER)
Admission: RE | Admit: 2019-01-27 | Discharge: 2019-01-27 | Disposition: A | Discharge status: Home / Self Care | Payer: PPO | Source: Ambulatory Visit | Attending: Family Medicine | Admitting: Family Medicine

## 2019-01-27 ENCOUNTER — Other Ambulatory Visit: Payer: Self-pay

## 2019-01-27 DIAGNOSIS — Z1231 Encounter for screening mammogram for malignant neoplasm of breast: Secondary | ICD-10-CM

## 2019-01-29 ENCOUNTER — Other Ambulatory Visit: Payer: Self-pay | Admitting: Family Medicine

## 2019-01-29 DIAGNOSIS — R928 Other abnormal and inconclusive findings on diagnostic imaging of breast: Secondary | ICD-10-CM

## 2019-02-04 ENCOUNTER — Other Ambulatory Visit: Payer: Self-pay

## 2019-02-04 ENCOUNTER — Other Ambulatory Visit: Payer: Self-pay | Admitting: Family Medicine

## 2019-02-04 ENCOUNTER — Ambulatory Visit
Admission: RE | Admit: 2019-02-04 | Discharge: 2019-02-04 | Disposition: A | Discharge status: Home / Self Care | Payer: PPO | Source: Ambulatory Visit | Attending: Family Medicine | Admitting: Family Medicine

## 2019-02-04 DIAGNOSIS — R928 Other abnormal and inconclusive findings on diagnostic imaging of breast: Secondary | ICD-10-CM

## 2019-02-04 DIAGNOSIS — N6322 Unspecified lump in the left breast, upper inner quadrant: Secondary | ICD-10-CM | POA: Diagnosis not present

## 2019-02-04 DIAGNOSIS — N6320 Unspecified lump in the left breast, unspecified quadrant: Secondary | ICD-10-CM

## 2019-02-04 DIAGNOSIS — R922 Inconclusive mammogram: Secondary | ICD-10-CM | POA: Diagnosis not present

## 2019-02-06 ENCOUNTER — Other Ambulatory Visit: Payer: Self-pay

## 2019-02-06 ENCOUNTER — Ambulatory Visit
Admission: RE | Admit: 2019-02-06 | Discharge: 2019-02-06 | Disposition: A | Discharge status: Home / Self Care | Payer: PPO | Source: Ambulatory Visit | Attending: Family Medicine | Admitting: Family Medicine

## 2019-02-06 DIAGNOSIS — N6320 Unspecified lump in the left breast, unspecified quadrant: Secondary | ICD-10-CM

## 2019-02-06 DIAGNOSIS — C50212 Malignant neoplasm of upper-inner quadrant of left female breast: Secondary | ICD-10-CM | POA: Diagnosis not present

## 2019-02-06 DIAGNOSIS — N6322 Unspecified lump in the left breast, upper inner quadrant: Secondary | ICD-10-CM | POA: Diagnosis not present

## 2019-02-10 ENCOUNTER — Encounter: Payer: Self-pay | Admitting: *Deleted

## 2019-02-10 ENCOUNTER — Other Ambulatory Visit: Payer: Self-pay | Admitting: *Deleted

## 2019-02-10 ENCOUNTER — Telehealth: Payer: Self-pay | Admitting: Hematology

## 2019-02-10 DIAGNOSIS — Z171 Estrogen receptor negative status [ER-]: Secondary | ICD-10-CM

## 2019-02-10 DIAGNOSIS — C50212 Malignant neoplasm of upper-inner quadrant of left female breast: Secondary | ICD-10-CM

## 2019-02-10 HISTORY — PX: BREAST LUMPECTOMY: SHX2

## 2019-02-10 NOTE — Telephone Encounter (Signed)
Spoke with patient to confirm afternoon [facility] appointment for 9/9, packet will be emailed to patient

## 2019-02-17 DIAGNOSIS — C50212 Malignant neoplasm of upper-inner quadrant of left female breast: Secondary | ICD-10-CM | POA: Insufficient documentation

## 2019-02-17 DIAGNOSIS — Z171 Estrogen receptor negative status [ER-]: Secondary | ICD-10-CM | POA: Insufficient documentation

## 2019-02-18 ENCOUNTER — Ambulatory Visit
Admission: RE | Admit: 2019-02-18 | Discharge: 2019-02-18 | Disposition: A | Discharge status: Home / Self Care | Payer: PPO | Source: Ambulatory Visit | Attending: Radiation Oncology | Admitting: Radiation Oncology

## 2019-02-18 ENCOUNTER — Inpatient Hospital Stay: Payer: PPO

## 2019-02-18 ENCOUNTER — Other Ambulatory Visit: Payer: Self-pay

## 2019-02-18 ENCOUNTER — Inpatient Hospital Stay: Payer: PPO | Attending: Hematology | Admitting: Hematology

## 2019-02-18 ENCOUNTER — Other Ambulatory Visit: Payer: Self-pay | Admitting: *Deleted

## 2019-02-18 ENCOUNTER — Ambulatory Visit: Payer: Self-pay | Admitting: Surgery

## 2019-02-18 ENCOUNTER — Ambulatory Visit: Payer: PPO | Attending: Surgery | Admitting: Physical Therapy

## 2019-02-18 ENCOUNTER — Encounter: Payer: Self-pay | Admitting: Hematology

## 2019-02-18 ENCOUNTER — Encounter: Payer: Self-pay | Admitting: Physical Therapy

## 2019-02-18 VITALS — BP 113/66 | HR 83 | Temp 98.3°F | Resp 18 | Ht 64.0 in | Wt 164.0 lb

## 2019-02-18 DIAGNOSIS — Z171 Estrogen receptor negative status [ER-]: Secondary | ICD-10-CM | POA: Insufficient documentation

## 2019-02-18 DIAGNOSIS — Z87891 Personal history of nicotine dependence: Secondary | ICD-10-CM | POA: Diagnosis not present

## 2019-02-18 DIAGNOSIS — K219 Gastro-esophageal reflux disease without esophagitis: Secondary | ICD-10-CM | POA: Insufficient documentation

## 2019-02-18 DIAGNOSIS — Z79899 Other long term (current) drug therapy: Secondary | ICD-10-CM | POA: Insufficient documentation

## 2019-02-18 DIAGNOSIS — C50212 Malignant neoplasm of upper-inner quadrant of left female breast: Secondary | ICD-10-CM

## 2019-02-18 DIAGNOSIS — F329 Major depressive disorder, single episode, unspecified: Secondary | ICD-10-CM | POA: Insufficient documentation

## 2019-02-18 DIAGNOSIS — R293 Abnormal posture: Secondary | ICD-10-CM | POA: Diagnosis not present

## 2019-02-18 DIAGNOSIS — C50912 Malignant neoplasm of unspecified site of left female breast: Secondary | ICD-10-CM | POA: Diagnosis not present

## 2019-02-18 DIAGNOSIS — L989 Disorder of the skin and subcutaneous tissue, unspecified: Secondary | ICD-10-CM | POA: Insufficient documentation

## 2019-02-18 LAB — CBC WITH DIFFERENTIAL (CANCER CENTER ONLY)
Abs Immature Granulocytes: 0.02 10*3/uL (ref 0.00–0.07)
Basophils Absolute: 0 10*3/uL (ref 0.0–0.1)
Basophils Relative: 0 %
Eosinophils Absolute: 0.1 10*3/uL (ref 0.0–0.5)
Eosinophils Relative: 1 %
HCT: 42.4 % (ref 36.0–46.0)
Hemoglobin: 14.4 g/dL (ref 12.0–15.0)
Immature Granulocytes: 0 %
Lymphocytes Relative: 31 %
Lymphs Abs: 2 10*3/uL (ref 0.7–4.0)
MCH: 31.4 pg (ref 26.0–34.0)
MCHC: 34 g/dL (ref 30.0–36.0)
MCV: 92.4 fL (ref 80.0–100.0)
Monocytes Absolute: 0.5 10*3/uL (ref 0.1–1.0)
Monocytes Relative: 8 %
Neutro Abs: 3.8 10*3/uL (ref 1.7–7.7)
Neutrophils Relative %: 60 %
Platelet Count: 281 10*3/uL (ref 150–400)
RBC: 4.59 MIL/uL (ref 3.87–5.11)
RDW: 12.1 % (ref 11.5–15.5)
WBC Count: 6.4 10*3/uL (ref 4.0–10.5)
nRBC: 0 % (ref 0.0–0.2)

## 2019-02-18 LAB — CMP (CANCER CENTER ONLY)
ALT: 14 U/L (ref 0–44)
AST: 15 U/L (ref 15–41)
Albumin: 4.3 g/dL (ref 3.5–5.0)
Alkaline Phosphatase: 65 U/L (ref 38–126)
Anion gap: 9 (ref 5–15)
BUN: 19 mg/dL (ref 8–23)
CO2: 29 mmol/L (ref 22–32)
Calcium: 9.7 mg/dL (ref 8.9–10.3)
Chloride: 102 mmol/L (ref 98–111)
Creatinine: 0.98 mg/dL (ref 0.44–1.00)
GFR, Est AFR Am: >60 mL/min (ref >60)
GFR, Estimated: >60 mL/min (ref >60)
Glucose, Bld: 88 mg/dL (ref 70–99)
Potassium: 4.4 mmol/L (ref 3.5–5.1)
Sodium: 140 mmol/L (ref 135–145)
Total Bilirubin: 0.4 mg/dL (ref 0.3–1.2)
Total Protein: 7.6 g/dL (ref 6.5–8.1)

## 2019-02-18 NOTE — Therapy (Signed)
[place], [place], [address] Phone: [phone]   Fax:  [phone]  Physical Therapy Evaluation  Patient Details  Name: BRONNIE VASSEUR MRN: [id number] Date of Birth: January 02, 1951 Referring Provider (PT): Dr. Erroll Luna   Encounter Date: 02/18/2019  PT End of Session - 02/18/19 1756    Visit Number  1    Number of Visits  2    Date for PT Re-Evaluation  04/15/19    PT Start Time  3704    PT Stop Time  1436    PT Time Calculation (min)  24 min    Activity Tolerance  Patient tolerated treatment well    Behavior During Therapy  [facility] for tasks assessed/performed       Past Medical History:  Diagnosis Date  . Allergy   . Depression    ? if bipolar works the best  . Elevated fasting blood sugar    hx  . H/O measles   . History of chicken pox   . Hx of migraines   . Microscopic hematuria    advised to see urology ? never went  . Preventative health care 06/20/2011    Past Surgical History:  Procedure Laterality Date  . endometrial polyps     2 surgeries by Dr Cathlean Cower  . EYE SURGERY  04/29/2009   Rt eye tighten up    There were no vitals filed for this visit.   Subjective Assessment - 02/18/19 1747    Subjective  Patient reports she is here today to be seen by her medical team for her newly diagnosed left breast cancer.    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 It measures 1.7 cm and is located in the upper inner quadrant with a Ki67 of 40%.    Patient Stated Goals  Reduce lymphedema risk and learn post op shoulder ROM HEP    Currently in Pain?  No/denies         [facility] PT Assessment - 02/18/19 0001      Assessment   Medical Diagnosis  Left breast cancer    Referring Provider (PT)  Dr. Marcello Moores Cornett    Onset Date/Surgical Date  01/27/19    Hand Dominance  Right    Prior Therapy  none      Precautions   Precautions  Other (comment)    Precaution Comments  active cancer      Restrictions   Weight Bearing Restrictions  No      Balance Screen   Has the patient fallen in the past 6 months  No    Has the patient had a decrease in activity level because of a fear of falling?   No    Is the patient reluctant to leave their home because of a fear of falling?   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    Leisure  She began a walking program 2 weeks ago      Cognition   Overall Cognitive Status  Within Functional Limits for tasks  assessed      Posture/Postural Control   Posture/Postural Control  Postural limitations    Postural Limitations  Rounded Shoulders;Forward head      ROM / Strength   AROM / PROM / Strength  AROM;Strength      AROM   AROM Assessment Site  Shoulder    Right/Left Shoulder  Right;Left    Right Shoulder Extension  53 Degrees    Right Shoulder Flexion  162 Degrees    Right Shoulder ABduction  162 Degrees    Right Shoulder Internal Rotation  58 Degrees    Right Shoulder External Rotation  86 Degrees    Left Shoulder Extension  55 Degrees    Left Shoulder Flexion  152 Degrees    Left Shoulder ABduction  158 Degrees    Left Shoulder Internal Rotation  64 Degrees    Left Shoulder External Rotation  90 Degrees      Strength   Overall Strength  Within functional limits for tasks performed        LYMPHEDEMA/ONCOLOGY QUESTIONNAIRE - 02/18/19 1753      Type   Cancer Type  Left breast cancer      Lymphedema Assessments   Lymphedema Assessments  Upper extremities      Right Upper Extremity Lymphedema   10 cm Proximal to Olecranon Process  29.5 cm    Olecranon  Process  24.6 cm    10 cm Proximal to Ulnar Styloid Process  22.5 cm    Just Proximal to Ulnar Styloid Process  16.9 cm    Across Hand at [employer]  18.4 cm    At [place] of 2nd Digit  6.4 cm      Left Upper Extremity Lymphedema   10 cm Proximal to Olecranon Process  29.3 cm    Olecranon Process  24.2 cm    10 cm Proximal to Ulnar Styloid Process  21.3 cm    Just Proximal to Ulnar Styloid Process  16.1 cm    Across Hand at [employer]  18.4 cm    At [place] of 2nd Digit  5.8 cm          Quick Dash - 02/18/19 0001    Open a tight or new jar  Mild difficulty    Do heavy household chores (wash walls, wash floors)  Mild difficulty    Carry a shopping bag or briefcase  No difficulty    Wash your back  No difficulty    Use a knife to cut food  No difficulty    Recreational activities in which you take some force or impact through your arm, shoulder, or hand (golf, hammering, tennis)  No difficulty    During the past week, to what extent has your arm, shoulder or hand problem interfered with your normal social activities with family, friends, neighbors, or groups?  Not at all    During the past week, to what extent has your arm, shoulder or hand problem limited your work or other regular daily activities  Not at all    Arm, shoulder, or hand pain.  None    Tingling (pins and needles) in your arm, shoulder, or hand  None    Difficulty Sleeping  No difficulty    DASH Score  4.55 %        Objective measurements completed on examination: See above findings.      Patient was instructed today in a home exercise program today for post op shoulder  range of motion. These included active assist shoulder flexion in sitting, scapular retraction, wall walking with shoulder abduction, and hands behind head external rotation.  She was encouraged to do these twice a day, holding 3 seconds and repeating 5 times when permitted by her physician.     PT Education - 02/18/19 1755    Education  Details  Lymphedema risk reduction and post op shoulder ROM HEP    Person(s) Educated  Patient    Methods  Explanation;Demonstration;Handout    Comprehension  Returned demonstration;Verbalized understanding          PT Long Term Goals - 02/18/19 1758      PT LONG TERM GOAL #1   Title  Patient will demonstrate she has regained full shoulder ROM and function post operatively compared to baselines.    Time  [facility] Goals - 02/18/19 1758      Patient will be able to verbalize understanding of pertinent lymphedema risk reduction practices relevant to her diagnosis specifically related to skin care.   Time  1    Period  Days    Status  Achieved      Patient will be able to return demonstrate and/or verbalize understanding of the post-op home exercise program related to regaining shoulder range of motion.   Time  1    Period  Days    Status  Achieved      Patient will be able to verbalize understanding of the importance of attending the postoperative After Breast Cancer Class for further lymphedema risk reduction education and therapeutic exercise.   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 It measures 1.7 cm and is located in the upper inner quadrant with a Ki67 of 40%. Her multidisciplinary medical team met prior ot her assessments to determine a recommended treatment plan. She is planning to have a left lumpectomy and sentinel node biopsy followed by chemotherapy, and radiation. She will benefit from a post op PT reassessment to determine needs.    Stability/Clinical Decision Making  Stable/Uncomplicated    Clinical Decision Making  Low    Rehab Potential  Excellent    PT Frequency  --   Eval and 1 f/u visit   PT Treatment/Interventions  Therapeutic exercise;Patient/family  education;ADLs/Self Care Home Management    PT Next Visit Plan  Will reassess 3-4 weeks post op to determine needs    PT Home Exercise Plan  Post op shoulder ROM HEP    Consulted and Agree with Plan of Care  Patient       Patient will benefit from skilled therapeutic intervention in order to improve the following deficits and impairments:  Postural dysfunction, Decreased knowledge of precautions, Decreased range of motion, Impaired UE functional use, Pain  Visit Diagnosis: Malignant neoplasm of upper-inner quadrant of left breast in female, estrogen receptor negative ([place]) - Plan: PT plan of care cert/re-cert  Abnormal posture - Plan: PT plan of care cert/re-cert   Patient will follow up at outpatient cancer rehab 3-4 weeks following surgery.  If the patient requires physical therapy at that time, a specific plan will be dictated and sent to the referring physician for  approval. The patient was educated today on appropriate basic range of motion exercises to begin post operatively and the importance of attending the After Breast Cancer class following surgery.  Patient was educated today on lymphedema risk reduction practices as it pertains to recommendations that will benefit the patient immediately following surgery.  She verbalized good understanding.      Problem List Patient Active Problem List   Diagnosis Date Noted  . Malignant neoplasm of upper-inner quadrant of left breast in female, estrogen receptor negative ([place]) 02/17/2019  . Overweight 12/29/2017  . History of chicken pox   . Screening for malignant neoplasm of cervix 06/20/2011  . Preventative health care 06/20/2011  . Disorder of eye movements 04/11/2009  . MICROSCOPIC HEMATURIA 02/18/2008  . HYPERGLYCEMIA 02/18/2008  . Depression 01/20/2008  . Migraine headache 01/20/2008  . Allergic rhinitis 01/20/2008   Annia Friendly, PT 02/18/19 6:00 PM  [place], [place], [address] Phone: [phone]   Fax:  [phone]  Name: MARESHA ANASTOS MRN: [id number] Date of Birth: 1951/01/18

## 2019-02-18 NOTE — Progress Notes (Signed)
[place]   [phone] Fax:([phone]) [place] Note   Patient Care Team: Mosie Lukes, MD as PCP - General (Family Medicine) Cherylann Banas, Cherly Anderson, MD (Inactive) (Obstetrics and Gynecology) Rockwell Germany, RN as Oncology Nurse Navigator Mauro Kaufmann, RN as Oncology Nurse Navigator Erroll Luna, MD as Consulting Physician (General Surgery) Truitt Merle, MD as Consulting Physician (Hematology) Gery Pray, MD as Consulting Physician (Radiation Oncology)  Date of Service:  02/18/2019   CHIEF COMPLAINTS/PURPOSE OF CONSULTATION:  Newly Diagnosed Malignant neoplasm of upper-inner quadrant of left breast   Oncology History Overview Note  Cancer Staging Malignant neoplasm of upper-inner quadrant of left breast in female, estrogen receptor negative ([place]) Staging form: Breast, AJCC 8th Edition - Clinical stage from 02/06/2019: Stage IB (cT1c, cN0, cM0, G2, ER-, PR-, HER2-) - Signed by Truitt Merle, MD on 02/17/2019    Malignant neoplasm of upper-inner quadrant of left breast in female, estrogen receptor negative ([place])  02/04/2019 Mammogram   Diagnostic mammogram 02/04/19  IMPRESSION: Highly suspicious 1.7 x1.5 x1.7cm at 10:00 position of UPPER INNER LEFT breast mass 10 cm from nipple corresponding to the screening study finding. Tissue sampling recommended.   No abnormal LEFT axillary lymph nodes.   02/06/2019 Cancer Staging   Staging form: Breast, AJCC 8th Edition - Clinical stage from 02/06/2019: Stage IB (cT1c, cN0, cM0, G2, ER-, PR-, HER2-) - Signed by Truitt Merle, MD on 02/17/2019   02/06/2019 Initial Biopsy   Diagnosis 02/06/19 Breast, left, needle core biopsy, 10 o'clock, 10cm/n, ribbon clip - INVASIVE MAMMARY CARCINOMA.   02/06/2019 Receptors her2   Results: IMMUNOHISTOCHEMICAL AND MORPHOMETRIC ANALYSIS PERFORMED MANUALLY The tumor cells are NEGATIVE for Her2 (1+). Estrogen Receptor: 0%, NEGATIVE Progesterone Receptor: 0%,  NEGATIVE Proliferation Marker Ki67: 40%   02/17/2019 Initial Diagnosis   Malignant neoplasm of upper-inner quadrant of left breast in female, estrogen receptor negative ([place])      HISTORY OF PRESENTING ILLNESS:  Cindy [name] 68 y.o. female is a here because of newly diagnosed left breast cancer. The patient presents to the Breast clinic today alone.  She notes she felt the left breast mass herself with pain a week before her mammogram. She has dense breast tissue and has been called back for repeat scans before. No previous biopsy. She notes in the last week weakness and shakiness intermittently. She notes stable and known hip pain which is very mild. No other new pain. She denies change in weight, appetite or other breast changes. She notes she has been anxious about her overall diagnosis.   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 She is single with no children. She lately does not drink alcohol due to acid reflux. Other wise 1-2 glasses of wine a week. She quit smoking 35-35 year ago after smoking less than a ppd intermittently for 10 years. She feels she has good support system in her friends. She has only a cousin in town.  She has depression, on Prozac and Xanax. She has no other significant past medical history, She did have eye surgery from orbital Palsy and Uterine polyp removed, no hysterectomy. Her mother had colon cancer in her 23s. Her father had bladder cancer. Her MGF had colon cancer. She has had colonoscopy which she is overdue.    GYN HISTORY  Menarchal: 13 LMP: early 82s Contraceptive: early 21s  HRT: No G1P0A1: Abortion    REVIEW OF SYSTEMS:    Constitutional: Denies fevers, chills or abnormal night  sweats Eyes: Denies blurriness of vision, double vision or watery eyes Ears, nose, mouth, throat, and face: Denies mucositis or sore throat Respiratory: Denies cough, dyspnea or wheezes Cardiovascular: Denies palpitation, chest discomfort or lower extremity  swelling Gastrointestinal:  Denies nausea, heartburn or change in bowel habits Skin: Denies abnormal skin rashes MKS: (+) Chronic intermittent hip pain, mild  Lymphatics: Denies new lymphadenopathy or easy bruising Neurological:Denies numbness, tingling or new weaknesses Behavioral/Psych: Mood is stable, no new changes  All other systems were reviewed with the patient and are negative.  MEDICAL HISTORY:  Past Medical History:  Diagnosis Date   Allergy    Depression    ? if bipolar works the best   Elevated fasting blood sugar    hx   H/O measles    History of chicken pox    Hx of migraines    Microscopic hematuria    advised to see urology ? never went   Preventative health care 06/20/2011    SURGICAL HISTORY: Past Surgical History:  Procedure Laterality Date   endometrial polyps     2 surgeries by Dr Cathlean Cower   EYE SURGERY  04/29/2009   Rt eye tighten up    SOCIAL HISTORY: Social History   Socioeconomic History   Marital status: Single    Spouse name: Not on file   Number of children: Not on file   Years of education: Not on file   Highest education level: Not on file  Occupational History   Occupation: retired   Scientist, product/process development strain: Not on file   Food insecurity    Worry: Not on file    Inability: Not on Lexicographer needs    Medical: Not on file    Non-medical: Not on file  Tobacco Use   Smoking status: Former Smoker    Packs/day: 0.25    Years: 10.00    Pack years: 2.50   Smokeless tobacco: Never Used   Tobacco comment: social smoker in her 20-30's   Substance and Sexual Activity   Alcohol use: Not Currently    Alcohol/week: 1.0 - 2.0 standard drinks    Types: 1 - 2 Glasses of wine per week   Drug use: No   Sexual activity: Not on file  Lifestyle   Physical activity    Days per week: Not on file    Minutes per session: Not on file   Stress: Not on file  Relationships   Social  connections    Talks on phone: Not on file    Gets together: Not on file    Attends religious service: Not on file    Active member of club or organization: Not on file    Attends meetings of clubs or organizations: Not on file    Relationship status: Not on file   Intimate partner violence    Fear of current or ex partner: Not on file    Emotionally abused: Not on file    Physically abused: Not on file    Forced sexual activity: Not on file  Other Topics Concern   Not on file  Social History Narrative   HH of 1   2 Cats   Caffeine 2 mugs   Exercise recently some, no major dietary restrictions at this time. Was vegan now eats chicken and poultry   Works at [employer] in [place]: Family History  Problem Relation Age of Onset   Alzheimer's disease Mother  Cancer Mother 70       colon   Thyroid disease Father    COPD Father    Cancer Father        bladder with cystectomy   Cancer Maternal Grandfather        colon cancer    ALLERGIES:  is allergic to bromfed and venlafaxine.  MEDICATIONS:  Current Outpatient Medications  Medication Sig Dispense Refill   ALPRAZolam (XANAX) 0.25 MG tablet Take 1 tablet (0.25 mg total) by mouth 2 (two) times daily as needed for anxiety. 20 tablet 0   FLUoxetine (PROZAC) 40 MG capsule Take 1 capsule (40 mg total) by mouth daily. 90 capsule 3   No current facility-administered medications for this visit.     PHYSICAL EXAMINATION: ECOG PERFORMANCE STATUS: 0 - Asymptomatic  Vitals:   02/18/19 1237  BP: 113/66  Pulse: 83  Resp: 18  Temp: 98.3 F (36.8 C)  SpO2: 99%   Filed Weights   02/18/19 1237  Weight: 164 lb (74.4 kg)    GENERAL:alert, no distress and comfortable SKIN: skin color, texture, turgor are normal, no rashes (+) 4.5cm erythematous lesion in LIQ of right breast, well demarcated (+) multiple skin moles of b/l breasts. EYES: normal, Conjunctiva are pink and non-injected, sclera clear  NECK:  supple, thyroid normal size, non-tender, without nodularity LYMPH:  no palpable lymphadenopathy in the cervical, axillary  LUNGS: clear to auscultation and percussion with normal breathing effort HEART: regular rate & rhythm and no murmurs and no lower extremity edema ABDOMEN:abdomen soft, non-tender and normal bowel sounds Musculoskeletal:no cyanosis of digits and no clubbing  NEURO: alert & oriented x 3 with fluent speech, no focal motor/sensory deficits BREAST: (+) 1.5cm mass of left breast, tender. No palpable adenopathy bilaterally.   LABORATORY DATA:  I have reviewed the data as listed CBC Latest Ref Rng & Units 02/18/2019 12/27/2017 12/21/2016  WBC 4.0 - 10.5 K/uL 6.4 5.7 6.6  Hemoglobin 12.0 - 15.0 g/dL 14.4 14.0 13.8  Hematocrit 36.0 - 46.0 % 42.4 41.1 40.2  Platelets 150 - 400 K/uL 281 247.0 258.0    CMP Latest Ref Rng & Units 02/18/2019 01/30/2018 12/27/2017  Glucose 70 - 99 mg/dL 88 119(H) 116(H)  BUN 8 - 23 mg/dL 19 14 24(H)  Creatinine 0.44 - 1.00 mg/dL 0.98 0.93 0.89  Sodium 135 - 145 mmol/L 140 139 142  Potassium 3.5 - 5.1 mmol/L 4.4 4.6 5.5(H)  Chloride 98 - 111 mmol/L 102 104 108  CO2 22 - 32 mmol/L _0 Calcium 8.9 - 10.3 mg/dL 9.7 9.5 9.5  Total Protein 6.5 - 8.1 g/dL 7.6 7.3 7.1  Total Bilirubin 0.3 - 1.2 mg/dL 0.4 0.5 0.4  Alkaline Phos 38 - 126 U/L 65 59 61  AST 15 - 41 U/L _1 ALT 0 - 44 U/L _2 RADIOGRAPHIC STUDIES: I have personally reviewed the radiological images as listed and agreed with the findings in the report. US Breast Ltd Uni Left Inc Axilla  Result Date: 02/04/2019 CLINICAL DATA:  68 year old female for further evaluation of possible LEFT breast mass on screening mammogram. EXAM: DIGITAL DIAGNOSTIC LEFT MAMMOGRAM WITH CAD AND TOMO ULTRASOUND LEFT BREAST COMPARISON:  Previous exam(s). ACR Breast Density Category c: The breast tissue is heterogeneously dense, which may obscure small masses. FINDINGS: 2D/3D spot compression views of  the LEFT breast demonstrate a persistent 1.7 cm irregular mass within the posterior UPPER INNER LEFT breast. Mammographic images were  processed with CAD. On physical exam, focal thickening at the 10 o'clock position of the LEFT breast 10 cm from the nipple is identified. Targeted ultrasound is performed, showing a 1.7 x 1.5 x 1.7 cm irregular hypoechoic mass at the 10 o'clock position of the LEFT breast 10 cm from the nipple. No abnormal LEFT axillary lymph nodes are identified. IMPRESSION: Highly suspicious 1.7 cm UPPER INNER LEFT breast mass corresponding to the screening study finding. Tissue sampling recommended. No abnormal LEFT axillary lymph nodes. RECOMMENDATION: Ultrasound-guided LEFT breast biopsy, which will be scheduled. I have discussed the findings and recommendations with the patient. If applicable, a reminder letter will be sent to the patient regarding the next appointment. BI-RADS CATEGORY  5: Highly suggestive of malignancy. Electronically Signed   By: Margarette Canada M.D.   On: 02/04/2019 11:58   Mm Diag Breast Tomo Uni Left  Result Date: 02/04/2019 CLINICAL DATA:  68 year old female for further evaluation of possible LEFT breast mass on screening mammogram. EXAM: DIGITAL DIAGNOSTIC LEFT MAMMOGRAM WITH CAD AND TOMO ULTRASOUND LEFT BREAST COMPARISON:  Previous exam(s). ACR Breast Density Category c: The breast tissue is heterogeneously dense, which may obscure small masses. FINDINGS: 2D/3D spot compression views of the LEFT breast demonstrate a persistent 1.7 cm irregular mass within the posterior UPPER INNER LEFT breast. Mammographic images were processed with CAD. On physical exam, focal thickening at the 10 o'clock position of the LEFT breast 10 cm from the nipple is identified. Targeted ultrasound is performed, showing a 1.7 x 1.5 x 1.7 cm irregular hypoechoic mass at the 10 o'clock position of the LEFT breast 10 cm from the nipple. No abnormal LEFT axillary lymph nodes are identified.  IMPRESSION: Highly suspicious 1.7 cm UPPER INNER LEFT breast mass corresponding to the screening study finding. Tissue sampling recommended. No abnormal LEFT axillary lymph nodes. RECOMMENDATION: Ultrasound-guided LEFT breast biopsy, which will be scheduled. I have discussed the findings and recommendations with the patient. If applicable, a reminder letter will be sent to the patient regarding the next appointment. BI-RADS CATEGORY  5: Highly suggestive of malignancy. Electronically Signed   By: Margarette Canada M.D.   On: 02/04/2019 11:58   Mm 3d Screen Breast Bilateral  Result Date: 01/27/2019 CLINICAL DATA:  Screening. EXAM: DIGITAL SCREENING BILATERAL MAMMOGRAM WITH TOMO AND CAD COMPARISON:  Previous exam(s). ACR Breast Density Category c: The breast tissue is heterogeneously dense, which may obscure small masses. FINDINGS: In the left breast, a possible mass associated with distortion warrants further evaluation. In the right breast, no findings suspicious for malignancy. Images were processed with CAD. IMPRESSION: Further evaluation is suggested for possible mass associated with distortion in the left breast. RECOMMENDATION: Diagnostic mammogram and possibly ultrasound of the left breast. (Code:[id number]-[id number]) The patient will be contacted regarding the findings, and additional imaging will be scheduled. BI-RADS CATEGORY  0: Incomplete. Need additional imaging evaluation and/or prior mammograms for comparison. Electronically Signed   By: Evangeline Dakin M.D.   On: 01/27/2019 16:38   Mm Clip Placement Left  Result Date: 02/06/2019 CLINICAL DATA:  Status post ultrasound-guided core biopsy of mass in the LEFT breast 10 location. EXAM: DIAGNOSTIC LEFT MAMMOGRAM POST ULTRASOUND BIOPSY COMPARISON:  Previous exam(s). FINDINGS: Mammographic images were obtained following ultrasound guided biopsy of mass in the 10 o'clock location of the LEFT breast and placement of a ribbon shaped clip. The clip is identified within  the mass in the [place] of the LEFT breast. IMPRESSION: Tissue marker clip in the expected location  following biopsy. Final Assessment: Post Procedure Mammograms for Marker Placement Electronically Signed   By: Nolon Nations M.D.   On: 02/06/2019 15:46   [place] Lt Breast Bx W Loc Dev 1st Lesion Img Bx Spec US Guide  Addendum Date: 02/09/2019   ADDENDUM REPORT: 02/09/2019 13:19 ADDENDUM: Pathology revealed GRADE II-III INVASIVE MAMMARY CARCINOMA of the LEFT breast, 10 o'clock, 10cm/n, (ribbon clip). This was found to be concordant by Dr. Nolon Nations. Pathology results were discussed with the patient by telephone. The patient reported doing well after the biopsy with tenderness at the site. Post biopsy instructions and care were reviewed and questions were answered. The patient was encouraged to call [facility] for any additional concerns. The patient was referred to [facility] at [facility] on February 18, 2019. Pathology results reported by Terie Purser, RN on 02/09/2019. Electronically Signed   By: Nolon Nations M.D.   On: 02/09/2019 13:19   Result Date: 02/09/2019 CLINICAL DATA:  Patient presents for ultrasound-guided core biopsy of mass in the LEFT breast. EXAM: ULTRASOUND GUIDED LEFT BREAST CORE NEEDLE BIOPSY COMPARISON:  Previous exam(s). FINDINGS: I met with the patient and we discussed the procedure of ultrasound-guided biopsy, including benefits and alternatives. We discussed the high likelihood of a successful procedure. We discussed the risks of the procedure, including infection, bleeding, tissue injury, clip migration, and inadequate sampling. Informed written consent was given. The usual time-out protocol was performed immediately prior to the procedure. Lesion quadrant: UPPER INNER QUADRANT LEFT breast Using sterile technique and 1% Lidocaine as local anesthetic, under direct ultrasound  visualization, a 12 gauge spring-loaded device was used to perform biopsy of mass in the 10 o'clock location of the LEFT breast 10 centimeters from the nipple using a LATERAL to MEDIAL approach. At the conclusion of the procedure a ribbon shaped tissue marker clip was deployed into the biopsy cavity. Follow up 2 view mammogram was performed and dictated separately. IMPRESSION: Ultrasound guided biopsy of LEFT breast mass. No apparent complications. Electronically Signed: By: Nolon Nations M.D. On: 02/06/2019 15:30    ASSESSMENT & PLAN:  Cindy [name] is a 68 y.o. Caucasian female with a history of depression.   1 Malignant neoplasm of upper-inner quadrant of left breast, Stage IB, c(T1cN0M0), ER/PR/HER2-, triple negative, Grade II -We discussed her image findings and the biopsy results in great details. She has Grade II invasive ductal carcinoma.  -Given the early stage disease, she is likely a candidate for umpectomy with sentinel LN biopsy. She is agreeable with that. She was seen by Dr. Malachi Paradise today and likely will proceed with surgery soon.  -The risk of recurrence depends on the stage and biology of the tumor. She has early stage disease, however this is triple negative disease, which is more aggressive, which predicts at least moderate risk for future recurrence. -She has steel plate in her head. Will not proceed with breast MRI.  -To reduce her risk of recurrence standard therapy is chemotherapy. I recommend adjuvant chemo. We discussed the options of docetaxel and Cytoxan every 3 weeks for 4 cycles, and more agressive regimen AC-T (dose dense Adriamycin and Cytoxan every 2 weeks for 4 cycles, followed by weekly or biweekly Taxol for 2-3 months), or AC-TC (adding carboplatin to Taxol). If her tumor is more than 2cm or LN positive, I will recommend whole CT/bone scan for staging, and AC-T, or AC-TC chemo regimen. --Chemotherapy consent: Side effects including but  does not not limited to,  fatigue, nausea, vomiting, diarrhea, hair loss, neuropathy, fluid retention, renal and kidney dysfunction, neutropenic fever, needed for blood transfusion, bleeding, small risk of congestive heart failure and leukemia from Adriamycin, were discussed with patient in great detail. She agrees to proceed. -We also discussed the option of DiginCap for hair preservation. She will think about it   -The goal of therapy is curative -She was also seen by radiation oncologist Dr. Sondra Come today. If her surgical sentinel lymph nodes were negative, she would not need post mastectomy radiation. Otherwise radiation is recommended to reduce the risk for local recurrence.  -Given ER and PR negative expression, antiestrogen therapy is not recommended.  -Physical exam showed left breast 1.5 cm mass. She also had multiple skin lesion of her breasts and chest. Labs reviewed, CBC and CMP WLN.  -As she proceeds with treatment I recommend she have a strong support system.  -Proceed with surgery first, and f/u with me in 2 weeks postop    2. Multiple Skin lesions in chest  -She has multiple moles and skin lesions of her b/l breasts and chest. The largest skin lesion on right breast measuring 4x5cm.  -I recommend she have this evaluated by her dermatologist. She is agreeable.     PLAN:  -Proceed with surgery, with port placement  -F/u 2 weeks after surgery, to finalize her adjuvant chemo     No orders of the defined types were placed in this encounter.   All questions were answered. The patient knows to call the clinic with any problems, questions or concerns. I spent 45 minutes counseling the patient face to face. The total time spent in the appointment was 60 minutes and more than 50% was on counseling.     Truitt Merle, MD 02/18/2019 5:24 PM  I, Joslyn Devon, am acting as scribe for Truitt Merle, MD.   I have reviewed the above documentation for accuracy and completeness, and I agree with the above.

## 2019-02-18 NOTE — Patient Instructions (Signed)

## 2019-02-18 NOTE — H&P (View-Only) (Signed)
Cindy [name] Documented: 02/18/2019 7:27 AM Location: [facility] Patient #: [id number] DOB: [birth year] Undefined / Language: Cindy [name] / Race: White Female  History of Present Illness Cindy Moores A. Cornett [name]; 02/18/2019 2:28 PM) Patient words: Pt sent at the request of Dr Melanee Spry for SDM abnormality of her left breast. 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 Pt felt a knot there for 1 month. No other masses or discharge noted from either breasts             REASON FOR ADDENDUM, AMENDMENT OR CORRECTION: [id number]: REASON FOR ADDENDUM: Adding E-cadherin results. 02/10/19 09:32:14 AM (AC:ah) ADDITIONAL INFORMATION: PROGNOSTIC INDICATORS Results: IMMUNOHISTOCHEMICAL AND MORPHOMETRIC ANALYSIS PERFORMED MANUALLY The tumor cells are NEGATIVE for Her2 (1+). Estrogen Receptor: 0%, NEGATIVE Progesterone Receptor: 0%, NEGATIVE Proliferation Marker Ki67: 40% COMMENT: The negative hormone receptor study(ies) in this case has no internal positive control. REFERENCE RANGE ESTROGEN RECEPTOR NEGATIVE 0% POSITIVE =>1% REFERENCE RANGE PROGESTERONE RECEPTOR NEGATIVE 0% POSITIVE =>1% All controls stained appropriately Cindy Sheller [name] Pathologist, Electronic Signature ( Signed 02/10/2019) FINAL DIAGNOSIS 1 of 3 Amended copy Addendum FINAL for Cindy [name], Cindy [name] [phone]) Diagnosis Breast, left, needle core biopsy, 10 o'clock, 10cm/n, ribbon clip - INVASIVE MAMMARY CARCINOMA. Microscopic Comment The carcinoma appears grade 2-3. The greatest linear extent of tumor in any one core is 12 mm. E-cadherin will be reported separately. Ancillary studies will be reported separately. Results reported to Cindy [name] on 02/09/2019. Intradepartmental consultation (Dr. Melina [name]). ADDENDUM: Immunohistochemistry for E-cadherin is positive consistent with a ductal phenotype. Cindy [name] Pathologist, Electronic Signature (Case signed  02/09/2019) Corrected Report Signer Cindy [name] Pathologist, Electronic Signature (Case signed 02/10/2019) Specimen Gross and Clinical Information Specimen Comment Formalin time: 3:20 PM; extracted < 5 minutes; suspicious mass Specimen([name]) Obtained: Breast, left, needle core biopsy, 10 o'clock, 10cm/n, ribbon clip Specimen Clinical Information Suspect [facility] Gross Received in formalin (time in formalin 3:20 p.m., cold ischemic time less than five minutes) are three cores of soft tan yellow tissue measuring 1.6 to 1.8 cm in length and each 0.2 cm diameter. The specimen is submitted in toto. (GP:gt, 02/09/19) Stain(            68 year old female for further evaluation of possible LEFT breast mass on screening mammogram.  EXAM: DIGITAL DIAGNOSTIC LEFT MAMMOGRAM WITH CAD AND TOMO  ULTRASOUND LEFT BREAST  COMPARISON: Previous exam([name]).  ACR Breast Density Category c: The breast tissue is heterogeneously dense, which may obscure small masses.  FINDINGS: 2D/3D spot compression views of the LEFT breast demonstrate a persistent 1.7 cm irregular mass within the posterior UPPER INNER LEFT breast.  Mammographic images were processed with CAD.  On physical exam, focal thickening at the 10 o'clock position of the LEFT breast 10 cm from the nipple is identified.  Targeted ultrasound is performed, showing a 1.7 x 1.5 x 1.7 cm irregular hypoechoic mass at the 10 o'clock position of the LEFT breast 10 cm from the nipple.  No abnormal LEFT axillary lymph nodes are identified.  IMPRESSION: Highly suspicious 1.7 cm UPPER INNER LEFT breast mass corresponding to the screening study finding. Tissue sampling recommended.  No abnormal LEFT axillary lymph nodes.  RECOMMENDATION: Ultrasound-guided LEFT breast biopsy, which will be scheduled.  I have discussed the findings and recommendations with the patient. If applicable, a reminder letter will be sent to the  patient regarding the next appointment.  BI-RADS CATEGORY 5: Highly suggestive of malignancy.   Electronically Signed By: Cindy [name] M.D. On: 02/04/2019 11:58.  The patient is a 68 year old female.   Past Surgical History Cindy Pummel, RN; 02/18/2019 7:27 AM) Breast Biopsy Left. Colon Polyp Removal - Colonoscopy Oral Surgery  Diagnostic Studies History Cindy Pummel, RN; 02/18/2019 7:27 AM) Colonoscopy 5-10 years ago Mammogram within last year Pap Smear >5 years ago  Medication History Cindy Pummel, RN; 02/18/2019 7:27 AM) Medications Reconciled  Social History Cindy Pummel, RN; 02/18/2019 7:27 AM) Alcohol use Moderate alcohol use. Caffeine use Coffee. No drug use Tobacco use Former smoker.  Family History Cindy Pummel, RN; 02/18/2019 7:27 AM) Arthritis Mother. Cancer Father. Colon Cancer Family Members In General, Mother. Hypertension Family Members In General. Melanoma Family Members In General, Father. Respiratory Condition Father.  Pregnancy / Birth History Cindy Pummel, RN; 02/18/2019 7:27 AM) Age at menarche 66 years. Age of menopause 12-55 Gravida 1 Irregular periods Maternal age 43-35  Other Problems Cindy Pummel, RN; 02/18/2019 7:27 AM) Depression Gastroesophageal Reflux Disease Migraine Headache     Review of Systems Cindy Spillers Ledford RN; 02/18/2019 7:27 AM) General Present- Fatigue. Not Present- Appetite Loss, Chills, Fever, Night Sweats, Weight Gain and Weight Loss. Skin Not Present- Change in Wart/Mole, Dryness, Hives, Jaundice, New Lesions, Non-Healing Wounds, Rash and Ulcer. HEENT Not Present- Earache, Hearing Loss, Hoarseness, Nose Bleed, Oral Ulcers, Ringing in the Ears, Seasonal Allergies, Sinus Pain, Sore Throat, Visual Disturbances, Wears glasses/contact lenses and Yellow Eyes. Respiratory Present- Chronic Cough. Not Present- Bloody sputum, Difficulty Breathing, Snoring and Wheezing. Breast Not Present-  Breast Mass, Breast Pain, Nipple Discharge and Skin Changes. Cardiovascular Not Present- Chest Pain, Difficulty Breathing Lying Down, Leg Cramps, Palpitations, Rapid Heart Rate, Shortness of Breath and Swelling of Extremities. Gastrointestinal Present- Abdominal Pain. Not Present- Bloating, Bloody Stool, Change in Bowel Habits, Chronic diarrhea, Constipation, Difficulty Swallowing, Excessive gas, Gets full quickly at meals, Hemorrhoids, Indigestion, Nausea, Rectal Pain and Vomiting. Female Genitourinary Not Present- Frequency, Nocturia, Painful Urination, Pelvic Pain and Urgency. Musculoskeletal Not Present- Back Pain, Joint Pain, Joint Stiffness, Muscle Pain, Muscle Weakness and Swelling of Extremities. Neurological Present- Weakness. Not Present- Decreased Memory, Fainting, Headaches, Numbness, Seizures, Tingling, Tremor and Trouble walking. Psychiatric Not Present- Anxiety, Bipolar, Change in Sleep Pattern, Depression, Fearful and Frequent crying. Endocrine Not Present- Cold Intolerance, Excessive Hunger, Hair Changes, Heat Intolerance, Hot flashes and New Diabetes. Hematology Not Present- Blood Thinners, Easy Bruising, Excessive bleeding, Gland problems, HIV and Persistent Infections.   Physical Exam (Thomas A. Cornett [name]; 02/18/2019 2:19 PM)  General Mental Status-Alert. General Appearance-Consistent with stated age. Hydration-Well hydrated. Voice-Normal.  Head and Neck Head-normocephalic, atraumatic with no lesions or palpable masses. Trachea-midline. Thyroid Gland Characteristics - normal size and consistency.  Chest and Lung Exam Chest and lung exam reveals -quiet, even and easy respiratory effort with no use of accessory muscles and on auscultation, normal breath sounds, no adventitious sounds and normal vocal resonance. Inspection Chest Wall - Normal. Back - normal.  Breast Breast - Left-Symmetric, Non Tender, No Biopsy scars, no Dimpling - Left, No  Inflammation, No Lumpectomy scars, No Mastectomy scars, No Peau d' Orange. Breast - Right-Symmetric, Non Tender, No Biopsy scars, no Dimpling - Right, No Inflammation, No Lumpectomy scars, No Mastectomy scars, No Peau d' Orange. Breast Lump-No Palpable Breast Mass. Note: SWELLING UIQ left breast  Cardiovascular Cardiovascular examination reveals -normal heart sounds, regular rate and rhythm with no murmurs and normal pedal pulses bilaterally.  Neurologic Neurologic evaluation reveals -alert and oriented x 3 with no impairment of recent or remote memory. Mental Status-Normal.  Musculoskeletal Normal Exam -  Left-Upper Extremity Strength Normal and Lower Extremity Strength Normal. Normal Exam - Right-Upper Extremity Strength Normal and Lower Extremity Strength Normal.    Assessment & Plan (Ryoma Nofziger A. 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 Pt agrees to proceed.  Risk of sentinel lymph node mapping include bleeding, infection, lymphedema, shoulder pain. stiffness, dye allergy. cosmetic deformity , blood clots, death, need for more surgery. Pt agres to proceed. Pt requires port placement for chemotherapy. Risk include bleeding, infection, pneumothorax, hemothorax, mediastinal injury, nerve injury , blood vessel injury, stroke, blood clots, death, migration. embolization and need for additional procedures. Pt agrees to proceed.  Current Plans You are being scheduled for surgery- Our schedulers will call you.  You should hear from our office'[name] scheduling department within 5 working days about the location, date, and time of surgery. We try to make accommodations for patient'[name] preferences in  scheduling surgery, but sometimes the OR schedule or the surgeon'[name] schedule prevents [place] from making those accommodations.  If you have not heard from our office [phone]) in 5 working days, call the office and ask for your surgeon'[name] nurse.  If you have other questions about your diagnosis, plan, or surgery, call the office and ask for your surgeon'[name] nurse.  Pt Education - [facility] Breast Cancer Information Given - [facility] "Breast Journey" Package We discussed the staging and pathophysiology of breast cancer. We discussed all of the different options for treatment for breast cancer including surgery, chemotherapy, radiation therapy, Herceptin, and antiestrogen therapy. We discussed a sentinel lymph node biopsy as she does not appear to having lymph node involvement right now. We discussed the performance of that with injection of radioactive tracer and blue dye. We discussed that she would have an incision underneath her axillary hairline. We discussed that there is a bout a 10-20% chance of having a positive node with a sentinel lymph node biopsy and we will await the permanent pathology to make any other first further decisions in terms of her treatment. One of these options might be to return to the operating room to perform an axillary lymph node dissection. We discussed about a 1-2% risk lifetime of chronic shoulder pain as well as lymphedema associated with a sentinel lymph node biopsy. We discussed the options for treatment of the breast cancer which included lumpectomy versus a mastectomy. We discussed the performance of the lumpectomy with a wire placement. We discussed a 10-20% chance of a positive margin requiring reexcision in the operating room. We also discussed that she may need radiation therapy or antiestrogen therapy or both if she undergoes lumpectomy. We discussed the mastectomy and the postoperative care for that as well. We discussed that there is no difference in her survival whether  she undergoes lumpectomy with radiation therapy or antiestrogen therapy versus a mastectomy. There is a slight difference in the local recurrence rate being 3-5% with lumpectomy and about 1% with a mastectomy. We discussed the risks of operation including bleeding, infection, possible reoperation. She understands her further therapy will be based on what her stages at the time of her operation.  Pt Education - [facility] breast cancer surgery: discussed with patient and provided information. Pt Education - [facility] Breast Biopsy HCI: discussed with patient and provided information. Use of  a central venous catheter for intravenous therapy was discussed. Technique of catheter placement using ultrasound and fluoroscopy guidance was discussed. Risks such as bleeding, infection, pneumothorax, catheter occlusion, reoperation, and other risks were discussed. I noted a good likelihood this will help address the problem. Questions were answered. The patient expressed understanding & wishes to proceed.
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
## 2019-02-18 NOTE — H&P (Signed)
Cindy [name] Documented: 02/18/2019 7:27 AM Location: [facility] Patient #: [id number] DOB: 02/08/1951 Undefined / Language: English / Race: White Female  History of Present Illness (Cindy [name] A. Amillia Biffle MD; 02/18/2019 2:28 PM) Patient words: Pt sent at the request of Dr Hu for SDM abnormality of her left breast. 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 Pt felt a knot there for 1 month. No other masses or discharge noted from either breasts             REASON FOR ADDENDUM, AMENDMENT OR CORRECTION: [id number]: REASON FOR ADDENDUM: Adding E-cadherin results. 02/10/19 09:32:14 AM (AC:ah) ADDITIONAL INFORMATION: PROGNOSTIC INDICATORS Results: IMMUNOHISTOCHEMICAL AND MORPHOMETRIC ANALYSIS PERFORMED MANUALLY The tumor cells are NEGATIVE for Her2 (1+). Estrogen Receptor: 0%, NEGATIVE Progesterone Receptor: 0%, NEGATIVE Proliferation Marker Ki67: 40% COMMENT: The negative hormone receptor study(ies) in this case has no internal positive control. REFERENCE RANGE ESTROGEN RECEPTOR NEGATIVE 0% POSITIVE =>1% REFERENCE RANGE PROGESTERONE RECEPTOR NEGATIVE 0% POSITIVE =>1% All controls stained appropriately DAWN BUTLER MD Pathologist, Electronic Signature ( Signed 02/10/2019) FINAL DIAGNOSIS 1 of 3 Amended copy Addendum FINAL for Cindy [name], Cindy [name] ([id number]) Diagnosis Breast, left, needle core biopsy, 10 o'clock, 10cm/n, ribbon clip - INVASIVE MAMMARY CARCINOMA. Microscopic Comment The carcinoma appears grade 2-3. The greatest linear extent of tumor in any one core is 12 mm. E-cadherin will be reported separately. Ancillary studies will be reported separately. Results reported to [facility] on 02/09/2019. Intradepartmental consultation (Dr. Butler). ADDENDUM: Immunohistochemistry for E-cadherin is positive consistent with a ductal phenotype. Anastasia Canacci MD Pathologist, Electronic Signature (Case signed  02/09/2019) Corrected Report Signer Anastasia Canacci MD Pathologist, Electronic Signature (Case signed 02/10/2019) Specimen Gross and Clinical Information Specimen Comment Formalin time: 3:20 PM; extracted < 5 minutes; suspicious mass Specimen([name]) Obtained: Breast, left, needle core biopsy, 10 o'clock, 10cm/n, ribbon clip Specimen Clinical Information Suspect IMC Gross Received in formalin (time in formalin 3:20 p.m., cold ischemic time less than five minutes) are three cores of soft tan yellow tissue measuring 1.6 to 1.8 cm in length and each 0.2 cm diameter. The specimen is submitted in toto. (GP:gt, 02/09/19) Stain(            68-year-old female for further evaluation of possible LEFT breast mass on screening mammogram.  EXAM: DIGITAL DIAGNOSTIC LEFT MAMMOGRAM WITH CAD AND TOMO  ULTRASOUND LEFT BREAST  COMPARISON: Previous exam([name]).  ACR Breast Density Category c: The breast tissue is heterogeneously dense, which may obscure small masses.  FINDINGS: 2D/3D spot compression views of the LEFT breast demonstrate a persistent 1.7 cm irregular mass within the posterior UPPER INNER LEFT breast.  Mammographic images were processed with CAD.  On physical exam, focal thickening at the 10 o'clock position of the LEFT breast 10 cm from the nipple is identified.  Targeted ultrasound is performed, showing a 1.7 x 1.5 x 1.7 cm irregular hypoechoic mass at the 10 o'clock position of the LEFT breast 10 cm from the nipple.  No abnormal LEFT axillary lymph nodes are identified.  IMPRESSION: Highly suspicious 1.7 cm UPPER INNER LEFT breast mass corresponding to the screening study finding. Tissue sampling recommended.  No abnormal LEFT axillary lymph nodes.  RECOMMENDATION: Ultrasound-guided LEFT breast biopsy, which will be scheduled.  I have discussed the findings and recommendations with the patient. If applicable, a reminder letter will be sent to the  patient regarding the next appointment.  BI-RADS CATEGORY 5: Highly suggestive of malignancy.   Electronically Signed By: Jeffrey Hu M.D. On: 02/04/2019 11:58.    The patient is a 68 year old female.   Past Surgical History (Sylvia Ledford, RN; 02/18/2019 7:27 AM) Breast Biopsy Left. Colon Polyp Removal - Colonoscopy Oral Surgery  Diagnostic Studies History (Sylvia Ledford, RN; 02/18/2019 7:27 AM) Colonoscopy 5-10 years ago Mammogram within last year Pap Smear >5 years ago  Medication History (Sylvia Ledford, RN; 02/18/2019 7:27 AM) Medications Reconciled  Social History (Sylvia Ledford, RN; 02/18/2019 7:27 AM) Alcohol use Moderate alcohol use. Caffeine use Coffee. No drug use Tobacco use Former smoker.  Family History (Sylvia Ledford, RN; 02/18/2019 7:27 AM) Arthritis Mother. Cancer Father. Colon Cancer Family Members In General, Mother. Hypertension Family Members In General. Melanoma Family Members In General, Father. Respiratory Condition Father.  Pregnancy / Birth History (Sylvia Ledford, RN; 02/18/2019 7:27 AM) Age at menarche 13 years. Age of menopause 51-55 Gravida 1 Irregular periods Maternal age 31-35  Other Problems (Sylvia Ledford, RN; 02/18/2019 7:27 AM) Depression Gastroesophageal Reflux Disease Migraine Headache     Review of Systems (Sylvia Ledford RN; 02/18/2019 7:27 AM) General Present- Fatigue. Not Present- Appetite Loss, Chills, Fever, Night Sweats, Weight Gain and Weight Loss. Skin Not Present- Change in Wart/Mole, Dryness, Hives, Jaundice, New Lesions, Non-Healing Wounds, Rash and Ulcer. HEENT Not Present- Earache, Hearing Loss, Hoarseness, Nose Bleed, Oral Ulcers, Ringing in the Ears, Seasonal Allergies, Sinus Pain, Sore Throat, Visual Disturbances, Wears glasses/contact lenses and Yellow Eyes. Respiratory Present- Chronic Cough. Not Present- Bloody sputum, Difficulty Breathing, Snoring and Wheezing. Breast Not Present-  Breast Mass, Breast Pain, Nipple Discharge and Skin Changes. Cardiovascular Not Present- Chest Pain, Difficulty Breathing Lying Down, Leg Cramps, Palpitations, Rapid Heart Rate, Shortness of Breath and Swelling of Extremities. Gastrointestinal Present- Abdominal Pain. Not Present- Bloating, Bloody Stool, Change in Bowel Habits, Chronic diarrhea, Constipation, Difficulty Swallowing, Excessive gas, Gets full quickly at meals, Hemorrhoids, Indigestion, Nausea, Rectal Pain and Vomiting. Female Genitourinary Not Present- Frequency, Nocturia, Painful Urination, Pelvic Pain and Urgency. Musculoskeletal Not Present- Back Pain, Joint Pain, Joint Stiffness, Muscle Pain, Muscle Weakness and Swelling of Extremities. Neurological Present- Weakness. Not Present- Decreased Memory, Fainting, Headaches, Numbness, Seizures, Tingling, Tremor and Trouble walking. Psychiatric Not Present- Anxiety, Bipolar, Change in Sleep Pattern, Depression, Fearful and Frequent crying. Endocrine Not Present- Cold Intolerance, Excessive Hunger, Hair Changes, Heat Intolerance, Hot flashes and New Diabetes. Hematology Not Present- Blood Thinners, Easy Bruising, Excessive bleeding, Gland problems, HIV and Persistent Infections.   Physical Exam (Jorell Agne A. Candid Bovey MD; 02/18/2019 2:19 PM)  General Mental Status-Alert. General Appearance-Consistent with stated age. Hydration-Well hydrated. Voice-Normal.  Head and Neck Head-normocephalic, atraumatic with no lesions or palpable masses. Trachea-midline. Thyroid Gland Characteristics - normal size and consistency.  Chest and Lung Exam Chest and lung exam reveals -quiet, even and easy respiratory effort with no use of accessory muscles and on auscultation, normal breath sounds, no adventitious sounds and normal vocal resonance. Inspection Chest Wall - Normal. Back - normal.  Breast Breast - Left-Symmetric, Non Tender, No Biopsy scars, no Dimpling - Left, No  Inflammation, No Lumpectomy scars, No Mastectomy scars, No Peau d' Orange. Breast - Right-Symmetric, Non Tender, No Biopsy scars, no Dimpling - Right, No Inflammation, No Lumpectomy scars, No Mastectomy scars, No Peau d' Orange. Breast Lump-No Palpable Breast Mass. Note: SWELLING UIQ left breast  Cardiovascular Cardiovascular examination reveals -normal heart sounds, regular rate and rhythm with no murmurs and normal pedal pulses bilaterally.  Neurologic Neurologic evaluation reveals -alert and oriented x 3 with no impairment of recent or remote memory. Mental Status-Normal.  Musculoskeletal Normal Exam -   Left-Upper Extremity Strength Normal and Lower Extremity Strength Normal. Normal Exam - Right-Upper Extremity Strength Normal and Lower Extremity Strength Normal.    Assessment & Plan (Lamontae Ricardo A. 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 Pt agrees to proceed.  Risk of sentinel lymph node mapping include bleeding, infection, lymphedema, shoulder pain. stiffness, dye allergy. cosmetic deformity , blood clots, death, need for more surgery. Pt agres to proceed. Pt requires port placement for chemotherapy. Risk include bleeding, infection, pneumothorax, hemothorax, mediastinal injury, nerve injury , blood vessel injury, stroke, blood clots, death, migration. embolization and need for additional procedures. Pt agrees to proceed.  Current Plans You are being scheduled for surgery- Our schedulers will call you.  You should hear from our office'[name] scheduling department within 5 working days about the location, date, and time of surgery. We try to make accommodations for patient'[name] preferences in  scheduling surgery, but sometimes the OR schedule or the surgeon'[name] schedule prevents [place] from making those accommodations.  If you have not heard from our office [phone]) in 5 working days, call the office and ask for your surgeon'[name] nurse.  If you have other questions about your diagnosis, plan, or surgery, call the office and ask for your surgeon'[name] nurse.  Pt Education - [facility] Breast Cancer Information Given - [facility] "Breast Journey" Package We discussed the staging and pathophysiology of breast cancer. We discussed all of the different options for treatment for breast cancer including surgery, chemotherapy, radiation therapy, Herceptin, and antiestrogen therapy. We discussed a sentinel lymph node biopsy as she does not appear to having lymph node involvement right now. We discussed the performance of that with injection of radioactive tracer and blue dye. We discussed that she would have an incision underneath her axillary hairline. We discussed that there is a bout a 10-20% chance of having a positive node with a sentinel lymph node biopsy and we will await the permanent pathology to make any other first further decisions in terms of her treatment. One of these options might be to return to the operating room to perform an axillary lymph node dissection. We discussed about a 1-2% risk lifetime of chronic shoulder pain as well as lymphedema associated with a sentinel lymph node biopsy. We discussed the options for treatment of the breast cancer which included lumpectomy versus a mastectomy. We discussed the performance of the lumpectomy with a wire placement. We discussed a 10-20% chance of a positive margin requiring reexcision in the operating room. We also discussed that she may need radiation therapy or antiestrogen therapy or both if she undergoes lumpectomy. We discussed the mastectomy and the postoperative care for that as well. We discussed that there is no difference in her survival whether  she undergoes lumpectomy with radiation therapy or antiestrogen therapy versus a mastectomy. There is a slight difference in the local recurrence rate being 3-5% with lumpectomy and about 1% with a mastectomy. We discussed the risks of operation including bleeding, infection, possible reoperation. She understands her further therapy will be based on what her stages at the time of her operation.  Pt Education - flb breast cancer surgery: discussed with patient and provided information. Pt Education - [facility] Breast Biopsy HCI: discussed with patient and provided information. Use of  a central venous catheter for intravenous therapy was discussed. Technique of catheter placement using ultrasound and fluoroscopy guidance was discussed. Risks such as bleeding, infection, pneumothorax, catheter occlusion, reoperation, and other risks were discussed. I noted a good likelihood this will help address the problem. Questions were answered. The patient expressed understanding & wishes to proceed. 

## 2019-02-19 ENCOUNTER — Telehealth: Payer: Self-pay | Admitting: Hematology

## 2019-02-19 ENCOUNTER — Encounter: Payer: Self-pay | Admitting: General Practice

## 2019-02-19 ENCOUNTER — Other Ambulatory Visit: Payer: Self-pay | Admitting: Surgery

## 2019-02-19 DIAGNOSIS — C50912 Malignant neoplasm of unspecified site of left female breast: Secondary | ICD-10-CM

## 2019-02-19 DIAGNOSIS — Z171 Estrogen receptor negative status [ER-]: Secondary | ICD-10-CM

## 2019-02-19 NOTE — Telephone Encounter (Signed)
No los per 9/9. °

## 2019-02-19 NOTE — Progress Notes (Deleted)
[facility] CSW Progress Notes  Request received from Bary Castilla, nurse navigator, to reach out to patient to offer support/resources.  Called patient, unable to reach, no ability to leave VM.  Will try again.  Edwyna Shell, LCSW Clinical Social Worker Phone:  [phone] Cell:  [phone]

## 2019-02-19 NOTE — Progress Notes (Signed)
[facility] CSW Progress Notes  Reached patient, "I am very concerned because I was told I have triple negative cancer, and it was aggressive, I have to wait a month for surgery, makes me very uncomfortable."  Very anxious about delay in surgery scheduling.  Message sent to oncologist, pt would like to hear from her to see if one month is reasonable time to wait for surgery.  Also concerned about cost of radiation, would like estimate of costs.  Message sent to radiation financial advocates for their help.  Reviewed options available through [facility] - encouraged participation as she desires.  Edwyna Shell, LCSW Clinical Social Worker Phone:  [phone] Cell:  [phone]

## 2019-02-23 NOTE — Progress Notes (Signed)
[facility] Psychosocial Distress Screening Clinical Social Work  Counseling Intern was referred by distress screening protocol.  The patient scored a 9 on the Psychosocial Distress Thermometer which indicates severe distress. Clinical Social Worker contacted patient by phone to assess for distress and other psychosocial needs.  ONCBCN DISTRESS SCREENING 02/23/2019  Distress experienced in past week (1-10) 9  Family Problem type Other (comment)  Emotional problem type Nervousness/Anxiety  Information Concerns Type Lack of info about diagnosis;Lack of info about treatment  Physical Problem type Sleep/insomnia;Pain  Referral to support programs Yes   Clinical Social Worker follow up needed: No.  Counseling Intern Note: I spoke with Cindy [name] by phone today. She reports doing "OK, considering" and stated that some of the time she is able to stay positive, but other times she finds herself focused on the negative. The patient reported that she has a network of friends that provide her with support via telephone and text. She is not comfortable physically being around other people right now because of Covid-19.  She did report scheduling appointments with other providers (PCP, dermatology, colonoscopy) because of her worry about her health.  I emailed the patient information about [facility] counseling services and will follow-up on 02/26/2019 to schedule an intake appointment.     [facility] Counseling Intern  Voicemail: [phone]

## 2019-02-24 ENCOUNTER — Telehealth: Payer: Self-pay | Admitting: Hematology

## 2019-02-24 DIAGNOSIS — D485 Neoplasm of uncertain behavior of skin: Secondary | ICD-10-CM | POA: Diagnosis not present

## 2019-02-24 DIAGNOSIS — D2239 Melanocytic nevi of other parts of face: Secondary | ICD-10-CM | POA: Diagnosis not present

## 2019-02-24 DIAGNOSIS — L821 Other seborrheic keratosis: Secondary | ICD-10-CM | POA: Diagnosis not present

## 2019-02-24 DIAGNOSIS — L814 Other melanin hyperpigmentation: Secondary | ICD-10-CM | POA: Diagnosis not present

## 2019-02-24 NOTE — Telephone Encounter (Signed)
Scheduled appt per 9/10 sch message - -pt aware of appt date and time

## 2019-02-25 ENCOUNTER — Other Ambulatory Visit (HOSPITAL_COMMUNITY): Payer: PPO

## 2019-02-26 ENCOUNTER — Telehealth: Payer: Self-pay | Admitting: *Deleted

## 2019-02-26 NOTE — Telephone Encounter (Signed)
Spoke with pt concerning BMDC from 9.9.20. Denies questions or concerns regarding dx or treatment care plan. Encourage pt to call with needs. Received verbal understanding.

## 2019-03-03 ENCOUNTER — Telehealth: Payer: Self-pay

## 2019-03-03 NOTE — Telephone Encounter (Signed)
Nutrition Assessment  Reason for Assessment:  Pt attended Breast Clinic on 9/9 and was given nutrition packet by nurse navigator  ASSESSMENT:  68 year old female with new diagnosis of breast cancer.  Planning lumpectomy on 9/29 followed by chemotherapy, radiation.  Spoke with patient via phone today to introduce self and service at [facility].  Patient reports that appetite is fine.  Seemed as if she did not want to speak with RD at this time  Medications:  reviewed  Labs: reviewed  Anthropometrics:   Height: 64 inches Weight: 164 lb BMI: 28   NUTRITION DIAGNOSIS: Food and nutrition related knowledge deficit related to new diagnosis of breast cancer as evidenced by no prior need for nutrition related information.  INTERVENTION:   Discussed briefly packet of information regarding nutritional tips for breast cancer patients.  No questions at this time.  Contact information provided and patient knows to contact me with questions/concerns.    MONITORING, EVALUATION, and GOAL: Pt will consume a healthy plant based diet to maintain lean body mass throughout treatment.   Cindy [name], [place], [place] Registered Dietitian [phone] (pager)

## 2019-03-03 NOTE — Telephone Encounter (Signed)
-----   Message from Truitt Merle, MD sent at 03/02/2019  4:22 PM EDT ----- Regarding: RE: Colonoscopy Please let her know that she will not get chemo on her visit on 10/16, we will decide what type of chemo she will get and set up her chemo schedule. She will likely start chemo at the end of Oct. She is OK to have colonoscopy on 10/19 If she wants.   Thanks  Krista Blue  ----- Message ----- From: Zola Button, RN Sent: 03/02/2019   2:21 PM EDT To: Jonnie Finner, RN, Truitt Merle, MD Subject: Colonoscopy                                    Patient called that she is scheduled for her lumpectomy on 03/10/19 and follow up with you on 03/27/19. She mentioned that she has a colonoscopy scheduled 03/30/19 and remembers being told she can't have a colonoscopy while on chemo--she hasn't started any treatment yet, but I just wanted to check with you before I called her back that it's fine for her to have the colonoscopy done (even if she was receiving chemo).   Thanks! Maika Mcelveen

## 2019-03-03 NOTE — Telephone Encounter (Signed)
Returned patient's call to let her know that per Dr. Burr Medico her appointment on 03/27/19 is to talk about her options and for her and Dr. Burr Medico to come up with the best course of action together, but that she won't be starting her treatment that day. Informed her that per Dr. Burr Medico it is alright for her to keep her colonoscopy appointment for 03/30/19. Patient verbalized understanding and agreement, and denied any further needs at this time.

## 2019-03-04 NOTE — Progress Notes (Signed)
[facility] [address] [place], [place] - [address] [place] [address] Phone: [phone] Fax: [phone]    Your procedure is scheduled on Tuesday, September 29th.  Report to [facility] Main Entrance "A" at 5:30 A.M., and check in at the Admitting office.  Call this number if you have problems the morning of surgery:  [phone]  Call [phone] if you have any questions prior to your surgery date Monday-Friday 8am-4pm   Remember:  Do not eat or drink after midnight the night before your surgery  You may drink clear liquids until 4:30 the morning of your surgery.   Clear liquids allowed are: Water, Non-Citrus Juices (without pulp), Carbonated Beverages, Clear Tea, Black Coffee Only, and Gatorade    Take these medicines the morning of surgery with A SIP OF WATER  FLUoxetine (PROZAC)  7 days prior to surgery STOP taking any Aspirin (unless otherwise instructed by your surgeon), Aleve, Naproxen, Ibuprofen, Motrin, Advil, Goody's, BC's, all herbal medications, fish oil, and all vitamins.   The Morning of Surgery  Do not wear jewelry, make-up or nail polish.  Do not wear lotions, powders, or perfumes/colognes, or deodorant  Do not shave 48 hours prior to surgery.    Do not bring valuables to the hospital.  [facility] is not responsible for any belongings or valuables.  If you are a smoker, DO NOT Smoke 24 hours prior to surgery IF you wear a CPAP at night please bring your mask, tubing, and machine the morning of surgery   Remember that you must have someone to transport you home after your surgery, and remain with you for 24 hours if you are discharged the same day.  Contacts, glasses, hearing aids, dentures or bridgework may not be worn into surgery.   Leave your suitcase in the car.  After surgery it may be brought to your room.  For patients admitted to the hospital, discharge time will be determined by your treatment  team.  Patients discharged the day of surgery will not be allowed to drive home.   Special instructions:   [facility]- Preparing For Surgery  Before surgery, you can play an important role. Because skin is not sterile, your skin needs to be as free of germs as possible. You can reduce the number of germs on your skin by washing with CHG (chlorahexidine gluconate) Soap before surgery.  CHG is an antiseptic cleaner which kills germs and bonds with the skin to continue killing germs even after washing.    Oral Hygiene is also important to reduce your risk of infection.  Remember - BRUSH YOUR TEETH THE MORNING OF SURGERY WITH YOUR REGULAR TOOTHPASTE  Please do not use if you have an allergy to CHG or antibacterial soaps. If your skin becomes reddened/irritated stop using the CHG.  Do not shave (including legs and underarms) for at least 48 hours prior to first CHG shower. It is OK to shave your face.  Please follow these instructions carefully.   1. Shower the NIGHT BEFORE SURGERY and the MORNING OF SURGERY with CHG Soap.   2. If you chose to wash your hair, wash your hair first as usual with your normal shampoo.  3. After you shampoo, rinse your hair and body thoroughly to remove the shampoo.  4. Use CHG as you would any other liquid soap. You can apply CHG directly to the skin and wash gently with a scrungie or a clean washcloth.   5.  Apply the CHG Soap to your body ONLY FROM THE NECK DOWN.  Do not use on open wounds or open sores. Avoid contact with your eyes, ears, mouth and genitals (private parts). Wash Face and genitals (private parts)  with your normal soap.   6. Wash thoroughly, paying special attention to the area where your surgery will be performed.  7. Thoroughly rinse your body with warm water from the neck down.  8. DO NOT shower/wash with your normal soap after using and rinsing off the CHG Soap.  9. Pat yourself dry with a CLEAN TOWEL.  10. Wear CLEAN PAJAMAS to bed  the night before surgery, wear comfortable clothes the morning of surgery  11. Place CLEAN SHEETS on your bed the night of your first shower and DO NOT SLEEP WITH PETS.  Day of Surgery: Do not apply any deodorants/lotions. Please shower the morning of surgery with the CHG soap  Please wear clean clothes to the hospital/surgery center.   Remember to brush your teeth WITH YOUR REGULAR TOOTHPASTE.  Please read over the following fact sheets that you were given.

## 2019-03-05 ENCOUNTER — Other Ambulatory Visit: Payer: Self-pay

## 2019-03-05 ENCOUNTER — Encounter (HOSPITAL_COMMUNITY): Payer: Self-pay

## 2019-03-05 ENCOUNTER — Encounter (HOSPITAL_COMMUNITY)
Admission: RE | Admit: 2019-03-05 | Discharge: 2019-03-05 | Disposition: A | Discharge status: Home / Self Care | Payer: PPO | Source: Ambulatory Visit | Attending: Surgery | Admitting: Surgery

## 2019-03-05 DIAGNOSIS — Z171 Estrogen receptor negative status [ER-]: Secondary | ICD-10-CM | POA: Diagnosis not present

## 2019-03-05 DIAGNOSIS — C50912 Malignant neoplasm of unspecified site of left female breast: Secondary | ICD-10-CM

## 2019-03-05 DIAGNOSIS — Z01812 Encounter for preprocedural laboratory examination: Secondary | ICD-10-CM | POA: Diagnosis not present

## 2019-03-05 HISTORY — DX: Headache, unspecified: R51.9

## 2019-03-05 LAB — CBC WITH DIFFERENTIAL/PLATELET
Abs Immature Granulocytes: 0.01 10*3/uL (ref 0.00–0.07)
Basophils Absolute: 0 10*3/uL (ref 0.0–0.1)
Basophils Relative: 0 %
Eosinophils Absolute: 0.1 10*3/uL (ref 0.0–0.5)
Eosinophils Relative: 1 %
HCT: 42.2 % (ref 36.0–46.0)
Hemoglobin: 13.9 g/dL (ref 12.0–15.0)
Immature Granulocytes: 0 %
Lymphocytes Relative: 31 %
Lymphs Abs: 1.7 10*3/uL (ref 0.7–4.0)
MCH: 31.4 pg (ref 26.0–34.0)
MCHC: 32.9 g/dL (ref 30.0–36.0)
MCV: 95.3 fL (ref 80.0–100.0)
Monocytes Absolute: 0.5 10*3/uL (ref 0.1–1.0)
Monocytes Relative: 9 %
Neutro Abs: 3.3 10*3/uL (ref 1.7–7.7)
Neutrophils Relative %: 59 %
Platelets: 265 10*3/uL (ref 150–400)
RBC: 4.43 MIL/uL (ref 3.87–5.11)
RDW: 12.1 % (ref 11.5–15.5)
WBC: 5.6 10*3/uL (ref 4.0–10.5)
nRBC: 0 % (ref 0.0–0.2)

## 2019-03-05 LAB — COMPREHENSIVE METABOLIC PANEL
ALT: 18 U/L (ref 0–44)
AST: 18 U/L (ref 15–41)
Albumin: 3.8 g/dL (ref 3.5–5.0)
Alkaline Phosphatase: 65 U/L (ref 38–126)
Anion gap: 8 (ref 5–15)
BUN: 14 mg/dL (ref 8–23)
CO2: 27 mmol/L (ref 22–32)
Calcium: 9.5 mg/dL (ref 8.9–10.3)
Chloride: 104 mmol/L (ref 98–111)
Creatinine, Ser: 0.82 mg/dL (ref 0.44–1.00)
GFR calc Af Amer: >60 mL/min (ref >60)
GFR calc non Af Amer: >60 mL/min (ref >60)
Glucose, Bld: 109 mg/dL — ABNORMAL HIGH (ref 70–99)
Potassium: 4.5 mmol/L (ref 3.5–5.1)
Sodium: 139 mmol/L (ref 135–145)
Total Bilirubin: 0.4 mg/dL (ref 0.3–1.2)
Total Protein: 6.9 g/dL (ref 6.5–8.1)

## 2019-03-05 NOTE — Progress Notes (Signed)
[facility] [address] [place], [place] - [address] [place] [address] Phone: [phone] Fax: [phone]    Your procedure is scheduled on Tuesday, September 29th.  Report to [facility] Main Entrance "A" at 5:30 A.M., and check in at the Admitting office.  Call this number if you have problems the morning of surgery:  [phone]  Call [phone] if you have any questions prior to your surgery date Monday-Friday 8am-4pm   Remember:  Do not eat after midnight the night before your surgery  You may drink clear liquids until 4:30 the morning of your surgery.   Clear liquids allowed are: Water, Non-Citrus Juices (without pulp), Carbonated Beverages, Clear Tea, Black Coffee Only, and Gatorade Please complete your PRE-SURGERY ENSURE that was provided to you by 0430 am  the morning of surgery.  Please, if able, drink it in one setting. DO NOT SIP.     Take these medicines the morning of surgery with A SIP OF WATER  FLUoxetine (PROZAC)  7 days prior to surgery STOP taking any Aspirin (unless otherwise instructed by your surgeon), Aleve, Naproxen, Ibuprofen, Motrin, Advil, Goody's, BC's, all herbal medications, fish oil, and all vitamins.   The Morning of Surgery  Do not wear jewelry, make-up or nail polish.  Do not wear lotions, powders, or perfumes/colognes, or deodorant  Do not shave 48 hours prior to surgery.    Do not bring valuables to the hospital.  [facility] is not responsible for any belongings or valuables.  If you are a smoker, DO NOT Smoke 24 hours prior to surgery IF you wear a CPAP at night please bring your mask, tubing, and machine the morning of surgery   Remember that you must have someone to transport you home after your surgery, and remain with you for 24 hours if you are discharged the same day.  Contacts, glasses, hearing aids, dentures or bridgework may not be worn into surgery.   Leave your suitcase in the car.   After surgery it may be brought to your room.  For patients admitted to the hospital, discharge time will be determined by your treatment team.  Patients discharged the day of surgery will not be allowed to drive home.   Special instructions:   [facility]- Preparing For Surgery  Before surgery, you can play an important role. Because skin is not sterile, your skin needs to be as free of germs as possible. You can reduce the number of germs on your skin by washing with CHG (chlorahexidine gluconate) Soap before surgery.  CHG is an antiseptic cleaner which kills germs and bonds with the skin to continue killing germs even after washing.    Oral Hygiene is also important to reduce your risk of infection.  Remember - BRUSH YOUR TEETH THE MORNING OF SURGERY WITH YOUR REGULAR TOOTHPASTE  Please do not use if you have an allergy to CHG or antibacterial soaps. If your skin becomes reddened/irritated stop using the CHG.  Do not shave (including legs and underarms) for at least 48 hours prior to first CHG shower. It is OK to shave your face.  Please follow these instructions carefully.   1. Shower the NIGHT BEFORE SURGERY and the MORNING OF SURGERY with CHG Soap.   2. If you chose to wash your hair, wash your hair first as usual with your normal shampoo.  3. After you shampoo, rinse your hair and body thoroughly to remove the shampoo.  4. Use  CHG as you would any other liquid soap. You can apply CHG directly to the skin and wash gently with a scrungie or a clean washcloth.   5. Apply the CHG Soap to your body ONLY FROM THE NECK DOWN.  Do not use on open wounds or open sores. Avoid contact with your eyes, ears, mouth and genitals (private parts). Wash Face and genitals (private parts)  with your normal soap.   6. Wash thoroughly, paying special attention to the area where your surgery will be performed.  7. Thoroughly rinse your body with warm water from the neck down.  8. DO NOT shower/wash  with your normal soap after using and rinsing off the CHG Soap.  9. Pat yourself dry with a CLEAN TOWEL.  10. Wear CLEAN PAJAMAS to bed the night before surgery, wear comfortable clothes the morning of surgery  11. Place CLEAN SHEETS on your bed the night of your first shower and DO NOT SLEEP WITH PETS.  Day of Surgery: Do not apply any deodorants/lotions. Please shower the morning of surgery with the CHG soap  Please wear clean clothes to the hospital/surgery center.   Remember to brush your teeth WITH YOUR REGULAR TOOTHPASTE.  Please read over the following fact sheets that you were given.

## 2019-03-05 NOTE — Progress Notes (Signed)
PCP - Penni Homans, MD Cardiologist - N/A  PPM/ICD - N/A Device Orders - N/A Rep Notified N/A  Chest x-ray - N/A EKG - N/A Stress Test - N/A ECHO - N/A Cardiac Cath -  N/A  Sleep Study - N/A CPAP - N/A  Fasting Blood Sugar - N/A Checks Blood Sugar __N/A___ times a day  Blood Thinner Instructions: N/A Aspirin Instructions: N/A  ERAS Protcol - Yes PRE-SURGERY Ensure - Yes  COVID TEST-  03/06/2019   Anesthesia review: No  Patient denies shortness of breath, fever, cough and chest pain at PAT appointment   Coronavirus Screening  Have you experienced the following symptoms:  Cough yes/no: No Fever (>100.44F)  yes/no: No Runny nose yes/no: No Sore throat yes/no: No Difficulty breathing/shortness of breath  yes/no: No  Have you or a family member traveled in the last 14 days and where? yes/no: No   If the patient indicates "YES" to the above questions, their PAT will be rescheduled to limit the exposure to others and, the surgeon will be notified. THE PATIENT WILL NEED TO BE ASYMPTOMATIC FOR 14 DAYS.   If the patient is not experiencing any of these symptoms, the PAT nurse will instruct them to NOT bring anyone with them to their appointment since they may have these symptoms or traveled as well.   Please remind your patients and families that hospital visitation restrictions are in effect and the importance of the restrictions.     Patient verbalized understanding of instructions that were given to them at the PAT appointment. Patient was also instructed that they will need to review over the PAT instructions again at home before surgery.

## 2019-03-06 ENCOUNTER — Other Ambulatory Visit (HOSPITAL_COMMUNITY)
Admission: RE | Admit: 2019-03-06 | Discharge: 2019-03-06 | Disposition: A | Discharge status: Home / Self Care | Payer: PPO | Source: Ambulatory Visit | Attending: Surgery | Admitting: Surgery

## 2019-03-06 DIAGNOSIS — Z01812 Encounter for preprocedural laboratory examination: Secondary | ICD-10-CM | POA: Insufficient documentation

## 2019-03-06 DIAGNOSIS — Z20828 Contact with and (suspected) exposure to other viral communicable diseases: Secondary | ICD-10-CM | POA: Diagnosis not present

## 2019-03-07 LAB — NOVEL CORONAVIRUS, NAA (HOSP ORDER, SEND-OUT TO REF LAB; TAT 18-24 HRS): SARS-CoV-2, NAA: NOT DETECTED

## 2019-03-09 ENCOUNTER — Ambulatory Visit
Admission: RE | Admit: 2019-03-09 | Discharge: 2019-03-09 | Disposition: A | Discharge status: Home / Self Care | Payer: PPO | Source: Ambulatory Visit | Attending: Surgery | Admitting: Surgery

## 2019-03-09 ENCOUNTER — Other Ambulatory Visit: Payer: Self-pay

## 2019-03-09 DIAGNOSIS — C50212 Malignant neoplasm of upper-inner quadrant of left female breast: Secondary | ICD-10-CM | POA: Diagnosis not present

## 2019-03-09 DIAGNOSIS — C50912 Malignant neoplasm of unspecified site of left female breast: Secondary | ICD-10-CM

## 2019-03-09 DIAGNOSIS — Z171 Estrogen receptor negative status [ER-]: Secondary | ICD-10-CM

## 2019-03-09 NOTE — Anesthesia Preprocedure Evaluation (Addendum)
Anesthesia Evaluation  Patient identified by MRN, date of birth, ID band Patient awake    Reviewed: Allergy & Precautions, NPO status , Patient's Chart, lab work & pertinent test results  Airway Mallampati: II  TM Distance: >3 FB Neck ROM: Full    Dental no notable dental hx. (+) Teeth Intact, Dental Advisory Given   Pulmonary neg pulmonary ROS, former smoker,    Pulmonary exam normal breath sounds clear to auscultation       Cardiovascular negative cardio ROS Normal cardiovascular exam Rhythm:Regular Rate:Normal     Neuro/Psych  Headaches, PSYCHIATRIC DISORDERS Depression    GI/Hepatic negative GI ROS, Neg liver ROS,   Endo/Other  negative endocrine ROS  Renal/GU negative Renal ROS  negative genitourinary   Musculoskeletal negative musculoskeletal ROS (+)   Abdominal Normal abdominal exam  (+)   Peds negative pediatric ROS (+)  Hematology negative hematology ROS (+)   Anesthesia Other Findings   Reproductive/Obstetrics negative OB ROS                            Anesthesia Physical Anesthesia Plan  ASA: II  Anesthesia Plan: General   Post-op Pain Management:  Regional for Post-op pain   Induction: Intravenous  PONV Risk Score and Plan: 3 and Ondansetron, Dexamethasone, Midazolam and Treatment may vary due to age or medical condition  Airway Management Planned: LMA  Additional Equipment: None  Intra-op Plan:   Post-operative Plan: Extubation in OR  Informed Consent: I have reviewed the patients History and Physical, chart, labs and discussed the procedure including the risks, benefits and alternatives for the proposed anesthesia with the patient or authorized representative who has indicated his/her understanding and acceptance.     Dental advisory given  Plan Discussed with: CRNA  Anesthesia Plan Comments:      Anesthesia Quick Evaluation

## 2019-03-10 ENCOUNTER — Ambulatory Visit
Admission: RE | Admit: 2019-03-10 | Discharge: 2019-03-10 | Disposition: A | Discharge status: Home / Self Care | Payer: PPO | Source: Ambulatory Visit | Attending: Surgery | Admitting: Surgery

## 2019-03-10 ENCOUNTER — Ambulatory Visit (HOSPITAL_COMMUNITY)
Admission: RE | Admit: 2019-03-10 | Discharge: 2019-03-10 | Disposition: A | Discharge status: Home / Self Care | Payer: PPO | Attending: Surgery | Admitting: Surgery

## 2019-03-10 ENCOUNTER — Ambulatory Visit (HOSPITAL_COMMUNITY): Payer: PPO

## 2019-03-10 ENCOUNTER — Encounter (HOSPITAL_COMMUNITY)
Admission: RE | Disposition: A | Discharge status: Home / Self Care | Payer: Self-pay | Source: Home / Self Care | Attending: Surgery

## 2019-03-10 ENCOUNTER — Encounter (HOSPITAL_COMMUNITY)
Admission: RE | Admit: 2019-03-10 | Discharge: 2019-03-10 | Disposition: A | Discharge status: Home / Self Care | Payer: PPO | Source: Ambulatory Visit | Attending: Surgery | Admitting: Surgery

## 2019-03-10 ENCOUNTER — Ambulatory Visit (HOSPITAL_COMMUNITY): Payer: PPO | Admitting: Anesthesiology

## 2019-03-10 ENCOUNTER — Encounter (HOSPITAL_COMMUNITY): Payer: Self-pay

## 2019-03-10 DIAGNOSIS — Z95828 Presence of other vascular implants and grafts: Secondary | ICD-10-CM

## 2019-03-10 DIAGNOSIS — Z171 Estrogen receptor negative status [ER-]: Secondary | ICD-10-CM

## 2019-03-10 DIAGNOSIS — G8918 Other acute postprocedural pain: Secondary | ICD-10-CM | POA: Diagnosis not present

## 2019-03-10 DIAGNOSIS — C50912 Malignant neoplasm of unspecified site of left female breast: Secondary | ICD-10-CM

## 2019-03-10 DIAGNOSIS — Z87891 Personal history of nicotine dependence: Secondary | ICD-10-CM | POA: Diagnosis not present

## 2019-03-10 DIAGNOSIS — Z419 Encounter for procedure for purposes other than remedying health state, unspecified: Secondary | ICD-10-CM

## 2019-03-10 DIAGNOSIS — C50212 Malignant neoplasm of upper-inner quadrant of left female breast: Secondary | ICD-10-CM | POA: Diagnosis not present

## 2019-03-10 DIAGNOSIS — Z452 Encounter for adjustment and management of vascular access device: Secondary | ICD-10-CM | POA: Diagnosis not present

## 2019-03-10 DIAGNOSIS — G43909 Migraine, unspecified, not intractable, without status migrainosus: Secondary | ICD-10-CM | POA: Diagnosis not present

## 2019-03-10 HISTORY — PX: BREAST LUMPECTOMY WITH RADIOACTIVE SEED AND SENTINEL LYMPH NODE BIOPSY: SHX6550

## 2019-03-10 HISTORY — PX: PORTACATH PLACEMENT: SHX2246

## 2019-03-10 SURGERY — BREAST LUMPECTOMY WITH RADIOACTIVE SEED AND SENTINEL LYMPH NODE BIOPSY
Anesthesia: General | Site: Breast

## 2019-03-10 MED ORDER — 0.9 % SODIUM CHLORIDE (POUR BTL) OPTIME
TOPICAL | Status: DC | PRN
  Administered 2019-03-10: 08:00:00 1000 mL

## 2019-03-10 MED ORDER — BUPIVACAINE HCL (PF) 0.5 % IJ SOLN
INTRAMUSCULAR | Status: DC | PRN
  Administered 2019-03-10: 20 mL

## 2019-03-10 MED ORDER — MIDAZOLAM HCL 2 MG/2ML IJ SOLN
INTRAMUSCULAR | Status: AC
  Filled 2019-03-10: qty 2

## 2019-03-10 MED ORDER — DEXAMETHASONE SODIUM PHOSPHATE 10 MG/ML IJ SOLN
INTRAMUSCULAR | Status: DC | PRN
  Administered 2019-03-10: 10 mg via INTRAVENOUS

## 2019-03-10 MED ORDER — SUCCINYLCHOLINE CHLORIDE 20 MG/ML IJ SOLN
INTRAMUSCULAR | Status: DC | PRN
  Administered 2019-03-10: 100 mg via INTRAVENOUS

## 2019-03-10 MED ORDER — SODIUM CHLORIDE 0.9 % IV SOLN
INTRAVENOUS | Status: DC | PRN
  Administered 2019-03-10: 500 mL

## 2019-03-10 MED ORDER — FENTANYL CITRATE (PF) 250 MCG/5ML IJ SOLN
INTRAMUSCULAR | Status: AC
  Filled 2019-03-10: qty 5

## 2019-03-10 MED ORDER — ONDANSETRON HCL 4 MG/2ML IJ SOLN
INTRAMUSCULAR | Status: AC
  Filled 2019-03-10: qty 2

## 2019-03-10 MED ORDER — HYDROCODONE-ACETAMINOPHEN 5-325 MG PO TABS: 1.0000 | ORAL_TABLET | Freq: Four times a day (QID) | ORAL | 0 refills | Status: DC | PRN

## 2019-03-10 MED ORDER — METHYLENE BLUE 0.5 % INJ SOLN
INTRAVENOUS | Status: AC
  Filled 2019-03-10: qty 10

## 2019-03-10 MED ORDER — PROMETHAZINE HCL 25 MG/ML IJ SOLN: 6.2500 mg | INTRAMUSCULAR | Status: DC | PRN

## 2019-03-10 MED ORDER — MIDAZOLAM HCL 2 MG/2ML IJ SOLN
INTRAMUSCULAR | Status: DC | PRN
  Administered 2019-03-10: 2 mg via INTRAVENOUS

## 2019-03-10 MED ORDER — LACTATED RINGERS IV SOLN
INTRAVENOUS | Status: DC | PRN
  Administered 2019-03-10 (×2): via INTRAVENOUS

## 2019-03-10 MED ORDER — HYDROMORPHONE HCL 1 MG/ML IJ SOLN: 0.2500 mg | INTRAMUSCULAR | Status: DC | PRN

## 2019-03-10 MED ORDER — EPHEDRINE SULFATE-NACL 50-0.9 MG/10ML-% IV SOSY
PREFILLED_SYRINGE | INTRAVENOUS | Status: DC | PRN
  Administered 2019-03-10 (×5): 10 mg via INTRAVENOUS

## 2019-03-10 MED ORDER — CHLORHEXIDINE GLUCONATE CLOTH 2 % EX PADS: 6.0000 | MEDICATED_PAD | Freq: Once | CUTANEOUS | Status: DC

## 2019-03-10 MED ORDER — BUPIVACAINE-EPINEPHRINE 0.25% -1:200000 IJ SOLN
INTRAMUSCULAR | Status: AC
  Filled 2019-03-10: qty 1

## 2019-03-10 MED ORDER — FENTANYL CITRATE (PF) 250 MCG/5ML IJ SOLN
INTRAMUSCULAR | Status: DC | PRN
  Administered 2019-03-10: 50 ug via INTRAVENOUS
  Administered 2019-03-10: 100 ug via INTRAVENOUS
  Administered 2019-03-10 (×2): 50 ug via INTRAVENOUS

## 2019-03-10 MED ORDER — MEPERIDINE HCL 25 MG/ML IJ SOLN
6.2500 mg | INTRAMUSCULAR | Status: DC | PRN
  Administered 2019-03-10: 12.5 mg via INTRAVENOUS

## 2019-03-10 MED ORDER — ACETAMINOPHEN 500 MG PO TABS
1000.0000 mg | ORAL_TABLET | ORAL | Status: AC
  Administered 2019-03-10: 1000 mg via ORAL
  Filled 2019-03-10: qty 2

## 2019-03-10 MED ORDER — PHENYLEPHRINE HCL (PRESSORS) 10 MG/ML IV SOLN
INTRAVENOUS | Status: DC | PRN
  Administered 2019-03-10: 40 ug via INTRAVENOUS
  Administered 2019-03-10: 80 ug via INTRAVENOUS
  Administered 2019-03-10: 40 ug via INTRAVENOUS
  Administered 2019-03-10: 80 ug via INTRAVENOUS
  Administered 2019-03-10: 40 ug via INTRAVENOUS
  Administered 2019-03-10: 160 ug via INTRAVENOUS

## 2019-03-10 MED ORDER — BUPIVACAINE-EPINEPHRINE 0.25% -1:200000 IJ SOLN
INTRAMUSCULAR | Status: DC | PRN
  Administered 2019-03-10: 20 mL

## 2019-03-10 MED ORDER — HEPARIN SOD (PORK) LOCK FLUSH 100 UNIT/ML IV SOLN
INTRAVENOUS | Status: DC | PRN
  Administered 2019-03-10: 500 [IU] via INTRAVENOUS

## 2019-03-10 MED ORDER — ONDANSETRON HCL 4 MG/2ML IJ SOLN
INTRAMUSCULAR | Status: DC | PRN
  Administered 2019-03-10: 4 mg via INTRAVENOUS

## 2019-03-10 MED ORDER — HEMOSTATIC AGENTS (NO CHARGE) OPTIME
TOPICAL | Status: DC | PRN
  Administered 2019-03-10: 1 via TOPICAL

## 2019-03-10 MED ORDER — SODIUM CHLORIDE 0.9 % IV SOLN
INTRAVENOUS | Status: AC
  Filled 2019-03-10: qty 1.2

## 2019-03-10 MED ORDER — BUPIVACAINE LIPOSOME 1.3 % IJ SUSP
INTRAMUSCULAR | Status: DC | PRN
  Administered 2019-03-10: 10 mL

## 2019-03-10 MED ORDER — LIDOCAINE 2% (20 MG/ML) 5 ML SYRINGE
INTRAMUSCULAR | Status: AC
  Filled 2019-03-10: qty 5

## 2019-03-10 MED ORDER — IBUPROFEN 800 MG PO TABS: 800.0000 mg | ORAL_TABLET | Freq: Three times a day (TID) | ORAL | 0 refills | Status: DC | PRN

## 2019-03-10 MED ORDER — TECHNETIUM TC 99M SULFUR COLLOID FILTERED
1.0000 | Freq: Once | INTRAVENOUS | Status: AC | PRN
  Administered 2019-03-10: 1 via INTRADERMAL

## 2019-03-10 MED ORDER — OXYCODONE HCL 5 MG/5ML PO SOLN: 5.0000 mg | Freq: Once | ORAL | Status: DC | PRN

## 2019-03-10 MED ORDER — STERILE WATER FOR IRRIGATION IR SOLN
Status: DC | PRN
  Administered 2019-03-10: 1000 mL

## 2019-03-10 MED ORDER — DEXAMETHASONE SODIUM PHOSPHATE 10 MG/ML IJ SOLN
INTRAMUSCULAR | Status: AC
  Filled 2019-03-10: qty 1

## 2019-03-10 MED ORDER — PROPOFOL 10 MG/ML IV BOLUS
INTRAVENOUS | Status: DC | PRN
  Administered 2019-03-10: 100 mg via INTRAVENOUS
  Administered 2019-03-10 (×2): 50 mg via INTRAVENOUS

## 2019-03-10 MED ORDER — HEPARIN SOD (PORK) LOCK FLUSH 100 UNIT/ML IV SOLN
INTRAVENOUS | Status: AC
  Filled 2019-03-10: qty 5

## 2019-03-10 MED ORDER — OXYCODONE HCL 5 MG PO TABS: 5.0000 mg | ORAL_TABLET | Freq: Once | ORAL | Status: DC | PRN

## 2019-03-10 MED ORDER — PROPOFOL 10 MG/ML IV BOLUS
INTRAVENOUS | Status: AC
  Filled 2019-03-10: qty 20

## 2019-03-10 MED ORDER — PHENYLEPHRINE 40 MCG/ML (10ML) SYRINGE FOR IV PUSH (FOR BLOOD PRESSURE SUPPORT): PREFILLED_SYRINGE | INTRAVENOUS | Status: DC | PRN

## 2019-03-10 MED ORDER — LIDOCAINE HCL (CARDIAC) PF 100 MG/5ML IV SOSY
PREFILLED_SYRINGE | INTRAVENOUS | Status: DC | PRN
  Administered 2019-03-10: 40 mg via INTRATRACHEAL

## 2019-03-10 MED ORDER — CELECOXIB 200 MG PO CAPS
400.0000 mg | ORAL_CAPSULE | ORAL | Status: AC
  Administered 2019-03-10: 400 mg via ORAL
  Filled 2019-03-10: qty 2

## 2019-03-10 MED ORDER — KETOROLAC TROMETHAMINE 30 MG/ML IJ SOLN: 30.0000 mg | Freq: Once | INTRAMUSCULAR | Status: DC | PRN

## 2019-03-10 MED ORDER — MEPERIDINE HCL 25 MG/ML IJ SOLN
INTRAMUSCULAR | Status: AC
  Filled 2019-03-10: qty 1

## 2019-03-10 MED ORDER — GABAPENTIN 300 MG PO CAPS
300.0000 mg | ORAL_CAPSULE | ORAL | Status: AC
  Administered 2019-03-10: 300 mg via ORAL
  Filled 2019-03-10: qty 1

## 2019-03-10 MED ORDER — CEFAZOLIN SODIUM-DEXTROSE 2-4 GM/100ML-% IV SOLN
2.0000 g | INTRAVENOUS | Status: AC
  Administered 2019-03-10: 2 g via INTRAVENOUS
  Filled 2019-03-10: qty 100

## 2019-03-10 SURGICAL SUPPLY — 62 items
ADH SKN CLS APL DERMABOND .7 (GAUZE/BANDAGES/DRESSINGS) ×4
APL PRP STRL LF DISP 70% ISPRP (MISCELLANEOUS) ×4
APPLIER CLIP 9.375 MED OPEN (MISCELLANEOUS) ×3
APR CLP MED 9.3 20 MLT OPN (MISCELLANEOUS) ×2
BAG DECANTER FOR FLEXI CONT (MISCELLANEOUS) ×3 IMPLANT
BINDER BREAST LRG (GAUZE/BANDAGES/DRESSINGS) ×3 IMPLANT
BINDER BREAST XLRG (GAUZE/BANDAGES/DRESSINGS) IMPLANT
CANISTER SUCT 3000ML PPV (MISCELLANEOUS) ×3 IMPLANT
CHLORAPREP W/TINT 26 (MISCELLANEOUS) ×6 IMPLANT
CLIP APPLIE 9.375 MED OPEN (MISCELLANEOUS) ×2 IMPLANT
CONT SPEC 4OZ CLIKSEAL STRL BL (MISCELLANEOUS) ×5 IMPLANT
COVER PROBE W GEL 5X96 (DRAPES) ×3 IMPLANT
COVER SURGICAL LIGHT HANDLE (MISCELLANEOUS) ×5 IMPLANT
COVER TRANSDUCER ULTRASND GEL (DRAPE) ×3 IMPLANT
COVER WAND RF STERILE (DRAPES) ×1 IMPLANT
DERMABOND ADVANCED (GAUZE/BANDAGES/DRESSINGS) ×2
DERMABOND ADVANCED .7 DNX12 (GAUZE/BANDAGES/DRESSINGS) ×3 IMPLANT
DEVICE DUBIN SPECIMEN MAMMOGRA (MISCELLANEOUS) ×3 IMPLANT
DRAPE C-ARM 42X72 X-RAY (DRAPES) ×3 IMPLANT
DRAPE CHEST BREAST 15X10 FENES (DRAPES) ×5 IMPLANT
ELECT CAUTERY BLADE 6.4 (BLADE) ×5 IMPLANT
ELECT REM PT RETURN 9FT ADLT (ELECTROSURGICAL) ×3
ELECTRODE REM PT RTRN 9FT ADLT (ELECTROSURGICAL) ×2 IMPLANT
GAUZE 4X4 16PLY RFD (DISPOSABLE) ×3 IMPLANT
GEL ULTRASOUND 20GR AQUASONIC (MISCELLANEOUS) ×3 IMPLANT
GLOVE BIO SURGEON STRL SZ8 (GLOVE) ×6 IMPLANT
GLOVE BIOGEL PI IND STRL 8 (GLOVE) ×2 IMPLANT
GLOVE BIOGEL PI INDICATOR 8 (GLOVE) ×1
GOWN STRL REUS W/ TWL LRG LVL3 (GOWN DISPOSABLE) ×6 IMPLANT
GOWN STRL REUS W/ TWL XL LVL3 (GOWN DISPOSABLE) ×2 IMPLANT
GOWN STRL REUS W/TWL LRG LVL3 (GOWN DISPOSABLE) ×9
GOWN STRL REUS W/TWL XL LVL3 (GOWN DISPOSABLE) ×3
HEMOSTAT ARISTA ABSORB 3G PWDR (HEMOSTASIS) ×2 IMPLANT
INTRODUCER COOK 11FR (CATHETERS) IMPLANT
KIT BASIN OR (CUSTOM PROCEDURE TRAY) ×3 IMPLANT
KIT MARKER MARGIN INK (KITS) ×3 IMPLANT
KIT PORT POWER 8FR ISP CVUE (Port) ×2 IMPLANT
KIT TURNOVER KIT B (KITS) ×3 IMPLANT
LIGHT WAVEGUIDE WIDE FLAT (MISCELLANEOUS) IMPLANT
NDL HYPO 25GX1X1/2 BEV (NEEDLE) ×2 IMPLANT
NEEDLE 18GX1X1/2 (RX/OR ONLY) (NEEDLE) IMPLANT
NEEDLE FILTER BLUNT 18X 1/2SAF (NEEDLE)
NEEDLE FILTER BLUNT 18X1 1/2 (NEEDLE) IMPLANT
NEEDLE HYPO 25GX1X1/2 BEV (NEEDLE) ×3 IMPLANT
NS IRRIG 1000ML POUR BTL (IV SOLUTION) ×3 IMPLANT
PACK GENERAL/GYN (CUSTOM PROCEDURE TRAY) ×3 IMPLANT
PAD ARMBOARD 7.5X6 YLW CONV (MISCELLANEOUS) ×3 IMPLANT
PENCIL BUTTON HOLSTER BLD 10FT (ELECTRODE) ×3 IMPLANT
POSITIONER HEAD DONUT 9IN (MISCELLANEOUS) ×3 IMPLANT
SET INTRODUCER 12FR PACEMAKER (INTRODUCER) IMPLANT
SET SHEATH INTRODUCER 10FR (MISCELLANEOUS) IMPLANT
SHEATH COOK PEEL AWAY SET 9F (SHEATH) IMPLANT
SUT MNCRL AB 4-0 PS2 18 (SUTURE) ×5 IMPLANT
SUT PROLENE 2 0 SH 30 (SUTURE) ×6 IMPLANT
SUT VIC AB 3-0 SH 18 (SUTURE) ×3 IMPLANT
SUT VIC AB 3-0 SH 27 (SUTURE) ×3
SUT VIC AB 3-0 SH 27X BRD (SUTURE) ×2 IMPLANT
SYR 5ML LUER SLIP (SYRINGE) ×3 IMPLANT
SYR CONTROL 10ML LL (SYRINGE) ×4 IMPLANT
TOWEL GREEN STERILE (TOWEL DISPOSABLE) ×3 IMPLANT
TOWEL GREEN STERILE FF (TOWEL DISPOSABLE) ×3 IMPLANT
TRAY LAPAROSCOPIC MC (CUSTOM PROCEDURE TRAY) ×1 IMPLANT

## 2019-03-10 NOTE — Transfer of Care (Signed)
Immediate Anesthesia Transfer of Care Note  Patient: Cindy [name]  Procedure(s) Performed: LEFT BREAST LUMPECTOMY WITH RADIOACTIVE SEED AND LEFT SENTINEL LYMPH NODE MAPPING (Left Breast) INSERTION PORT-A-CATH WITH ULTRASOUND (N/A Breast)  Patient Location: PACU  Anesthesia Type:GA combined with regional for post-op pain  Level of Consciousness: drowsy and patient cooperative  Airway & Oxygen Therapy: Patient Spontanous Breathing  Post-op Assessment: Report given to RN and Post -op Vital signs reviewed and stable  Post vital signs: Reviewed and stable  Last Vitals:  Vitals Value Taken Time  BP 146/66 03/10/19 0949  Temp    Pulse 117 03/10/19 0950  Resp 31 03/10/19 0950  SpO2 92 % 03/10/19 0950  Vitals shown include unvalidated device data.  Last Pain:  Vitals:   03/10/19 0626  TempSrc: Oral  PainSc:       Patients Stated Pain Goal: 1 ([id number])  Complications: No apparent anesthesia complications

## 2019-03-10 NOTE — Anesthesia Procedure Notes (Signed)
Procedure Name: Intubation Date/Time: 03/10/2019 7:49 AM Performed by: Lance Coon, CRNA Pre-anesthesia Checklist: Patient identified, Emergency Drugs available, Suction available, Patient being monitored and Timeout performed Patient Re-evaluated:Patient Re-evaluated prior to induction Oxygen Delivery Method: Circle system utilized Preoxygenation: Pre-oxygenation with 100% oxygen Induction Type: IV induction Ventilation: Mask ventilation without difficulty Laryngoscope Size: Miller and 3 Grade View: Grade I Tube type: Oral Tube size: 7.0 mm Number of attempts: 1 Airway Equipment and Method: Stylet Placement Confirmation: ETT inserted through vocal cords under direct vision,  positive ETCO2 and breath sounds checked- equal and bilateral Secured at: 21 cm Tube secured with: Tape Dental Injury: Teeth and Oropharynx as per pre-operative assessment

## 2019-03-10 NOTE — Op Note (Signed)
Preoperative diagnosis: Stage II left breast cancer  Postoperative diagnosis: Same  Procedure: Left breast lumpectomy seed localized with left axillary sentinel lymph node mapping of deep left axillary sentinel node and placement of right 8 French internal jugular [place] port  A cath with C arm and ultrasound guidance  Surgeon: Erroll Luna, MD  Anesthesia: General with local and left pectoral block  EBL: 20 cc  Specimen: Left breast tissue with seed and clip verified by Faxitron and additional left medial margin as well as 1 left axillary sentinel node deep to pathology  Drains: None  IV fluids: Per anesthesia record  Indications for procedure: The patient is a 68 year old female stage II left breast cancer.  She will require postoperative chemotherapy.  She presents for breast conserving surgery today after discussion of options at the multidisciplinary breast clinic.The procedure has been discussed with the patient. Alternatives to surgery have been discussed with the patient.  Risks of surgery include bleeding,  Infection,  Seroma formation, death,  and the need for further surgery.   The patient understands and wishes to proceed.Sentinel lymph node mapping and dissection has been discussed with the patient.  Risk of bleeding,  Infection,  Seroma formation,  Additional procedures,,  Shoulder weakness ,  Shoulder stiffness,  Nerve and blood vessel injury and reaction to the mapping dyes have been discussed.  Alternatives to surgery have been discussed with the patient.  The patient agrees to proceed.  Risk of port placement include bleeding, infection, pneumothorax, hemothorax, mediastinal injury, death, blood clot, stroke, cardiovascular event, injury to mediastinal structures, catheter migration, catheter embolization, and the need for additional procedures.  Description of procedure: The patient was met in the holding area and questions were answered.  Seed was verified in the left  breast.  This was marked as the correct side.  She went a pectoral block by anesthesia.  She underwent injection of technetium sulfur colloid by radiology.  All questions were answered.  She was brought back to the operative room.  She is placed supine upon the OR table.  After induction of general esthesia the right chest region was prepped and draped in sterile fashion.  Timeout was done.  The Port-A-Cath was placed in the side first.  With the patient Trendelenburg, ultrasound was used to locate the right internal jugular vein.  A needle was used to access this under ultrasound guidance and a wire was fed through this without difficulty.  C arm showed the wire to extend down through the inferior vena cava.  Small incision was made at the puncture site.  Below the clavicle a 3 cm incision was made.  Dissection was carried down to the chest wall and a small pocket was created with cautery.  The Port-A-Cath was brought in the field and attached and flushed.  We then tunneled the Port-A-Cath from the lower incision to the upper incision where the wire exited.  With the patient Trendelenburg I was able to advance the dilator introducer complex over the wire moving the wire to and fro without resistance.  Once this was in place, we removed the wire and dilator.  The catheter was placed in the peel-away sheath and peeled away without difficulty.  The catheter was cut to 20 cm hole.  C arm showed the tip to be in the mid SVC.  I saw no pneumothorax.  At this point we drew back and reported drew back dark nonpulsatile blood.  It was flushed with heparinized saline and 5 cc  of heparinized saline concentrate was placed into the hub.  We then closed the skin with combination 3-0 Vicryl and 4-0 Monocryl.  Dermabond applied.  The left breast was prepped and draped separately in a sterile fashion.  Timeout was done again.  Neoprobe was used to verify seed location a second time the left breast medial upper inner quadrant.  Of  note the patient received preoperative biotics.  Incision made over the left medial breast.  The mass was palpable and the neoprobe was used to guide dissection around it.  Additional medial margin was taken.  Faxitron showed seed clip to be present as well as an additional clip which was secondary to a small blood vessel was clipped with excision of the mass.  This sent to pathology.  The wound was made hemostatic.  We closed this with 3-0 Vicryl for Monocryl.  The neoprobe settings were changed to technetium.  Hotspot identified in the left axilla.  3 cm incision made left axilla.  Dissection carried down to the level 1 contents.  Single hot node identified and excised.  Background counts approached baseline.  Hemostasis achieved with cautery, clips and Arista.  After assuring good hemostasis the wound was closed with 3-0 Vicryl and 4-0 Monocryl.  Of note the long thoracic nerve, thoracodorsal trunk and axillary vein were all preserved.  Dermabond applied to both incisions.  All counts were correct.  Breast binder placed.  The patient was awoke extubated taken to recovery in satisfactory condition.

## 2019-03-10 NOTE — Anesthesia Postprocedure Evaluation (Signed)
Anesthesia Post Note  Patient: [name]  Procedure(s) Performed: LEFT BREAST LUMPECTOMY WITH RADIOACTIVE SEED AND LEFT SENTINEL LYMPH NODE MAPPING (Left Breast) INSERTION PORT-A-CATH WITH ULTRASOUND (N/A Breast)     Patient location during evaluation: PACU Anesthesia Type: General Level of consciousness: awake and alert Pain management: pain level controlled Vital Signs Assessment: post-procedure vital signs reviewed and stable Respiratory status: spontaneous breathing, nonlabored ventilation, respiratory function stable and patient connected to nasal cannula oxygen Cardiovascular status: blood pressure returned to baseline and stable Postop Assessment: no apparent nausea or vomiting Anesthetic complications: no    Last Vitals:  Vitals:   03/10/19 1035 03/10/19 1039  BP: (!) 153/72   Pulse: (!) 119 (!) 121  Resp: 12 (!) 23  Temp:    SpO2: 96% 96%    Last Pain:  Vitals:   03/10/19 1020  TempSrc:   PainSc: 0-No pain                 Pervis Hocking

## 2019-03-10 NOTE — Interval H&P Note (Signed)
History and Physical Interval Note:  03/10/2019 7:21 AM  Cindy [name]  has presented today for surgery, with the diagnosis of LEFT BREAST CANCER.  The various methods of treatment have been discussed with the patient and family. After consideration of risks, benefits and other options for treatment, the patient has consented to  Procedure(s): LEFT BREAST LUMPECTOMY WITH RADIOACTIVE SEED AND LEFT SENTINEL LYMPH NODE MAPPING (Left) INSERTION PORT-A-CATH WITH ULTRASOUND (N/A) as a surgical intervention.  The patient's history has been reviewed, patient examined, no change in status, stable for surgery.  I have reviewed the patient's chart and labs.  Questions were answered to the patient's satisfaction.     [name]

## 2019-03-10 NOTE — Anesthesia Procedure Notes (Signed)
Anesthesia Regional Block: Pectoralis block   Pre-Anesthetic Checklist: ,, timeout performed, Correct Patient, Correct Site, Correct Laterality, Correct Procedure, Correct Position, site marked, Risks and benefits discussed,  Surgical consent,  Pre-op evaluation,  At surgeon's request and post-op pain management  Laterality: Left  Prep: Maximum Sterile Barrier Precautions used, chloraprep       Needles:  Injection technique: Single-shot  Needle Type: Echogenic Stimulator Needle     Needle Length: 9cm  Needle Gauge: 22     Additional Needles:   Procedures:,,,, ultrasound used (permanent image in chart),,,,  Narrative:  Start time: 03/10/2019 7:05 AM End time: 03/10/2019 7:15 AM Injection made incrementally with aspirations every 5 mL.  Performed by: Personally  Anesthesiologist: Pervis Hocking, DO  Additional Notes: Monitors applied. No increased pain on injection. No increased resistance to injection. Injection made in 5cc increments. Good needle visualization. Patient tolerated procedure well.

## 2019-03-10 NOTE — Discharge Instructions (Signed)
[facility] Office Phone Number [phone]  BREAST BIOPSY/ PARTIAL MASTECTOMY: POST OP INSTRUCTIONS  Always review your discharge instruction sheet given to you by the facility where your surgery was performed.  IF YOU HAVE DISABILITY OR FAMILY LEAVE FORMS, YOU MUST BRING THEM TO THE OFFICE FOR PROCESSING.  DO NOT GIVE THEM TO YOUR DOCTOR.  1. A prescription for pain medication may be given to you upon discharge.  Take your pain medication as prescribed, if needed.  If narcotic pain medicine is not needed, then you may take acetaminophen (Tylenol) or ibuprofen (Advil) as needed. 2. Take your usually prescribed medications unless otherwise directed 3. If you need a refill on your pain medication, please contact your pharmacy.  They will contact our office to request authorization.  Prescriptions will not be filled after 5pm or on week-ends. 4. You should eat very light the first 24 hours after surgery, such as soup, crackers, pudding, etc.  Resume your normal diet the day after surgery. 5. Most patients will experience some swelling and bruising in the breast.  Ice packs and a good support bra will help.  Swelling and bruising can take several days to resolve.  6. It is common to experience some constipation if taking pain medication after surgery.  Increasing fluid intake and taking a stool softener will usually help or prevent this problem from occurring.  A mild laxative (Milk of Magnesia or Miralax) should be taken according to package directions if there are no bowel movements after 48 hours. 7. Unless discharge instructions indicate otherwise, you may remove your bandages 24-48 hours after surgery, and you may shower at that time.  You may have steri-strips (small skin tapes) in place directly over the incision.  These strips should be left on the skin for 7-10 days.  If your surgeon used skin glue on the incision, you may shower in 24 hours.  The glue will flake off over the  next 2-3 weeks.  Any sutures or staples will be removed at the office during your follow-up visit. 8. ACTIVITIES:  You may resume regular daily activities (gradually increasing) beginning the next day.  Wearing a good support bra or sports bra minimizes pain and swelling.  You may have sexual intercourse when it is comfortable. a. You may drive when you no longer are taking prescription pain medication, you can comfortably wear a seatbelt, and you can safely maneuver your car and apply brakes. b. RETURN TO WORK:  ______________________________________________________________________________________ 9. You should see your doctor in the office for a follow-up appointment approximately two weeks after your surgery.  Your doctor's nurse will typically make your follow-up appointment when she calls you with your pathology report.  Expect your pathology report 2-3 business days after your surgery.  You may call to check if you do not hear from us after three days. 10. OTHER INSTRUCTIONS: _______________________________________________________________________________________________ _____________________________________________________________________________________________________________________________________ _____________________________________________________________________________________________________________________________________ _____________________________________________________________________________________________________________________________________  WHEN TO CALL YOUR DOCTOR: 1. Fever over 101.0 2. Nausea and/or vomiting. 3. Extreme swelling or bruising. 4. Continued bleeding from incision. 5. Increased pain, redness, or drainage from the incision.  The clinic staff is available to answer your questions during regular business hours.  Please don't hesitate to call and ask to speak to one of the nurses for clinical concerns.  If you have a medical emergency, go to the nearest  emergency room or call 911.  A surgeon from [facility] is always on call at the hospital.  For further questions, please visit [email or web address]        PORT-A-CATH: POST OP INSTRUCTIONS  Always review your discharge instruction sheet given to you by the facility where your surgery was performed.   1. A prescription for pain medication may be given to you upon discharge. Take your pain medication as prescribed, if needed. If narcotic pain medicine is not needed, then you make take acetaminophen (Tylenol) or ibuprofen (Advil) as needed.  2. Take your usually prescribed medications unless otherwise directed. 3. If you need a refill on your pain medication, please contact our office. All narcotic pain medicine now requires a paper prescription.  Phoned in and fax refills are no longer allowed by law.  Prescriptions will not be filled after 5 pm or on weekends.  4. You should follow a light diet for the remainder of the day after your procedure. 5. Most patients will experience some mild swelling and/or bruising in the area of the incision. It may take several days to resolve. 6. It is common to experience some constipation if taking pain medication after surgery. Increasing fluid intake and taking a stool softener (such as Colace) will usually help or prevent this problem from occurring. A mild laxative (Milk of Magnesia or Miralax) should be taken according to package directions if there are no bowel movements after 48 hours.  7. Unless discharge instructions indicate otherwise, you may remove your bandages 48 hours after surgery, and you may shower at that time. You may have steri-strips (small white skin tapes) in place directly over the incision.  These strips should be left on the skin for 7-10 days.  If your surgeon used Dermabond (skin glue) on the incision, you may shower in 24 hours.  The glue will flake off over the next 2-3 weeks.  8. If your port is left accessed at the  end of surgery (needle left in port), the dressing cannot get wet and should only by changed by a healthcare professional. When the port is no longer accessed (when the needle has been removed), follow step 7.   9. ACTIVITIES:  Limit activity involving your arms for the next 72 hours. Do no strenuous exercise or activity for 1 week. You may drive when you are no longer taking prescription pain medication, you can comfortably wear a seatbelt, and you can maneuver your car. 10.You may need to see your doctor in the office for a follow-up appointment.  Please       check with your doctor.  11.When you receive a new Port-a-Cath, you will get a product guide and        ID card.  Please keep them in case you need them.  WHEN TO CALL YOUR DOCTOR ([phone]): 1. Fever over 101.0 2. Chills 3. Continued bleeding from incision 4. Increased redness and tenderness at the site 5. Shortness of breath, difficulty breathing   The clinic staff is available to answer your questions during regular business hours. Please don't hesitate to call and ask to speak to one of the nurses or medical assistants for clinical concerns. If you have a medical emergency, go to the nearest emergency room or call 911.  A surgeon from [name] is always on call at the hospital.     For further information, please visit [email or web address]     

## 2019-03-11 ENCOUNTER — Encounter (HOSPITAL_COMMUNITY): Payer: Self-pay | Admitting: Surgery

## 2019-03-11 LAB — SURGICAL PATHOLOGY

## 2019-03-12 ENCOUNTER — Encounter: Payer: Self-pay | Admitting: *Deleted

## 2019-03-13 ENCOUNTER — Telehealth: Payer: Self-pay | Admitting: *Deleted

## 2019-03-13 ENCOUNTER — Other Ambulatory Visit (HOSPITAL_COMMUNITY): Payer: PPO

## 2019-03-13 NOTE — Telephone Encounter (Signed)
Called pt to assess needs or questions after sx. Relate doing well not in any pain, just "uncomfortable at the incision sites". Discussed moving appt to 10/12 per Dr. Burr Medico. Pt agrees to move appt. Denies questions or needs at this time.

## 2019-03-16 ENCOUNTER — Telehealth: Payer: Self-pay | Admitting: Hematology

## 2019-03-16 NOTE — Telephone Encounter (Signed)
Rescheduled appt per 10/2 sch message.  Called and spoke with patient and she is aware of the new appt date and time.

## 2019-03-17 ENCOUNTER — Encounter (HOSPITAL_COMMUNITY): Payer: PPO

## 2019-03-19 ENCOUNTER — Other Ambulatory Visit: Payer: Self-pay | Admitting: Family Medicine

## 2019-03-19 ENCOUNTER — Other Ambulatory Visit: Payer: Self-pay

## 2019-03-19 ENCOUNTER — Ambulatory Visit (HOSPITAL_BASED_OUTPATIENT_CLINIC_OR_DEPARTMENT_OTHER)
Admission: RE | Admit: 2019-03-19 | Discharge: 2019-03-19 | Disposition: A | Discharge status: Home / Self Care | Payer: PPO | Source: Ambulatory Visit | Attending: Family Medicine | Admitting: Family Medicine

## 2019-03-19 ENCOUNTER — Other Ambulatory Visit (HOSPITAL_COMMUNITY)
Admission: RE | Admit: 2019-03-19 | Discharge: 2019-03-19 | Disposition: A | Discharge status: Home / Self Care | Payer: PPO | Source: Ambulatory Visit | Attending: Family Medicine | Admitting: Family Medicine

## 2019-03-19 ENCOUNTER — Ambulatory Visit (INDEPENDENT_AMBULATORY_CARE_PROVIDER_SITE_OTHER): Payer: PPO | Admitting: Family Medicine

## 2019-03-19 ENCOUNTER — Encounter: Payer: Self-pay | Admitting: Family Medicine

## 2019-03-19 VITALS — BP 116/82 | HR 92 | Temp 98.0°F | Resp 18 | Ht 64.0 in | Wt 163.0 lb

## 2019-03-19 DIAGNOSIS — C50919 Malignant neoplasm of unspecified site of unspecified female breast: Secondary | ICD-10-CM | POA: Diagnosis not present

## 2019-03-19 DIAGNOSIS — Z124 Encounter for screening for malignant neoplasm of cervix: Secondary | ICD-10-CM

## 2019-03-19 DIAGNOSIS — F329 Major depressive disorder, single episode, unspecified: Secondary | ICD-10-CM | POA: Diagnosis not present

## 2019-03-19 DIAGNOSIS — E785 Hyperlipidemia, unspecified: Secondary | ICD-10-CM

## 2019-03-19 DIAGNOSIS — M25552 Pain in left hip: Secondary | ICD-10-CM

## 2019-03-19 DIAGNOSIS — F32A Depression, unspecified: Secondary | ICD-10-CM

## 2019-03-19 DIAGNOSIS — G43909 Migraine, unspecified, not intractable, without status migrainosus: Secondary | ICD-10-CM | POA: Diagnosis not present

## 2019-03-19 DIAGNOSIS — C50212 Malignant neoplasm of upper-inner quadrant of left female breast: Secondary | ICD-10-CM

## 2019-03-19 DIAGNOSIS — Z0000 Encounter for general adult medical examination without abnormal findings: Secondary | ICD-10-CM

## 2019-03-19 DIAGNOSIS — Z171 Estrogen receptor negative status [ER-]: Secondary | ICD-10-CM

## 2019-03-19 DIAGNOSIS — R7309 Other abnormal glucose: Secondary | ICD-10-CM | POA: Diagnosis not present

## 2019-03-19 DIAGNOSIS — R739 Hyperglycemia, unspecified: Secondary | ICD-10-CM | POA: Diagnosis not present

## 2019-03-19 MED ORDER — FLUOXETINE HCL 40 MG PO CAPS: 40.0000 mg | ORAL_CAPSULE | Freq: Every day | ORAL | 0 refills | Status: DC

## 2019-03-19 NOTE — Patient Instructions (Signed)
Pulse oximeter want numbers in 90s  Multivitamin with minerals, selenium, zinc, vitamin c and d  Preventive Care 67 Years and Older, Female Preventive care refers to lifestyle choices and visits with your health care provider that can promote health and wellness. This includes:  A yearly physical exam. This is also called an annual well check.  Regular dental and eye exams.  Immunizations.  Screening for certain conditions.  Healthy lifestyle choices, such as diet and exercise. What can I expect for my preventive care visit? Physical exam Your health care provider will check:  Height and weight. These may be used to calculate body mass index (BMI), which is a measurement that tells if you are at a healthy weight.  Heart rate and blood pressure.  Your skin for abnormal spots. Counseling Your health care provider may ask you questions about:  Alcohol, tobacco, and drug use.  Emotional well-being.  Home and relationship well-being.  Sexual activity.  Eating habits.  History of falls.  Memory and ability to understand (cognition).  Work and work Statistician.  Pregnancy and menstrual history. What immunizations do I need?  Influenza (flu) vaccine  This is recommended every year. Tetanus, diphtheria, and pertussis (Tdap) vaccine  You may need a Td booster every 10 years. Varicella (chickenpox) vaccine  You may need this vaccine if you have not already been vaccinated. Zoster (shingles) vaccine  You may need this after age 107. Pneumococcal conjugate (PCV13) vaccine  One dose is recommended after age 65. Pneumococcal polysaccharide (PPSV23) vaccine  One dose is recommended after age 61. Measles, mumps, and rubella (MMR) vaccine  You may need at least one dose of MMR if you were born in 1957 or later. You may also need a second dose. Meningococcal conjugate (MenACWY) vaccine  You may need this if you have certain conditions. Hepatitis A vaccine  You may  need this if you have certain conditions or if you travel or work in places where you may be exposed to hepatitis A. Hepatitis B vaccine  You may need this if you have certain conditions or if you travel or work in places where you may be exposed to hepatitis B. Haemophilus influenzae type b (Hib) vaccine  You may need this if you have certain conditions. You may receive vaccines as individual doses or as more than one vaccine together in one shot (combination vaccines). Talk with your health care provider about the risks and benefits of combination vaccines. What tests do I need? Blood tests  Lipid and cholesterol levels. These may be checked every 5 years, or more frequently depending on your overall health.  Hepatitis C test.  Hepatitis B test. Screening  Lung cancer screening. You may have this screening every year starting at age 59 if you have a 30-pack-year history of smoking and currently smoke or have quit within the past 15 years.  Colorectal cancer screening. All adults should have this screening starting at age 30 and continuing until age 54. Your health care provider may recommend screening at age 72 if you are at increased risk. You will have tests every 1-10 years, depending on your results and the type of screening test.  Diabetes screening. This is done by checking your blood sugar (glucose) after you have not eaten for a while (fasting). You may have this done every 1-3 years.  Mammogram. This may be done every 1-2 years. Talk with your health care provider about how often you should have regular mammograms.  BRCA-related cancer screening.  This may be done if you have a family history of breast, ovarian, tubal, or peritoneal cancers. Other tests  Sexually transmitted disease (STD) testing.  Bone density scan. This is done to screen for osteoporosis. You may have this done starting at age 36. Follow these instructions at home: Eating and drinking  Eat a diet that  includes fresh fruits and vegetables, whole grains, lean protein, and low-fat dairy products. Limit your intake of foods with high amounts of sugar, saturated fats, and salt.  Take vitamin and mineral supplements as recommended by your health care provider.  Do not drink alcohol if your health care provider tells you not to drink.  If you drink alcohol: ? Limit how much you have to 0-1 drink a day. ? Be aware of how much alcohol is in your drink. In the [place] one drink equals one 12 oz bottle of beer (355 mL), one 5 oz glass of wine (148 mL), or one 1 oz glass of hard liquor (44 mL). Lifestyle  Take daily care of your teeth and gums.  Stay active. Exercise for at least 30 minutes on 5 or more days each week.  Do not use any products that contain nicotine or tobacco, such as cigarettes, e-cigarettes, and chewing tobacco. If you need help quitting, ask your health care provider.  If you are sexually active, practice safe sex. Use a condom or other form of protection in order to prevent STIs (sexually transmitted infections).  Talk with your health care provider about taking a low-dose aspirin or statin. What's next?  Go to your health care provider once a year for a well check visit.  Ask your health care provider how often you should have your eyes and teeth checked.  Stay up to date on all vaccines. This information is not intended to replace advice given to you by your health care provider. Make sure you discuss any questions you have with your health care provider. Document Released: 06/24/2015 Document Revised: 05/22/2018 Document Reviewed: 05/22/2018 Elsevier Patient Education  2020 [employer].

## 2019-03-19 NOTE — Assessment & Plan Note (Signed)
Pap today, no concerns on exam.  

## 2019-03-19 NOTE — Progress Notes (Signed)
[name]   [phone] [phone]   Clinic Follow up Note   Patient Care Team: Mosie Lukes, MD as PCP - General (Family Medicine) Cherylann Banas, Cherly Anderson, MD (Inactive) (Obstetrics and Gynecology) Rockwell Germany, RN as Oncology Nurse Navigator Mauro Kaufmann, RN as Oncology Nurse Navigator Erroll Luna, MD as Consulting Physician (General Surgery) Truitt Merle, MD as Consulting Physician (Hematology) Gery Pray, MD as Consulting Physician (Radiation Oncology)  Date of Service:  03/23/2019  CHIEF COMPLAINT: F/u of left breast cancer  SUMMARY OF ONCOLOGIC HISTORY: Oncology History Overview Note  Cancer Staging Malignant neoplasm of upper-inner quadrant of left breast in female, estrogen receptor negative ([place]) Staging form: Breast, AJCC 8th Edition - Clinical stage from 02/06/2019: Stage IB (cT1c, cN0, cM0, G2, ER-, PR-, HER2-) - Signed by Truitt Merle, MD on 02/17/2019 - Pathologic stage from 03/10/2019: Stage IIA (pT2, pN0, cM0, G3, ER-, PR-, HER2-) - Signed by Truitt Merle, MD on 03/12/2019    Malignant neoplasm of upper-inner quadrant of left breast in female, estrogen receptor negative ([place])  02/04/2019 Mammogram   Diagnostic mammogram 02/04/19  IMPRESSION: Highly suspicious 1.7 x1.5 x1.7cm at 10:00 position of UPPER INNER LEFT breast mass 10 cm from nipple corresponding to the screening study finding. Tissue sampling recommended.   No abnormal LEFT axillary lymph nodes.   02/06/2019 Cancer Staging   Staging form: Breast, AJCC 8th Edition - Clinical stage from 02/06/2019: Stage IB (cT1c, cN0, cM0, G2, ER-, PR-, HER2-) - Signed by Truitt Merle, MD on 02/17/2019   02/06/2019 Initial Biopsy   Diagnosis 02/06/19 Breast, left, needle core biopsy, 10 o'clock, 10cm/n, ribbon clip - INVASIVE MAMMARY CARCINOMA.   02/06/2019 Receptors her2   Results: IMMUNOHISTOCHEMICAL AND MORPHOMETRIC ANALYSIS PERFORMED MANUALLY The tumor cells are NEGATIVE for Her2  (1+). Estrogen Receptor: 0%, NEGATIVE Progesterone Receptor: 0%, NEGATIVE Proliferation Marker Ki67: 40%   02/17/2019 Initial Diagnosis   Malignant neoplasm of upper-inner quadrant of left breast in female, estrogen receptor negative ([place])   03/10/2019 Cancer Staging   Staging form: Breast, AJCC 8th Edition - Pathologic stage from 03/10/2019: Stage IIA (pT2, pN0, cM0, G3, ER-, PR-, HER2-) - Signed by Truitt Merle, MD on 03/12/2019   03/10/2019 Surgery   LEFT BREAST LUMPECTOMY WITH RADIOACTIVE SEED AND LEFT SENTINEL LYMPH NODE MAPPING and PAC placement  by Dr. Brantley Stage  03/10/19   03/10/2019 Pathology Results   DIAGNOSIS: 03/10/19  A. BREAST, LEFT, LUMPECTOMY:  - Invasive ductal carcinoma, grade 3, 2.5 cm  - Carcinoma is 0.3 cm from superior margin  - Medial resection edge is 0.1 cm from carcinoma (final medial margin is  represented by part 2 which is negative for carcinoma)  - Negative for lymphovascular or perineural invasion  - Biopsy site changes  - See oncology table   B. BREAST, LEFT MEDIAL MARGIN, EXCISION:  - Benign breast parenchyma, negative for carcinoma   C. LYMPH NODE, LEFT AXILLARY, SENTINEL BIOPSY:  - Lymph node, negative for carcinoma (0/1)     Chemotherapy   PENDING chemo AC q2weeks for 4 cycles starting 04/08/19 followed by Taxol for 2-3 months.        CURRENT THERAPY:  PENDING chemo AC q2weeks for 4 cycles starting 04/08/19 followed by Taxol for 2-3 months.   INTERVAL HISTORY:  Cindy [name] is here for a follow up left breast cancer. She presents to the clinic alone. She feels she has recovered well from surgery. She has occasional shooting pain or tingling of left  arm, hand, breast  She plans to f/u with her surgeon in 1-2 weeks. She notes she has friends and neighbors who are supportive and can help her during treatment. She can find someone to bring her for first chemo. She notes concern about where to do treatment given she lives in [place].  She notes  having a small lump of her posterior scalp. She does not remember bumping her head.     REVIEW OF SYSTEMS:   Constitutional: Denies fevers, chills or abnormal weight loss Eyes: Denies blurriness of vision Ears, nose, mouth, throat, and face: Denies mucositis or sore throat Respiratory: Denies cough, dyspnea or wheezes Cardiovascular: Denies palpitation, chest discomfort or lower extremity swelling Gastrointestinal:  Denies nausea, heartburn or change in bowel habits Skin: Denies abnormal skin rashes (+) Small lump of posterior scalp Lymphatics: Denies new lymphadenopathy or easy bruising Neurological:Denies numbness, tingling or new weaknesses Behavioral/Psych: Mood is stable, no new changes  All other systems were reviewed with the patient and are negative.  MEDICAL HISTORY:  Past Medical History:  Diagnosis Date  . Allergy   . Depression    ? if bipolar works the best  . Elevated fasting blood sugar    hx  . H/O measles   . Headache   . History of chicken pox   . Hx of migraines   . Microscopic hematuria    advised to see urology ? never went  . Preventative health care 06/20/2011    SURGICAL HISTORY: Past Surgical History:  Procedure Laterality Date  . BREAST LUMPECTOMY WITH RADIOACTIVE SEED AND SENTINEL LYMPH NODE BIOPSY Left 03/10/2019   Procedure: LEFT BREAST LUMPECTOMY WITH RADIOACTIVE SEED AND LEFT SENTINEL LYMPH NODE MAPPING;  Surgeon: Erroll Luna, MD;  Location: [facility];  Service: General;  Laterality: Left;  . endometrial polyps     2 surgeries by Dr Cathlean Cower  . EYE SURGERY  04/29/2009   Rt eye tighten up  . PORTACATH PLACEMENT N/A 03/10/2019   Procedure: INSERTION PORT-A-CATH WITH ULTRASOUND;  Surgeon: Erroll Luna, MD;  Location: [facility];  Service: General;  Laterality: N/A;  . steel plate in skull Left    from a crushed skull    I have reviewed the social history and family history with the patient and they are unchanged from previous note.   ALLERGIES:  is allergic to bromfed and venlafaxine.  MEDICATIONS:  Current Outpatient Medications  Medication Sig Dispense Refill  . acidophilus (RISAQUAD) CAPS capsule Take 1 capsule by mouth daily.    Marland Kitchen FLUoxetine (PROZAC) 40 MG capsule Take 1 capsule (40 mg total) by mouth daily. 90 capsule 0  . Multiple Vitamins-Minerals (MULTIVITAMIN WITH MINERALS) tablet Take 1 tablet by mouth daily.    Marland Kitchen ALPRAZolam (XANAX) 0.25 MG tablet Take 1 tablet (0.25 mg total) by mouth 2 (two) times daily as needed for anxiety. (Patient not taking: Reported on 03/02/2019) 20 tablet 0  . Ascorbic Acid (VITAMIN C) 1000 MG tablet Take 1,000 mg by mouth daily.    . B Complex-C (SUPER B COMPLEX PO) Take 1 tablet by mouth daily.    Marland Kitchen HYDROcodone-acetaminophen (NORCO/VICODIN) 5-325 MG tablet Take 1 tablet by mouth every 6 (six) hours as needed for moderate pain. (Patient not taking: Reported on 03/19/2019) 15 tablet 0  . ibuprofen (ADVIL) 800 MG tablet Take 1 tablet (800 mg total) by mouth every 8 (eight) hours as needed. (Patient not taking: Reported on 03/19/2019) 30 tablet 0   No current facility-administered medications for this  visit.     PHYSICAL EXAMINATION: ECOG PERFORMANCE STATUS: 1 - Symptomatic but completely ambulatory  Vitals:   03/23/19 1448  BP: 113/68  Pulse: 84  Resp: 18  Temp: 98.2 F (36.8 C)  SpO2: 98%   Filed Weights   03/23/19 1448  Weight: 163 lb 9.6 oz (74.2 kg)    GENERAL:alert, no distress and comfortable SKIN: skin color, texture, turgor are normal, no rashes or significant lesions (+) PAC in place, clean (+) very small lump of posterior scalp EYES: normal, Conjunctiva are pink and non-injected, sclera clear  NECK: supple, thyroid normal size, non-tender, without nodularity LYMPH:  no palpable lymphadenopathy in the cervical, axillary  LUNGS: clear to auscultation and percussion with normal breathing effort HEART: regular rate & rhythm and no murmurs and no lower extremity edema  ABDOMEN:abdomen soft, non-tender and normal bowel sounds Musculoskeletal:no cyanosis of digits and no clubbing  NEURO: alert & oriented x 3 with fluent speech, no focal motor/sensory deficits BREAST: S/p left lumpectomy: surgical incision healing well, tender. No palpable mass, nodules or adenopathy bilaterally. Breast exam benign.   LABORATORY DATA:  I have reviewed the data as listed CBC Latest Ref Rng & Units 03/05/2019 02/18/2019 12/27/2017  WBC 4.0 - 10.5 K/uL 5.6 6.4 5.7  Hemoglobin 12.0 - 15.0 g/dL 13.9 14.4 14.0  Hematocrit 36.0 - 46.0 % 42.2 42.4 41.1  Platelets 150 - 400 K/uL 265 281 247.0     CMP Latest Ref Rng & Units 03/05/2019 02/18/2019 01/30/2018  Glucose 70 - 99 mg/dL 109(H) 88 119(H)  BUN 8 - 23 mg/dL _0 Creatinine 0.44 - 1.00 mg/dL 0.82 0.98 0.93  Sodium 135 - 145 mmol/L 139 140 139  Potassium 3.5 - 5.1 mmol/L 4.5 4.4 4.6  Chloride 98 - 111 mmol/L 104 102 104  CO2 22 - 32 mmol/L _1 Calcium 8.9 - 10.3 mg/dL 9.5 9.7 9.5  Total Protein 6.5 - 8.1 g/dL 6.9 7.6 7.3  Total Bilirubin 0.3 - 1.2 mg/dL 0.4 0.4 0.5  Alkaline Phos 38 - 126 U/L 65 65 59  AST 15 - 41 U/L _2 ALT 0 - 44 U/L _3 RADIOGRAPHIC STUDIES: I have personally reviewed the radiological images as listed and agreed with the findings in the report. No results found.   ASSESSMENT & PLAN:  Cindy [name] is a 68 y.o. female with   1 Malignant neoplasm of upper-inner quadrant of left breast, Stage IIA, p(T2N0M0), ER/PR/HER2-, triple negative, Grade III -She was recently diagnosed in 02/2019. She underwent left breast lumpectomy with SLNB on 03/10/19.  -We discussed her pathology report which shows grade III 2.5cm tumor which was completely removed with clear margins, node negative. She had PAC placed during surgery.  -I recommend staging CT CAP/bone scan to rule out distant metastasis. She agreed to proceed.  -Given her aggressive triple negative breast cancer, adjuvant chemo is  recommended to reduce her risk of recurrence. Given her stage II disease, I recommend more intensive chemo with dose dense Adriamycin and Cytoxan q2weeks for 4 cycles followed by Taxol weekly for 12 weeks or every 2 weeks for 4 cycles.  Other chemo regiment, such as docetaxel and Cytoxan, were discussed with her also.  She has overall good health, given the aggressive nature of stage II triple negative disease, I recommend AC-T, and she agrees --Chemotherapy consent: Side effects including but does not limited to, fatigue, nausea, vomiting, diarrhea, hair  loss, neuropathy, fluid retention, renal and kidney dysfunction, neutropenic fever, needed for blood transfusion, bleeding, cardiomyopathy which could be irreversible, small risk of leukemia, were discussed with patient in great detail. She agrees to proceed. -Adjuvant radiation with Dr. Sondra Come is also recommended post chemo to reduce her risk of local recurrence.  -Given ER and PR negative expression, antiestrogen therapy is not recommended.  -I discussed with Taxol she is eligible for our observational clinical trail about neuropathy, she maybe eligible for the UPBEAT trial also. She is interested. Will screen her  -I will give growth factor injection to prevent neutropenic fever. Will see if insurance is eligible for Onpro patch. If not eligible may be able to have injection in [facility].  -Will do Chemo education class before start of chemo.  -Given risk of heart failure, will do baseline and follow up Echos, she is agreeable. Given she has metal plates in her head she is not eligible for the UPBEAT study -We also discussed the option of DiginCap for hair preservation. She declined.  -Physical exam shows she is healing well so far from surgery. To give her more time to heal, plan to start chemo in 2 weeks.    2. Multiple Skin lesions in chest  -She has multiple moles and skin lesions of her b/l breasts and chest. The largest skin lesion on right  breast measuring 4x5cm.  -I recommend she have this evaluated by her dermatologist. She is agreeable.   3. Genetic Testing  -Her mother had colon cancer and father had bladder cancer, her MGF had cancer and her maternal great grandmother had breast cancer.  -I will check with Genetics to see if she is eligible for testing. She is interested in insurance covers it.    PLAN:  -I will call in EMLA cream today  -CT CAP W contrast and bone scan in 1-2 weeks in [facility] med center  -Echo next week  -Chemo education class next week  -Lab, flush, f/u and chemo AC every 2 weeks X4 starting 10/28  -clinical trial screening    No problem-specific Assessment & Plan notes found for this encounter.   Orders Placed This Encounter  Procedures  . CT Abdomen Pelvis W Contrast    Standing Status:   Future    Standing Expiration Date:   03/22/2020    Order Specific Question:   If indicated for the ordered procedure, I authorize the administration of contrast media per Radiology protocol    Answer:   Yes    Order Specific Question:   Preferred imaging location?    Answer:   [facility] Specific Question:   Is Oral Contrast requested for this exam?    Answer:   Yes, Per Radiology protocol    Order Specific Question:   Radiology Contrast Protocol - do NOT remove file path    Answer:   \\[email or web address]  . CT Chest W Contrast    Standing Status:   Future    Standing Expiration Date:   03/22/2020    Order Specific Question:   If indicated for the ordered procedure, I authorize the administration of contrast media per Radiology protocol    Answer:   Yes    Order Specific Question:   Preferred imaging location?    Answer:   [facility] Specific Question:   Radiology Contrast Protocol - do NOT remove file path    Answer:   \\[email or web address]  .  NM Bone Scan Whole Body    Standing Status:   Future    Standing  Expiration Date:   03/22/2020    Order Specific Question:   If indicated for the ordered procedure, I authorize the administration of a radiopharmaceutical per Radiology protocol    Answer:   Yes    Order Specific Question:   Preferred imaging location?    Answer:   [facility]    Order Specific Question:   Radiology Contrast Protocol - do NOT remove file path    Answer:   \\[email or web address]   All questions were answered. The patient knows to call the clinic with any problems, questions or concerns. No barriers to learning was detected. I spent 30 minutes counseling the patient face to face. The total time spent in the appointment was 40 minutes and more than 50% was on counseling and review of test results     Truitt Merle, MD 03/23/2019   I, Joslyn Devon, am acting as scribe for Truitt Merle, MD.   I have reviewed the above documentation for accuracy and completeness, and I agree with the above.

## 2019-03-20 LAB — LIPID PANEL
Cholesterol: 178 mg/dL (ref 0–200)
HDL: 61.3 mg/dL (ref >39.00)
LDL Cholesterol: 101 mg/dL — ABNORMAL HIGH (ref 0–99)
NonHDL: 116.24
Total CHOL/HDL Ratio: 3
Triglycerides: 74 mg/dL (ref 0.0–149.0)
VLDL: 14.8 mg/dL (ref 0.0–40.0)

## 2019-03-20 LAB — HEMOGLOBIN A1C: Hgb A1c MFr Bld: 6 % (ref 4.6–6.5)

## 2019-03-20 LAB — TSH: TSH: 1.09 u[IU]/mL (ref 0.35–4.50)

## 2019-03-22 DIAGNOSIS — C50919 Malignant neoplasm of unspecified site of unspecified female breast: Secondary | ICD-10-CM | POA: Insufficient documentation

## 2019-03-22 DIAGNOSIS — M25552 Pain in left hip: Secondary | ICD-10-CM | POA: Insufficient documentation

## 2019-03-22 NOTE — Assessment & Plan Note (Signed)
Patient encouraged to maintain heart healthy diet, regular exercise, adequate sleep. Consider daily probiotics. Take medications as prescribed 

## 2019-03-22 NOTE — Assessment & Plan Note (Deleted)
Is recovering from surgery well. Her tumor was bigger than anticipated 2.5 cm. She has a follow up appointment with Dr Morey Hummingbird soon to discuss further course of treatment.

## 2019-03-22 NOTE — Assessment & Plan Note (Signed)
Is recovering from surgery well. Her tumor was bigger than anticipated 2.5 cm. She has a follow up appointment with Dr Morey Hummingbird soon to discuss further course of treatment.

## 2019-03-22 NOTE — Assessment & Plan Note (Signed)
After discussion decision is made to maintain current dosing of Fluoxetine for now.

## 2019-03-22 NOTE — Assessment & Plan Note (Signed)
Encouraged increased hydration, 64 ounces of clear fluids daily. Minimize alcohol and caffeine. Eat small frequent meals with lean proteins and complex carbs. Avoid high and low blood sugars. Get adequate sleep, 7-8 hours a night. Needs exercise daily preferably in the morning.  

## 2019-03-22 NOTE — Assessment & Plan Note (Signed)
hgba1c acceptable, minimize simple carbs. Increase exercise as tolerated.  

## 2019-03-22 NOTE — Progress Notes (Signed)
Subjective:    Patient ID: Cindy [name], female    DOB: [birth year], 68 y.o.   MRN: [id number]  No chief complaint on file.   HPI Patient is in today for annual preventative exam and follow up on chronic medical concerns including depression, headaches, allergies and more. Unfortunately she was just diagnosed with breast cancer and has just completed surgery. She has an appointment soon with Dr Morey Hummingbird her oncologist to confirm next steps. She hs a port in place for her chemo. She does not like that it is there but no complications. Despite this stressful new diagnosis she reports she is doing well and is considering coming down on dosing of Fluoxetine. Denies CP/palp/SOB/HA/congestion/fevers/GI or GU c/o. Taking meds as prescribed. Does note some pain at lateral aspect of left hip. No fall or trauma. No incontinence.   Past Medical History:  Diagnosis Date  . Allergy   . Depression    ? if bipolar works the best  . Elevated fasting blood sugar    hx  . H/O measles   . Headache   . History of chicken pox   . Hx of migraines   . Microscopic hematuria    advised to see urology ? never went  . Preventative health care 06/20/2011    Past Surgical History:  Procedure Laterality Date  . BREAST LUMPECTOMY WITH RADIOACTIVE SEED AND SENTINEL LYMPH NODE BIOPSY Left 03/10/2019   Procedure: LEFT BREAST LUMPECTOMY WITH RADIOACTIVE SEED AND LEFT SENTINEL LYMPH NODE MAPPING;  Surgeon: Erroll Luna, MD;  Location: [facility];  Service: General;  Laterality: Left;  . endometrial polyps     2 surgeries by Dr Cathlean Cower  . EYE SURGERY  04/29/2009   Rt eye tighten up  . PORTACATH PLACEMENT N/A 03/10/2019   Procedure: INSERTION PORT-A-CATH WITH ULTRASOUND;  Surgeon: Erroll Luna, MD;  Location: [facility];  Service: General;  Laterality: N/A;  . steel plate in skull Left    from a crushed skull    Family History  Problem Relation Age of Onset  . Alzheimer's disease Mother   . Cancer Mother 3   colon  . Thyroid disease Father   . COPD Father   . Cancer Father        bladder with cystectomy  . Cancer Maternal Grandfather        colon cancer    Social History   Socioeconomic History  . Marital status: Single    Spouse name: Not on file  . Number of children: Not on file  . Years of education: Not on file  . Highest education level: Not on file  Occupational History  . Occupation: retired   Scientific laboratory technician  . Financial resource strain: Not on file  . Food insecurity    Worry: Not on file    Inability: Not on file  . Transportation needs    Medical: Not on file    Non-medical: Not on file  Tobacco Use  . Smoking status: Former Smoker    Packs/day: 0.25    Years: 10.00    Pack years: 2.50  . Smokeless tobacco: Never Used  . Tobacco comment: social smoker in her 20-30's   Substance and Sexual Activity  . Alcohol use: Not Currently    Alcohol/week: 1.0 - 2.0 standard drinks    Types: 1 - 2 Glasses of wine per week    Comment: very rare on social occasions  . Drug use: No  . Sexual activity: Not on file  Lifestyle  . Physical activity    Days per week: Not on file    Minutes per session: Not on file  . Stress: Not on file  Relationships  . Social Herbalist on phone: Not on file    Gets together: Not on file    Attends religious service: Not on file    Active member of club or organization: Not on file    Attends meetings of clubs or organizations: Not on file    Relationship status: Not on file  . Intimate partner violence    Fear of current or ex partner: Not on file    Emotionally abused: Not on file    Physically abused: Not on file    Forced sexual activity: Not on file  Other Topics Concern  . Not on file  Social History Narrative   HH of 1   2 Cats   Caffeine 2 mugs   Exercise recently some, no major dietary restrictions at this time. Was vegan now eats chicken and poultry   Works at [employer] in [place]    Outpatient Medications Prior  to Visit  Medication Sig Dispense Refill  . acidophilus (RISAQUAD) CAPS capsule Take 1 capsule by mouth daily.    . Ascorbic Acid (VITAMIN C) 1000 MG tablet Take 1,000 mg by mouth daily.    . B Complex-C (SUPER B COMPLEX PO) Take 1 tablet by mouth daily.    . Multiple Vitamins-Minerals (MULTIVITAMIN WITH MINERALS) tablet Take 1 tablet by mouth daily.    Marland Kitchen FLUoxetine (PROZAC) 40 MG capsule Take 1 capsule (40 mg total) by mouth daily. 90 capsule 3  . ALPRAZolam (XANAX) 0.25 MG tablet Take 1 tablet (0.25 mg total) by mouth 2 (two) times daily as needed for anxiety. (Patient not taking: Reported on 03/02/2019) 20 tablet 0  . HYDROcodone-acetaminophen (NORCO/VICODIN) 5-325 MG tablet Take 1 tablet by mouth every 6 (six) hours as needed for moderate pain. (Patient not taking: Reported on 03/19/2019) 15 tablet 0  . ibuprofen (ADVIL) 800 MG tablet Take 1 tablet (800 mg total) by mouth every 8 (eight) hours as needed. (Patient not taking: Reported on 03/19/2019) 30 tablet 0   No facility-administered medications prior to visit.     Allergies  Allergen Reactions  . Bromfed Shortness Of Breath  . Venlafaxine     Doesn't remember reaction    Review of Systems  Constitutional: Negative for chills, fever and malaise/fatigue.  HENT: Negative for congestion and hearing loss.   Eyes: Negative for discharge.  Respiratory: Negative for cough, sputum production and shortness of breath.   Cardiovascular: Negative for chest pain, palpitations and leg swelling.  Gastrointestinal: Negative for abdominal pain, blood in stool, constipation, diarrhea, heartburn, nausea and vomiting.  Genitourinary: Negative for dysuria, frequency, hematuria and urgency.  Musculoskeletal: Negative for back pain, falls and myalgias.  Skin: Negative for rash.  Neurological: Negative for dizziness, sensory change, loss of consciousness, weakness and headaches.  Endo/Heme/Allergies: Negative for environmental allergies. Does not  bruise/bleed easily.  Psychiatric/Behavioral: Negative for depression and suicidal ideas. The patient is not nervous/anxious and does not have insomnia.        Objective:    Physical Exam  BP 116/82 (BP Location: Left Arm, Patient Position: Sitting, Cuff Size: Normal)   Pulse 92   Temp 98 F (36.7 C) (Temporal)   Resp 18   Ht 5\' 4"  (1.626 m)   Wt 163 lb (73.9 kg)   SpO2 98%  BMI 27.98 kg/m  Wt Readings from Last 3 Encounters:  03/19/19 163 lb (73.9 kg)  03/10/19 165 lb 8 oz (75.1 kg)  03/05/19 165 lb 8 oz (75.1 kg)    Diabetic Foot Exam - Simple   No data filed     Lab Results  Component Value Date   WBC 5.6 03/05/2019   HGB 13.9 03/05/2019   HCT 42.2 03/05/2019   PLT 265 03/05/2019   GLUCOSE 109 (H) 03/05/2019   CHOL 178 03/19/2019   TRIG 74.0 03/19/2019   HDL 61.30 03/19/2019   LDLDIRECT 123.1 06/25/2011   LDLCALC 101 (H) 03/19/2019   ALT 18 03/05/2019   AST 18 03/05/2019   NA 139 03/05/2019   K 4.5 03/05/2019   CL 104 03/05/2019   CREATININE 0.82 03/05/2019   BUN 14 03/05/2019   CO2 27 03/05/2019   TSH 1.09 03/19/2019   HGBA1C 6.0 03/19/2019    Lab Results  Component Value Date   TSH 1.09 03/19/2019   Lab Results  Component Value Date   WBC 5.6 03/05/2019   HGB 13.9 03/05/2019   HCT 42.2 03/05/2019   MCV 95.3 03/05/2019   PLT 265 03/05/2019   Lab Results  Component Value Date   NA 139 03/05/2019   K 4.5 03/05/2019   CO2 27 03/05/2019   GLUCOSE 109 (H) 03/05/2019   BUN 14 03/05/2019   CREATININE 0.82 03/05/2019   BILITOT 0.4 03/05/2019   ALKPHOS 65 03/05/2019   AST 18 03/05/2019   ALT 18 03/05/2019   PROT 6.9 03/05/2019   ALBUMIN 3.8 03/05/2019   CALCIUM 9.5 03/05/2019   ANIONGAP 8 03/05/2019   GFR 64.15 01/30/2018   Lab Results  Component Value Date   CHOL 178 03/19/2019   Lab Results  Component Value Date   HDL 61.30 03/19/2019   Lab Results  Component Value Date   LDLCALC 101 (H) 03/19/2019   Lab Results  Component  Value Date   TRIG 74.0 03/19/2019   Lab Results  Component Value Date   CHOLHDL 3 03/19/2019   Lab Results  Component Value Date   HGBA1C 6.0 03/19/2019       Assessment & Plan:   Problem List Items Addressed This Visit    Depression    After discussion decision is made to maintain current dosing of Fluoxetine for now.       Relevant Medications   FLUoxetine (PROZAC) 40 MG capsule   Migraine headache    Encouraged increased hydration, 64 ounces of clear fluids daily. Minimize alcohol and caffeine. Eat small frequent meals with lean proteins and complex carbs. Avoid high and low blood sugars. Get adequate sleep, 7-8 hours a night. Needs exercise daily preferably in the morning.      Relevant Medications   FLUoxetine (PROZAC) 40 MG capsule   HYPERGLYCEMIA    hgba1c acceptable, minimize simple carbs. Increase exercise as tolerated.      Cervical cancer screening    Pap today, no concerns on exam.       Relevant Orders   Cytology - PAP( [place])   Preventative health care    Patient encouraged to maintain heart healthy diet, regular exercise, adequate sleep. Consider daily probiotics. Take medications as prescribed.      Malignant neoplasm of upper-inner quadrant of left breast in female, estrogen receptor negative ([place])    Is recovering from surgery well. Her tumor was bigger than anticipated 2.5 cm. She has a follow up appointment with Dr  Morey Hummingbird soon to discuss further course of treatment.       Left hip pain    Encouraged moist heat and gentle stretching as tolerated. May try NSAIDs and prescription meds as directed and report if symptoms worsen or seek immediate care. Xray does not show any acute concerns. She will report if no resolution and she is ready for referral to sports med or ortho      RESOLVED: Breast cancer ([place])    Other Visit Diagnoses    Hip pain, acute, left    -  Primary   Hyperlipidemia, unspecified hyperlipidemia type       Relevant Orders    Lipid panel (Completed)   TSH (Completed)   Hyperglycemia       Relevant Orders   Hemoglobin A1c (Completed)   TSH (Completed)      I am having Cindy [name] maintain her ALPRAZolam, acidophilus, vitamin C, B Complex-C (SUPER B COMPLEX PO), multivitamin with minerals, HYDROcodone-acetaminophen, ibuprofen, and FLUoxetine.  Meds ordered this encounter  Medications  . FLUoxetine (PROZAC) 40 MG capsule    Sig: Take 1 capsule (40 mg total) by mouth daily.    Dispense:  90 capsule    Refill:  0     Penni Homans, MD

## 2019-03-22 NOTE — Assessment & Plan Note (Signed)
Encouraged moist heat and gentle stretching as tolerated. May try NSAIDs and prescription meds as directed and report if symptoms worsen or seek immediate care. Xray does not show any acute concerns. She will report if no resolution and she is ready for referral to sports med or ortho

## 2019-03-23 ENCOUNTER — Inpatient Hospital Stay: Payer: PPO | Attending: Hematology | Admitting: Hematology

## 2019-03-23 ENCOUNTER — Encounter: Payer: Self-pay | Admitting: Family Medicine

## 2019-03-23 ENCOUNTER — Encounter: Payer: Self-pay | Admitting: Hematology

## 2019-03-23 ENCOUNTER — Other Ambulatory Visit: Payer: Self-pay

## 2019-03-23 VITALS — BP 113/68 | HR 84 | Temp 98.2°F | Resp 18 | Ht 64.0 in | Wt 163.6 lb

## 2019-03-23 DIAGNOSIS — L989 Disorder of the skin and subcutaneous tissue, unspecified: Secondary | ICD-10-CM | POA: Diagnosis not present

## 2019-03-23 DIAGNOSIS — C50212 Malignant neoplasm of upper-inner quadrant of left female breast: Secondary | ICD-10-CM | POA: Diagnosis not present

## 2019-03-23 DIAGNOSIS — Z8052 Family history of malignant neoplasm of bladder: Secondary | ICD-10-CM | POA: Insufficient documentation

## 2019-03-23 DIAGNOSIS — Z5189 Encounter for other specified aftercare: Secondary | ICD-10-CM | POA: Insufficient documentation

## 2019-03-23 DIAGNOSIS — Z79899 Other long term (current) drug therapy: Secondary | ICD-10-CM | POA: Insufficient documentation

## 2019-03-23 DIAGNOSIS — Z800 Family history of malignant neoplasm of digestive organs: Secondary | ICD-10-CM | POA: Diagnosis not present

## 2019-03-23 DIAGNOSIS — F329 Major depressive disorder, single episode, unspecified: Secondary | ICD-10-CM | POA: Insufficient documentation

## 2019-03-23 DIAGNOSIS — Z5111 Encounter for antineoplastic chemotherapy: Secondary | ICD-10-CM | POA: Insufficient documentation

## 2019-03-23 DIAGNOSIS — R220 Localized swelling, mass and lump, head: Secondary | ICD-10-CM | POA: Diagnosis not present

## 2019-03-23 DIAGNOSIS — Z791 Long term (current) use of non-steroidal anti-inflammatories (NSAID): Secondary | ICD-10-CM | POA: Insufficient documentation

## 2019-03-23 DIAGNOSIS — Z171 Estrogen receptor negative status [ER-]: Secondary | ICD-10-CM | POA: Insufficient documentation

## 2019-03-23 DIAGNOSIS — Z803 Family history of malignant neoplasm of breast: Secondary | ICD-10-CM | POA: Insufficient documentation

## 2019-03-24 ENCOUNTER — Telehealth: Payer: Self-pay | Admitting: Hematology

## 2019-03-24 ENCOUNTER — Telehealth: Payer: Self-pay

## 2019-03-24 ENCOUNTER — Encounter: Payer: Self-pay | Admitting: Family Medicine

## 2019-03-24 ENCOUNTER — Encounter: Payer: Self-pay | Admitting: Hematology

## 2019-03-24 ENCOUNTER — Other Ambulatory Visit: Payer: Self-pay | Admitting: Hematology

## 2019-03-24 LAB — CYTOLOGY - PAP: Diagnosis: NEGATIVE

## 2019-03-24 MED ORDER — PROCHLORPERAZINE MALEATE 10 MG PO TABS: 10.0000 mg | ORAL_TABLET | Freq: Four times a day (QID) | ORAL | 1 refills | Status: DC | PRN

## 2019-03-24 MED ORDER — ONDANSETRON HCL 8 MG PO TABS: 8.0000 mg | ORAL_TABLET | Freq: Two times a day (BID) | ORAL | 1 refills | Status: DC | PRN

## 2019-03-24 MED ORDER — LIDOCAINE-PRILOCAINE 2.5-2.5 % EX CREA: TOPICAL_CREAM | CUTANEOUS | 3 refills | Status: DC

## 2019-03-24 NOTE — Telephone Encounter (Signed)
Scheduled appt per 10/12 los.  Spoke with pt and she is aware of the appt date and time.

## 2019-03-24 NOTE — Addendum Note (Signed)
Addended by: Truitt Merle on: 03/24/2019 01:20 PM   Modules accepted: Orders

## 2019-03-24 NOTE — Telephone Encounter (Signed)
Spoke with Cindy [name] in research regarding Dr. Burr Medico would like her screened for the neuropathy study or any other study which is appropriate. She is not eligible for the UPBEAT study because she cannot do the MRI.  Research will reach out to the patient.

## 2019-03-24 NOTE — Progress Notes (Signed)
START ON PATHWAY REGIMEN - Breast     Cycles 1 through 4 = every 14 days:     Doxorubicin      Cyclophosphamide      Pegfilgrastim-xxxx    Cycles 5 through 8 = every 14 days:     Paclitaxel      Pegfilgrastim-xxxx   **Always confirm dose/schedule in your pharmacy ordering system**  Patient Characteristics: Postoperative without Neoadjuvant Therapy (Pathologic Staging), Invasive Disease, Adjuvant Therapy, HER2 Negative/Unknown/Equivocal, ER Negative/Unknown, Node Negative, pT1a-c, N34m or pT1c or Higher, pN0 Therapeutic Status: Postoperative without Neoadjuvant Therapy (Pathologic Staging) AJCC Grade: G3 AJCC N Category: pN0 AJCC M Category: cM0 ER Status: Negative (-) AJCC 8 Stage Grouping: IIA HER2 Status: Negative (-) Oncotype Dx Recurrence Score: Not Appropriate AJCC T Category: pT2 PR Status: Negative (-) Intent of Therapy: Curative Intent, Discussed with Patient

## 2019-03-26 ENCOUNTER — Ambulatory Visit (HOSPITAL_BASED_OUTPATIENT_CLINIC_OR_DEPARTMENT_OTHER)
Admission: RE | Admit: 2019-03-26 | Discharge: 2019-03-26 | Disposition: A | Discharge status: Home / Self Care | Payer: PPO | Source: Ambulatory Visit | Attending: Hematology | Admitting: Hematology

## 2019-03-26 ENCOUNTER — Other Ambulatory Visit: Payer: Self-pay

## 2019-03-26 DIAGNOSIS — Z171 Estrogen receptor negative status [ER-]: Secondary | ICD-10-CM

## 2019-03-26 DIAGNOSIS — Z711 Person with feared health complaint in whom no diagnosis is made: Secondary | ICD-10-CM | POA: Diagnosis not present

## 2019-03-26 DIAGNOSIS — C50212 Malignant neoplasm of upper-inner quadrant of left female breast: Secondary | ICD-10-CM | POA: Diagnosis not present

## 2019-03-26 DIAGNOSIS — Z01810 Encounter for preprocedural cardiovascular examination: Secondary | ICD-10-CM | POA: Diagnosis not present

## 2019-03-26 NOTE — Progress Notes (Signed)
  Echocardiogram 2D Echocardiogram has been performed.    Cardell Peach 03/26/2019, 3:44 PM

## 2019-03-27 ENCOUNTER — Ambulatory Visit: Payer: PPO | Admitting: Hematology

## 2019-03-30 ENCOUNTER — Telehealth: Payer: Self-pay | Admitting: Hematology

## 2019-03-30 ENCOUNTER — Telehealth: Payer: Self-pay

## 2019-03-30 ENCOUNTER — Other Ambulatory Visit: Payer: PPO

## 2019-03-30 NOTE — Telephone Encounter (Signed)
Spoke with patient to let her know the results from her most recent echo, and that Dr. Burr Medico made a referral to a cardiologist before she starts treatment on 04/08/19. Patient verbalized understanding and agreement, and denied any other needs at this time.

## 2019-03-30 NOTE — Telephone Encounter (Signed)
-----   Message from Truitt Merle, MD sent at 03/27/2019 10:09 PM EDT ----- Please let pt know about the referral.  Thanks   Krista Blue  ----- Message ----- From: Truitt Merle, MD Sent: 03/27/2019  10:06 PM EDT To: Jonnie Finner, RN, Rockwell Germany, RN, #  Heather,   Her echo showed slightly low EF. She is scheduled to start chemo AC on 10/28. Could you schedule her to see Dr. Haroldine Laws or Aundra Dubin next week? Depends on their recommendation, I may change her chemo regimen.  Thanks much  [employer]

## 2019-03-31 ENCOUNTER — Inpatient Hospital Stay: Payer: PPO

## 2019-04-01 ENCOUNTER — Ambulatory Visit (HOSPITAL_COMMUNITY)
Admission: RE | Admit: 2019-04-01 | Discharge: 2019-04-01 | Disposition: A | Discharge status: Home / Self Care | Payer: PPO | Source: Ambulatory Visit | Attending: Internal Medicine | Admitting: Internal Medicine

## 2019-04-01 ENCOUNTER — Encounter (HOSPITAL_COMMUNITY): Payer: Self-pay | Admitting: Internal Medicine

## 2019-04-01 ENCOUNTER — Other Ambulatory Visit: Payer: Self-pay

## 2019-04-01 VITALS — BP 121/65 | HR 74 | Wt 165.0 lb

## 2019-04-01 DIAGNOSIS — F329 Major depressive disorder, single episode, unspecified: Secondary | ICD-10-CM | POA: Diagnosis not present

## 2019-04-01 DIAGNOSIS — Z171 Estrogen receptor negative status [ER-]: Secondary | ICD-10-CM | POA: Diagnosis not present

## 2019-04-01 DIAGNOSIS — R002 Palpitations: Secondary | ICD-10-CM | POA: Insufficient documentation

## 2019-04-01 DIAGNOSIS — C50212 Malignant neoplasm of upper-inner quadrant of left female breast: Secondary | ICD-10-CM | POA: Insufficient documentation

## 2019-04-01 DIAGNOSIS — Z853 Personal history of malignant neoplasm of breast: Secondary | ICD-10-CM | POA: Insufficient documentation

## 2019-04-01 DIAGNOSIS — Z87891 Personal history of nicotine dependence: Secondary | ICD-10-CM | POA: Insufficient documentation

## 2019-04-01 NOTE — Progress Notes (Signed)
CARDIO-ONCOLOGY CLINIC CONSULT NOTE  Referring Physician: Dr. Burr Medico Primary Care: Dr. Penni Homans Oncology: Dr. Burr Medico  HPI:  Cindy [name] is a 68 y.o. female former [employer] employee with h/o depression and migraine HAs as well as left breast cancer referred by Dr. Burr Medico for enrollment into the Cardio-Oncology program.  Oncology History Overview Note   Cancer Staging Malignant neoplasm of upper-inner quadrant of left breast in female, estrogen receptor negative ([place]) Staging form: Breast, AJCC 8th Edition - Clinical stage from 02/06/2019: Stage IB (cT1c, cN0, cM0, G2, ER-, PR-, HER2-) - Signed by Truitt Merle, MD on 02/17/2019 - Pathologic stage from 03/10/2019: Stage IIA (pT2, pN0, cM0, G3, ER-, PR-, HER2-) - Signed by Truitt Merle, MD on 03/12/2019    Malignant neoplasm of upper-inner quadrant of left breast in female, estrogen receptor negative ([place])   02/04/2019 Mammogram    Diagnostic mammogram 02/04/19  IMPRESSION: Highly suspicious 1.7 x1.5 x1.7cm at 10:00 position of UPPER INNER LEFT breast mass 10 cm from nipple corresponding to the screening study finding. Tissue sampling recommended.  No abnormal LEFT axillary lymph nodes.    02/06/2019 Cancer Staging    Staging form: Breast, AJCC 8th Edition - Clinical stage from 02/06/2019: Stage IB (cT1c, cN0, cM0, G2, ER-, PR-, HER2-) - Signed by Truitt Merle, MD on 02/17/2019    02/06/2019 Initial Biopsy    Diagnosis 02/06/19 Breast, left, needle core biopsy, 10 o'clock, 10cm/n, ribbon clip - INVASIVE MAMMARY CARCINOMA.    02/06/2019 Receptors her2    Results: IMMUNOHISTOCHEMICAL AND MORPHOMETRIC ANALYSIS PERFORMED MANUALLY The tumor cells are NEGATIVE for Her2 (1+). Estrogen Receptor: 0%, NEGATIVE Progesterone Receptor: 0%, NEGATIVE Proliferation Marker Ki67: 40%    02/17/2019 Initial Diagnosis    Malignant neoplasm of upper-inner quadrant of left breast in female, estrogen receptor negative ([place])    03/10/2019 Cancer Staging    Staging  form: Breast, AJCC 8th Edition - Pathologic stage from 03/10/2019: Stage IIA (pT2, pN0, cM0, G3, ER-, PR-, HER2-) - Signed by Truitt Merle, MD on 03/12/2019    03/10/2019 Surgery    LEFT BREAST LUMPECTOMY WITH RADIOACTIVE SEED AND LEFT SENTINEL LYMPH NODE MAPPING and PAC placement  by Dr. Brantley Stage  03/10/19    03/10/2019 Pathology Results    DIAGNOSIS: 03/10/19  A. BREAST, LEFT, LUMPECTOMY:  - Invasive ductal carcinoma, grade 3, 2.5 cm  - Carcinoma is 0.3 cm from superior margin  - Medial resection edge is 0.1 cm from carcinoma (final medial margin is  represented by part 2 which is negative for carcinoma)  - Negative for lymphovascular or perineural invasion  - Biopsy site changes  - See oncology table   B. BREAST, LEFT MEDIAL MARGIN, EXCISION:  - Benign breast parenchyma, negative for carcinoma   C. LYMPH NODE, LEFT AXILLARY, SENTINEL BIOPSY:  - Lymph node, negative for carcinoma (0/1)      Chemotherapy    PENDING chemo AC q2weeks for 4 cycles starting 04/08/19 followed by Taxol for 2-3 months.          Recently diagnosed with left breast cancer after experiencing left breast pain. Has had lumpectomy and sentinnel node biopsy.  Pending chemo AC q2weeks for 4 cycles starting 04/08/19 followed by Taxol for 2-3 months.   As part of pre-chemo w/u had echo that was read as EF 50-55% with possible mild apical HK. Denies any h/o heart disease or significant CRFs. Smoked but quit at age 69s. No CP. Tries to walk every but has a hard time with the heat.  Ok when it is cool. Can do all ADLs without problem. No edema, orthopnea or PND. Occasional mild DOE. Says SOB worse after gaining weight. Occasionally has skipped beats or brief heart flutter.   I reviewed echo personally ECHO: EF 60-65% with grade 2 DD no RWMA. On the strain imaging mild decrease in apical strain due to poor endocardial tracking (it is normal)   Review of Systems: [y] = yes, [ ]  = no   General: Weight gain  [ ] ; Weight loss [ ] ; Anorexia [ ] ; Fatigue [ ] ; Fever [ ] ; Chills [ ] ; Weakness [ ]   Cardiac: Chest pain/pressure [ ] ; Resting SOB [ ] ; Exertional SOB [ y]; Orthopnea [ ] ; Pedal Edema [ ] ; Palpitations [ ] ; Syncope [ ] ; Presyncope [ ] ; Paroxysmal nocturnal dyspnea[ ]   Pulmonary: Cough [ ] ; Wheezing[ ] ; Hemoptysis[ ] ; Sputum [ ] ; Snoring [ ]   GI: Vomiting[ ] ; Dysphagia[ ] ; Melena[ ] ; Hematochezia [ ] ; Heartburn[ ] ; Abdominal pain [ ] ; Constipation [ ] ; Diarrhea [ ] ; BRBPR [ ]   GU: Hematuria[ ] ; Dysuria [ ] ; Nocturia[ ]   Vascular: Pain in legs with walking [ ] ; Pain in feet with lying flat [ ] ; Non-healing sores [ ] ; Stroke [ ] ; TIA [ ] ; Slurred speech [ ] ;  Neuro: Headaches[ y]; Vertigo[ ] ; Seizures[ ] ; Paresthesias[ ] ;Blurred vision [ ] ; Diplopia [ ] ; Vision changes [ ]   Ortho/Skin: Arthritis [ ] ; Joint pain [ ] ; Muscle pain [ ] ; Joint swelling [ ] ; Back Pain [ ] ; Rash [ ]   Psych: Depression[ y]; Anxiety[ ]   Heme: Bleeding problems [ ] ; Clotting disorders [ ] ; Anemia [ ]   Endocrine: Diabetes [ ] ; Thyroid dysfunction[ ]    Past Medical History:  Diagnosis Date  . Allergy   . Depression    ? if bipolar works the best  . Elevated fasting blood sugar    hx  . H/O measles   . Headache   . History of chicken pox   . Hx of migraines   . Microscopic hematuria    advised to see urology ? never went  . Preventative health care 06/20/2011    Current Outpatient Medications  Medication Sig Dispense Refill  . acidophilus (RISAQUAD) CAPS capsule Take 1 capsule by mouth daily.    Marland Kitchen FLUoxetine (PROZAC) 40 MG capsule Take 1 capsule (40 mg total) by mouth daily. 90 capsule 0  . lidocaine-prilocaine (EMLA) cream Apply to affected area once 30 g 3  . Multiple Vitamins-Minerals (MULTIVITAMIN WITH MINERALS) tablet Take 1 tablet by mouth daily.    . ondansetron (ZOFRAN) 8 MG tablet Take 1 tablet (8 mg total) by mouth 2 (two) times daily as needed. Start on the third day after chemotherapy. 30 tablet 1  .  prochlorperazine (COMPAZINE) 10 MG tablet Take 1 tablet (10 mg total) by mouth every 6 (six) hours as needed (Nausea or vomiting). 30 tablet 1   No current facility-administered medications for this encounter.     Allergies  Allergen Reactions  . Bromfed Shortness Of Breath  . Venlafaxine     Doesn't remember reaction      Social History   Socioeconomic History  . Marital status: Single    Spouse name: Not on file  . Number of children: Not on file  . Years of education: Not on file  . Highest education level: Not on file  Occupational History  . Occupation: retired   Scientific laboratory technician  . Financial resource strain: Not on file  .  Food insecurity    Worry: Not on file    Inability: Not on file  . Transportation needs    Medical: Not on file    Non-medical: Not on file  Tobacco Use  . Smoking status: Former Smoker    Packs/day: 0.25    Years: 10.00    Pack years: 2.50  . Smokeless tobacco: Never Used  . Tobacco comment: social smoker in her 20-30's   Substance and Sexual Activity  . Alcohol use: Not Currently    Alcohol/week: 1.0 - 2.0 standard drinks    Types: 1 - 2 Glasses of wine per week    Comment: very rare on social occasions  . Drug use: No  . Sexual activity: Not on file  Lifestyle  . Physical activity    Days per week: Not on file    Minutes per session: Not on file  . Stress: Not on file  Relationships  . Social Herbalist on phone: Not on file    Gets together: Not on file    Attends religious service: Not on file    Active member of club or organization: Not on file    Attends meetings of clubs or organizations: Not on file    Relationship status: Not on file  . Intimate partner violence    Fear of current or ex partner: Not on file    Emotionally abused: Not on file    Physically abused: Not on file    Forced sexual activity: Not on file  Other Topics Concern  . Not on file  Social History Narrative   HH of 1   2 Cats   Caffeine 2  mugs   Exercise recently some, no major dietary restrictions at this time. Was vegan now eats chicken and poultry   Works at [employer] in [place] History  Problem Relation Age of Onset  . Alzheimer's disease Mother   . Cancer Mother 83       colon  . Thyroid disease Father   . COPD Father   . Cancer Father        bladder with cystectomy  . Cancer Maternal Grandfather        colon cancer  . Cancer Other        maternal grant granmother had breast cancer     Vitals:   04/01/19 1015  BP: 121/65  Pulse: 74  SpO2: 99%  Weight: 74.8 kg (165 lb)    PHYSICAL EXAM: General:  Well appearing. No respiratory difficulty HEENT: normal Neck: supple. no JVD. Carotids 2+ bilat; no bruits. No lymphadenopathy or thryomegaly appreciated. Cor: PMI nondisplaced. Regular rate & rhythm. No rubs, gallops or murmurs. R port placed Lungs: clear Abdomen: soft, nontender, nondistended. No hepatosplenomegaly. No bruits or masses. Good bowel sounds. Extremities: no cyanosis, clubbing, rash, edema Neuro: alert & oriented x 3, cranial nerves grossly intact. moves all 4 extremities w/o difficulty. Affect pleasant.  ASSESSMENT & PLAN: 1. Left Breast Cancer - I reviewed echos personally. EF and Doppler parameters normal. I explained to her that echo was simply misinterpreted and her EF is completely normal. No HF on exam. I explained incidence of Adriamycin cardiotoxicity in detail include small possibility of very delayed toxicity. Will repeat echo after 3rd round of AC and then at 6 and 12 months.   2. Palpitations - suspect PVCs. If persist can place Zio.   Glori Bickers, MD  10:42 AM

## 2019-04-01 NOTE — Patient Instructions (Signed)
Your physician recommends that you schedule a follow-up appointment in: 8 weeks with echocardiogram

## 2019-04-02 ENCOUNTER — Other Ambulatory Visit: Payer: Self-pay | Admitting: Hematology

## 2019-04-02 ENCOUNTER — Encounter: Payer: Self-pay | Admitting: *Deleted

## 2019-04-06 ENCOUNTER — Other Ambulatory Visit: Payer: Self-pay

## 2019-04-06 ENCOUNTER — Encounter: Payer: PPO | Admitting: Physical Therapy

## 2019-04-06 ENCOUNTER — Ambulatory Visit (HOSPITAL_COMMUNITY)
Admission: RE | Admit: 2019-04-06 | Discharge: 2019-04-06 | Disposition: A | Discharge status: Home / Self Care | Payer: PPO | Source: Ambulatory Visit | Attending: Hematology | Admitting: Hematology

## 2019-04-06 ENCOUNTER — Encounter (HOSPITAL_COMMUNITY)
Admission: RE | Admit: 2019-04-06 | Discharge: 2019-04-06 | Disposition: A | Discharge status: Home / Self Care | Payer: PPO | Source: Ambulatory Visit | Attending: Hematology | Admitting: Hematology

## 2019-04-06 DIAGNOSIS — Z171 Estrogen receptor negative status [ER-]: Secondary | ICD-10-CM

## 2019-04-06 DIAGNOSIS — C50212 Malignant neoplasm of upper-inner quadrant of left female breast: Secondary | ICD-10-CM | POA: Insufficient documentation

## 2019-04-06 DIAGNOSIS — K449 Diaphragmatic hernia without obstruction or gangrene: Secondary | ICD-10-CM | POA: Diagnosis not present

## 2019-04-06 DIAGNOSIS — C50912 Malignant neoplasm of unspecified site of left female breast: Secondary | ICD-10-CM | POA: Diagnosis not present

## 2019-04-06 DIAGNOSIS — C50919 Malignant neoplasm of unspecified site of unspecified female breast: Secondary | ICD-10-CM | POA: Diagnosis not present

## 2019-04-06 MED ORDER — TECHNETIUM TC 99M MEDRONATE IV KIT
20.0000 | PACK | Freq: Once | INTRAVENOUS | Status: AC | PRN
  Administered 2019-04-06: 21 via INTRAVENOUS

## 2019-04-06 MED ORDER — IOHEXOL 300 MG/ML  SOLN
100.0000 mL | Freq: Once | INTRAMUSCULAR | Status: AC | PRN
  Administered 2019-04-06: 100 mL via INTRAVENOUS

## 2019-04-06 MED ORDER — SODIUM CHLORIDE (PF) 0.9 % IJ SOLN
INTRAMUSCULAR | Status: AC
  Filled 2019-04-06: qty 50

## 2019-04-07 ENCOUNTER — Other Ambulatory Visit: Payer: Self-pay

## 2019-04-07 DIAGNOSIS — Z171 Estrogen receptor negative status [ER-]: Secondary | ICD-10-CM

## 2019-04-07 DIAGNOSIS — C50212 Malignant neoplasm of upper-inner quadrant of left female breast: Secondary | ICD-10-CM

## 2019-04-07 NOTE — Progress Notes (Signed)
[name]   [phone] [phone]   Clinic Follow up Note   Patient Care Team: Mosie Lukes, MD as PCP - General (Family Medicine) Cherylann Banas, Cherly Anderson, MD (Inactive) (Obstetrics and Gynecology) Rockwell Germany, RN as Oncology Nurse Navigator Mauro Kaufmann, RN as Oncology Nurse Navigator Erroll Luna, MD as Consulting Physician (General Surgery) Truitt Merle, MD as Consulting Physician (Hematology) Gery Pray, MD as Consulting Physician (Radiation Oncology) 04/08/2019  CHIEF COMPLAINT: F/u left breast cancer   SUMMARY OF ONCOLOGIC HISTORY: Oncology History Overview Note  Cancer Staging Malignant neoplasm of upper-inner quadrant of left breast in female, estrogen receptor negative ([place]) Staging form: Breast, AJCC 8th Edition - Clinical stage from 02/06/2019: Stage IB (cT1c, cN0, cM0, G2, ER-, PR-, HER2-) - Signed by Truitt Merle, MD on 02/17/2019 - Pathologic stage from 03/10/2019: Stage IIA (pT2, pN0, cM0, G3, ER-, PR-, HER2-) - Signed by Truitt Merle, MD on 03/12/2019    Malignant neoplasm of upper-inner quadrant of left breast in female, estrogen receptor negative ([place])  02/04/2019 Mammogram   Diagnostic mammogram 02/04/19  IMPRESSION: Highly suspicious 1.7 x1.5 x1.7cm at 10:00 position of UPPER INNER LEFT breast mass 10 cm from nipple corresponding to the screening study finding. Tissue sampling recommended.   No abnormal LEFT axillary lymph nodes.   02/06/2019 Cancer Staging   Staging form: Breast, AJCC 8th Edition - Clinical stage from 02/06/2019: Stage IB (cT1c, cN0, cM0, G2, ER-, PR-, HER2-) - Signed by Truitt Merle, MD on 02/17/2019   02/06/2019 Initial Biopsy   Diagnosis 02/06/19 Breast, left, needle core biopsy, 10 o'clock, 10cm/n, ribbon clip - INVASIVE MAMMARY CARCINOMA.   02/06/2019 Receptors her2   Results: IMMUNOHISTOCHEMICAL AND MORPHOMETRIC ANALYSIS PERFORMED MANUALLY The tumor cells are NEGATIVE for Her2 (1+). Estrogen  Receptor: 0%, NEGATIVE Progesterone Receptor: 0%, NEGATIVE Proliferation Marker Ki67: 40%   02/17/2019 Initial Diagnosis   Malignant neoplasm of upper-inner quadrant of left breast in female, estrogen receptor negative ([place])   03/10/2019 Cancer Staging   Staging form: Breast, AJCC 8th Edition - Pathologic stage from 03/10/2019: Stage IIA (pT2, pN0, cM0, G3, ER-, PR-, HER2-) - Signed by Truitt Merle, MD on 03/12/2019   03/10/2019 Surgery   LEFT BREAST LUMPECTOMY WITH RADIOACTIVE SEED AND LEFT SENTINEL LYMPH NODE MAPPING and PAC placement  by Dr. Brantley Stage  03/10/19   03/10/2019 Pathology Results   DIAGNOSIS: 03/10/19  A. BREAST, LEFT, LUMPECTOMY:  - Invasive ductal carcinoma, grade 3, 2.5 cm  - Carcinoma is 0.3 cm from superior margin  - Medial resection edge is 0.1 cm from carcinoma (final medial margin is  represented by part 2 which is negative for carcinoma)  - Negative for lymphovascular or perineural invasion  - Biopsy site changes  - See oncology table   B. BREAST, LEFT MEDIAL MARGIN, EXCISION:  - Benign breast parenchyma, negative for carcinoma   C. LYMPH NODE, LEFT AXILLARY, SENTINEL BIOPSY:  - Lymph node, negative for carcinoma (0/1)     Chemotherapy   PENDING chemo AC q2weeks for 4 cycles starting 04/08/19 followed by Taxol for 2-3 months.     04/06/2019 Imaging   Bone scan IMPRESSION: Mild focus of uptake is seen involving the anterior portion of left upper rib, but there is no corresponding abnormality seen on the CT scan of the same day. Potentially this may represent degenerative change or secondary to overlying postsurgical change. Attention on follow-up is recommended.   No definite scintigraphic evidence of osseous metastases.   04/06/2019 Imaging  CT CAP IMPRESSION: 1. Surgical changes left breast with borderline enlarged left axillary lymph node. No other definite evidence for metastatic disease in the chest, abdomen, or pelvis. 2. Tiny bilateral pulmonary  nodules. Attention on follow-up recommended. 3. Scattered small hypoattenuating lesions in the liver parenchyma. Some of these have attenuation higher than would be expected for simple fluid and may be cysts complicated by proteinaceous debris or hemorrhage. MRI without and with contrast recommended to further evaluate. 4. Small to moderate hiatal hernia. 5. Aortic Atherosclerosis (ICD10-I70.0).   04/08/2019 -  Chemotherapy   The patient had DOXOrubicin (ADRIAMYCIN) chemo injection 110 mg, 60 mg/m2 = 110 mg, Intravenous,  Once, 0 of 4 cycles palonosetron (ALOXI) injection 0.25 mg, 0.25 mg, Intravenous,  Once, 0 of 4 cycles pegfilgrastim-jmdb (FULPHILA) injection 6 mg, 6 mg, Subcutaneous,  Once, 0 of 8 cycles cyclophosphamide (CYTOXAN) 1,100 mg in sodium chloride 0.9 % 250 mL chemo infusion, 600 mg/m2 = 1,100 mg, Intravenous,  Once, 0 of 4 cycles PACLitaxel (TAXOL) 318 mg in sodium chloride 0.9 % 500 mL chemo infusion (> 69m/m2), 175 mg/m2, Intravenous,  Once, 0 of 4 cycles fosaprepitant (EMEND) 150 mg, dexamethasone (DECADRON) 12 mg in sodium chloride 0.9 % 145 mL IVPB, , Intravenous,  Once, 0 of 4 cycles  for chemotherapy treatment.      CURRENT THERAPY: Adjuvant chemo AC q2weeks for 4 cycles starting 04/08/19 followed by Taxol for 2-3 months.  INTERVAL HISTORY: [name] for f/u and treatment as scheduled. She did not sleep well last night due to having to be up early. She usually takes melatonin at night. Feels well otherwise. Normal appetite. Her energy level is moderate at baseline, she attributes to lack of exercise during COVID19. Tries to walk 15-20 minutes. Has mild DOE, no cough or chest pain. Breast is still little sore, has full ROM. Incision healing well. Does not take pain meds. She had nausea after CT contrast, no vomiting, constipation, or diarrhea. Denies fever, chills. She had PAC placed, chemo education visit, echo, CT, and bone scan since last visit. She was  seen by a dermatologist who said her skin lesions are benign.    MEDICAL HISTORY:  Past Medical History:  Diagnosis Date   Allergy    Depression    ? if bipolar works the best   Elevated fasting blood sugar    hx   H/O measles    Headache    History of chicken pox    Hx of migraines    Microscopic hematuria    advised to see urology ? never went   Preventative health care 06/20/2011    SURGICAL HISTORY: Past Surgical History:  Procedure Laterality Date   BREAST LUMPECTOMY WITH RADIOACTIVE SEED AND SENTINEL LYMPH NODE BIOPSY Left 03/10/2019   Procedure: LEFT BREAST LUMPECTOMY WITH RADIOACTIVE SEED AND LEFT SENTINEL LYMPH NODE MAPPING;  Surgeon: CErroll Luna MD;  [place] [facility]  Service: General;  Laterality: Left;   endometrial polyps     2 surgeries by Dr GCathlean Cower  EYE SURGERY  04/29/2009   Rt eye tighten up   PORTACATH PLACEMENT N/A 03/10/2019   Procedure: INSERTION PORT-A-CATH WITH ULTRASOUND;  Surgeon: CErroll Luna MD;  [place] [facility]  Service: General;  Laterality: N/A;   steel plate in skull Left    from a crushed skull    I have reviewed the social history and family history with the patient and they are unchanged from previous note.  ALLERGIES:  is allergic to  bromfed and venlafaxine.  MEDICATIONS:  Current Outpatient Medications  Medication Sig Dispense Refill   acidophilus (RISAQUAD) CAPS capsule Take 1 capsule by mouth daily.     FLUoxetine (PROZAC) 40 MG capsule Take 1 capsule (40 mg total) by mouth daily. 90 capsule 0   lidocaine-prilocaine (EMLA) cream Apply to affected area once 30 g 3   Multiple Vitamins-Minerals (MULTIVITAMIN WITH MINERALS) tablet Take 1 tablet by mouth daily.     ondansetron (ZOFRAN) 8 MG tablet Take 1 tablet (8 mg total) by mouth 2 (two) times daily as needed. Start on the third day after chemotherapy. (Patient not taking: Reported on 04/08/2019) 30 tablet 1   prochlorperazine (COMPAZINE) 10 MG tablet  Take 1 tablet (10 mg total) by mouth every 6 (six) hours as needed (Nausea or vomiting). (Patient not taking: Reported on 04/08/2019) 30 tablet 1   No current facility-administered medications for this visit.     PHYSICAL EXAMINATION: ECOG PERFORMANCE STATUS: 0 - Asymptomatic  Vitals:   04/08/19 0836  BP: 122/67  Pulse: 68  Resp: 20  Temp: 98.3 F (36.8 C)  SpO2: 99%   Filed Weights   04/08/19 0836  Weight: 165 lb 12.8 oz (75.2 kg)    GENERAL:alert, no distress and comfortable SKIN: plaque rash to bilateral breasts and multiple moles EYES:  sclera clear NECK: without mass LYMPH:  no palpable cervical, supraclavicular, or axillary lymphadenopathy LUNGS: clear with normal breathing effort HEART: regular rate & rhythm, lower extremity edema ABDOMEN:abdomen soft NEURO: alert & oriented x 3 with fluent speech BREAST: s/p lumpectomy; incision appears healed with residual dermabond and mild scabbing; no erythema or drainage. Slight firmness superior to the incision. No other palpable mass in left breast or axilla.  PAC in place covered with dressing  LABORATORY DATA:  I have reviewed the data as listed CBC Latest Ref Rng & Units 04/08/2019 03/05/2019 02/18/2019  WBC 4.0 - 10.5 K/uL 5.3 5.6 6.4  Hemoglobin 12.0 - 15.0 g/dL 13.0 13.9 14.4  Hematocrit 36.0 - 46.0 % 38.7 42.2 42.4  Platelets 150 - 400 K/uL 235 265 281     CMP Latest Ref Rng & Units 04/08/2019 03/05/2019 02/18/2019  Glucose 70 - 99 mg/dL 112(H) 109(H) 88  BUN 8 - 23 mg/dL 23 14 19   Creatinine 0.44 - 1.00 mg/dL 0.80 0.82 0.98  Sodium 135 - 145 mmol/L 142 139 140  Potassium 3.5 - 5.1 mmol/L 4.1 4.5 4.4  Chloride 98 - 111 mmol/L 105 104 102  CO2 22 - 32 mmol/L 25 27 29   Calcium 8.9 - 10.3 mg/dL 9.4 9.5 9.7  Total Protein 6.5 - 8.1 g/dL 7.1 6.9 7.6  Total Bilirubin 0.3 - 1.2 mg/dL 0.3 0.4 0.4  Alkaline Phos 38 - 126 U/L 67 65 65  AST 15 - 41 U/L 15 18 15   ALT 0 - 44 U/L 14 18 14       RADIOGRAPHIC STUDIES: I  have personally reviewed the radiological images as listed and agreed with the findings in the report. Ct Chest W Contrast  Result Date: 04/06/2019 CLINICAL DATA:  Left-sided breast cancer. Staging. EXAM: CT CHEST, ABDOMEN, AND PELVIS WITH CONTRAST TECHNIQUE: Multidetector CT imaging of the chest, abdomen and pelvis was performed following the standard protocol during bolus administration of intravenous contrast. CONTRAST:  120m OMNIPAQUE IOHEXOL 300 MG/ML  SOLN COMPARISON:  None. FINDINGS: CT CHEST FINDINGS Cardiovascular: The heart size is normal. No substantial pericardial effusion. No thoracic aortic aneurysm. Right Port-A-Cath tip is positioned in the  distal SVC near the RA junction. Mediastinum/Nodes: No mediastinal lymphadenopathy. There is no hilar lymphadenopathy. No right axillary lymphadenopathy. Surgical clips are noted in both axillary regions. Surgical changes are noted in the left axilla with 9 mm short axis left axillary node visible on 19/2. Small subpectoral nodes are seen bilaterally without subpectoral lymphadenopathy. No evidence for internal mammary lymphadenopathy. Lungs/Pleura: Biapical pleuroparenchymal scarring. 2 mm right upper lobe pulmonary nodule is visualized on image 31/series 6. 2 mm left lower lobe nodule is visible on 94/6. No focal consolidation. No pleural effusion. Musculoskeletal: No worrisome lytic or sclerotic osseous abnormality. Lumpectomy defect noted in the medial left breast with small fluid collection compatible with postoperative seroma or hematoma. CT ABDOMEN PELVIS FINDINGS Hepatobiliary: Scattered small hypoattenuating lesions are seen in the liver parenchyma measuring up to about 13 mm in maximum size. No associated enhancement is perceptible in these lesions although attenuation in some is higher than would be expected for simple cysts. There is no evidence for gallstones, gallbladder wall thickening, or pericholecystic fluid. No intrahepatic or extrahepatic  biliary dilation. Pancreas: No focal mass lesion. No dilatation of the main duct. No intraparenchymal cyst. No peripancreatic edema. Spleen: No splenomegaly. No focal mass lesion. Adrenals/Urinary Tract: No adrenal nodule or mass. No suspicious abnormality in the right kidney. Several tiny hypoattenuating lesions noted left kidney including 7 mm interpolar lesion on 68/2. No evidence for hydroureter. The urinary bladder appears normal for the degree of distention. Stomach/Bowel: Small to moderate hiatal hernia. Duodenum is normally positioned as is the ligament of Treitz. No small bowel wall thickening. No small bowel dilatation. The terminal ileum is normal. The appendix is normal. No gross colonic mass. No colonic wall thickening. Vascular/Lymphatic: There is abdominal aortic atherosclerosis without aneurysm. There is no gastrohepatic or hepatoduodenal ligament lymphadenopathy. No intraperitoneal or retroperitoneal lymphadenopathy. No pelvic sidewall lymphadenopathy. Reproductive: Endometrial stripe measures 5 mm thickness on sagittal images. There is no adnexal mass. Other: No intraperitoneal free fluid. Musculoskeletal: No worrisome lytic or sclerotic osseous abnormality. IMPRESSION: 1. Surgical changes left breast with borderline enlarged left axillary lymph node. No other definite evidence for metastatic disease in the chest, abdomen, or pelvis. 2. Tiny bilateral pulmonary nodules. Attention on follow-up recommended. 3. Scattered small hypoattenuating lesions in the liver parenchyma. Some of these have attenuation higher than would be expected for simple fluid and may be cysts complicated by proteinaceous debris or hemorrhage. MRI without and with contrast recommended to further evaluate. 4. Small to moderate hiatal hernia. 5. Aortic Atherosclerosis (ICD10-I70.0). Electronically Signed   By: Misty Stanley M.D.   On: 04/06/2019 13:52   Nm Bone Scan Whole Body  Result Date: 04/06/2019 CLINICAL DATA:  Breast  cancer. EXAM: NUCLEAR MEDICINE WHOLE BODY BONE SCAN TECHNIQUE: Whole body anterior and posterior images were obtained approximately 3 hours after intravenous injection of radiopharmaceutical. RADIOPHARMACEUTICALS:  21.0 mCi Technetium-56mMDP IV COMPARISON:  CT scan of same day. FINDINGS: Mild focus of uptake is seen involving the anterior portion of a left upper rib. No corresponding abnormality is seen on the CT scan and potentially this may represent degenerative change or secondary to overlying postsurgical change. Attention on follow-up is recommended. No other areas of abnormal uptake are noted. IMPRESSION: Mild focus of uptake is seen involving the anterior portion of left upper rib, but there is no corresponding abnormality seen on the CT scan of the same day. Potentially this may represent degenerative change or secondary to overlying postsurgical change. Attention on follow-up is recommended. No definite  scintigraphic evidence of osseous metastases. Electronically Signed   By: Marijo Conception M.D.   On: 04/06/2019 16:57   Ct Abdomen Pelvis W Contrast  Result Date: 04/06/2019 CLINICAL DATA:  Left-sided breast cancer. Staging. EXAM: CT CHEST, ABDOMEN, AND PELVIS WITH CONTRAST TECHNIQUE: Multidetector CT imaging of the chest, abdomen and pelvis was performed following the standard protocol during bolus administration of intravenous contrast. CONTRAST:  167m OMNIPAQUE IOHEXOL 300 MG/ML  SOLN COMPARISON:  None. FINDINGS: CT CHEST FINDINGS Cardiovascular: The heart size is normal. No substantial pericardial effusion. No thoracic aortic aneurysm. Right Port-A-Cath tip is positioned in the distal SVC near the RA junction. Mediastinum/Nodes: No mediastinal lymphadenopathy. There is no hilar lymphadenopathy. No right axillary lymphadenopathy. Surgical clips are noted in both axillary regions. Surgical changes are noted in the left axilla with 9 mm short axis left axillary node visible on 19/2. Small  subpectoral nodes are seen bilaterally without subpectoral lymphadenopathy. No evidence for internal mammary lymphadenopathy. Lungs/Pleura: Biapical pleuroparenchymal scarring. 2 mm right upper lobe pulmonary nodule is visualized on image 31/series 6. 2 mm left lower lobe nodule is visible on 94/6. No focal consolidation. No pleural effusion. Musculoskeletal: No worrisome lytic or sclerotic osseous abnormality. Lumpectomy defect noted in the medial left breast with small fluid collection compatible with postoperative seroma or hematoma. CT ABDOMEN PELVIS FINDINGS Hepatobiliary: Scattered small hypoattenuating lesions are seen in the liver parenchyma measuring up to about 13 mm in maximum size. No associated enhancement is perceptible in these lesions although attenuation in some is higher than would be expected for simple cysts. There is no evidence for gallstones, gallbladder wall thickening, or pericholecystic fluid. No intrahepatic or extrahepatic biliary dilation. Pancreas: No focal mass lesion. No dilatation of the main duct. No intraparenchymal cyst. No peripancreatic edema. Spleen: No splenomegaly. No focal mass lesion. Adrenals/Urinary Tract: No adrenal nodule or mass. No suspicious abnormality in the right kidney. Several tiny hypoattenuating lesions noted left kidney including 7 mm interpolar lesion on 68/2. No evidence for hydroureter. The urinary bladder appears normal for the degree of distention. Stomach/Bowel: Small to moderate hiatal hernia. Duodenum is normally positioned as is the ligament of Treitz. No small bowel wall thickening. No small bowel dilatation. The terminal ileum is normal. The appendix is normal. No gross colonic mass. No colonic wall thickening. Vascular/Lymphatic: There is abdominal aortic atherosclerosis without aneurysm. There is no gastrohepatic or hepatoduodenal ligament lymphadenopathy. No intraperitoneal or retroperitoneal lymphadenopathy. No pelvic sidewall lymphadenopathy.  Reproductive: Endometrial stripe measures 5 mm thickness on sagittal images. There is no adnexal mass. Other: No intraperitoneal free fluid. Musculoskeletal: No worrisome lytic or sclerotic osseous abnormality. IMPRESSION: 1. Surgical changes left breast with borderline enlarged left axillary lymph node. No other definite evidence for metastatic disease in the chest, abdomen, or pelvis. 2. Tiny bilateral pulmonary nodules. Attention on follow-up recommended. 3. Scattered small hypoattenuating lesions in the liver parenchyma. Some of these have attenuation higher than would be expected for simple fluid and may be cysts complicated by proteinaceous debris or hemorrhage. MRI without and with contrast recommended to further evaluate. 4. Small to moderate hiatal hernia. 5. Aortic Atherosclerosis (ICD10-I70.0). Electronically Signed   By: EMisty StanleyM.D.   On: 04/06/2019 13:52     ASSESSMENT & PLAN: Cindy OTTAWAYis a 68y.o. 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 She underwent left breast lumpectomy with SLNB on 03/10/19.  -pathology report which  shows grade III 2.5cm tumor which was completely removed with clear margins, node negative. She had PAC placed during surgery.  -She underwent staging work-up on 10/26 which I reviewed with the patient today. Bone scan shows mild uptake in the left upper rib with no CT correlate, otherwise negative for osseous metastasis.  She reports she fell on her left side/rib in July.  -CT CAP shows surgical changes in the left breast with borderline enlarged left axillary lymph node, possibly related to recent surgery.  Plan to repeat ultrasound in 1 month, if the lymph node remains enlarged she may require additional biopsy.  Other findings include tiny bilateral pulmonary nodules, and scattered small hypoattenuating lesions in the liver  parenchyma, some of which have attenuation higher than would be expected for simple fluid and may be cysts complicated by proteinaceous debris or hemorrhage.  MRI with/without contrast is recommended, although the patient has metal plate in her head and cannot undergo MRI.  Plan to monitor on CT in the future.  Otherwise, no definite evidence of metastatic disease. -I reviewed her imaging with Dr. Burr Medico  -She underwent a baseline echo which shows EF 50-55%, she was seen by Dr. Haroldine Laws who feels this was misinterpreted and in fact has a normal EF, no evidence of heart failure.  She will proceed with planned chemotherapy with a repeat echo after cycle 3 AC -[name] appears well today, she continues to recover well from [facility] placement and lumpectomy, breast incision appears healed with residual dermabond and scabbing, without evidence of infection. -CBC and CMP are normal except nonfasting BG 112. -We again discussed potential side effects and symptom management related to her chemotherapy. -Proceed with cycle 1 ddAC today, return for Fulphila  on 10/30. She will take claritin for bone pain.  -I will have phone visit with her on 11/4  -F/u in 2 weeks with cycle 2   2. Multiple Skin lesionsin chest -She has multiple moles and skin lesions of her b/l breasts and chest. The largest skin lesion on right breast measuring 4x5cm.  -patient reports seeing a dermatologist who says her skin lesions are benign  3. Genetic Testing  -Her mother had colon cancer and father had bladder cancer, her MGF had cancer and her maternal great grandmother had breast cancer.  -Will check with Genetics to see if she is eligible for testing. She is interested in insurance covers it.   PLAN: -Labs, CT, bone scan, echo reviewed -Proceed with cycle 1 AC today -Fulphila inj 10/30 (day 3) -Reviewed symptom management, possible side effects, reasons to call/report  -phone visit 11/4 -F/u with cycle 2 in 2 weeks   All  questions were answered. The patient knows to call the clinic with any problems, questions or concerns. No barriers to learning was detected. I spent 20 minutes counseling the patient face to face. The total time spent in the appointment was 25 minutes and more than 50% was on counseling and review of test results     Alla Feeling, NP 04/08/19

## 2019-04-08 ENCOUNTER — Other Ambulatory Visit: Payer: Self-pay

## 2019-04-08 ENCOUNTER — Inpatient Hospital Stay (HOSPITAL_BASED_OUTPATIENT_CLINIC_OR_DEPARTMENT_OTHER): Payer: PPO | Admitting: Nurse Practitioner

## 2019-04-08 ENCOUNTER — Inpatient Hospital Stay: Payer: PPO

## 2019-04-08 ENCOUNTER — Encounter: Payer: Self-pay | Admitting: Nurse Practitioner

## 2019-04-08 VITALS — BP 122/67 | HR 68 | Temp 98.3°F | Resp 20 | Ht 64.0 in | Wt 165.8 lb

## 2019-04-08 DIAGNOSIS — Z171 Estrogen receptor negative status [ER-]: Secondary | ICD-10-CM

## 2019-04-08 DIAGNOSIS — C50212 Malignant neoplasm of upper-inner quadrant of left female breast: Secondary | ICD-10-CM

## 2019-04-08 DIAGNOSIS — Z5111 Encounter for antineoplastic chemotherapy: Secondary | ICD-10-CM | POA: Diagnosis not present

## 2019-04-08 LAB — CMP (CANCER CENTER ONLY)
ALT: 14 U/L (ref 0–44)
AST: 15 U/L (ref 15–41)
Albumin: 3.9 g/dL (ref 3.5–5.0)
Alkaline Phosphatase: 67 U/L (ref 38–126)
Anion gap: 12 (ref 5–15)
BUN: 23 mg/dL (ref 8–23)
CO2: 25 mmol/L (ref 22–32)
Calcium: 9.4 mg/dL (ref 8.9–10.3)
Chloride: 105 mmol/L (ref 98–111)
Creatinine: 0.8 mg/dL (ref 0.44–1.00)
GFR, Est AFR Am: >60 mL/min (ref >60)
GFR, Estimated: >60 mL/min (ref >60)
Glucose, Bld: 112 mg/dL — ABNORMAL HIGH (ref 70–99)
Potassium: 4.1 mmol/L (ref 3.5–5.1)
Sodium: 142 mmol/L (ref 135–145)
Total Bilirubin: 0.3 mg/dL (ref 0.3–1.2)
Total Protein: 7.1 g/dL (ref 6.5–8.1)

## 2019-04-08 LAB — CBC WITH DIFFERENTIAL (CANCER CENTER ONLY)
Abs Immature Granulocytes: 0.01 10*3/uL (ref 0.00–0.07)
Basophils Absolute: 0 10*3/uL (ref 0.0–0.1)
Basophils Relative: 1 %
Eosinophils Absolute: 0.1 10*3/uL (ref 0.0–0.5)
Eosinophils Relative: 2 %
HCT: 38.7 % (ref 36.0–46.0)
Hemoglobin: 13 g/dL (ref 12.0–15.0)
Immature Granulocytes: 0 %
Lymphocytes Relative: 35 %
Lymphs Abs: 1.8 10*3/uL (ref 0.7–4.0)
MCH: 31 pg (ref 26.0–34.0)
MCHC: 33.6 g/dL (ref 30.0–36.0)
MCV: 92.1 fL (ref 80.0–100.0)
Monocytes Absolute: 0.5 10*3/uL (ref 0.1–1.0)
Monocytes Relative: 9 %
Neutro Abs: 2.9 10*3/uL (ref 1.7–7.7)
Neutrophils Relative %: 53 %
Platelet Count: 235 10*3/uL (ref 150–400)
RBC: 4.2 MIL/uL (ref 3.87–5.11)
RDW: 12.1 % (ref 11.5–15.5)
WBC Count: 5.3 10*3/uL (ref 4.0–10.5)
nRBC: 0 % (ref 0.0–0.2)

## 2019-04-08 MED ORDER — SODIUM CHLORIDE 0.9 % IV SOLN
600.0000 mg/m2 | Freq: Once | INTRAVENOUS | Status: AC
  Administered 2019-04-08: 12:00:00 1100 mg via INTRAVENOUS
  Filled 2019-04-08: qty 55

## 2019-04-08 MED ORDER — SODIUM CHLORIDE 0.9 % IV SOLN
Freq: Once | INTRAVENOUS | Status: AC
  Administered 2019-04-08: 10:00:00 via INTRAVENOUS
  Filled 2019-04-08: qty 250

## 2019-04-08 MED ORDER — PALONOSETRON HCL INJECTION 0.25 MG/5ML
0.2500 mg | Freq: Once | INTRAVENOUS | Status: AC
  Administered 2019-04-08: 10:00:00 0.25 mg via INTRAVENOUS

## 2019-04-08 MED ORDER — PALONOSETRON HCL INJECTION 0.25 MG/5ML
INTRAVENOUS | Status: AC
  Filled 2019-04-08: qty 5

## 2019-04-08 MED ORDER — SODIUM CHLORIDE 0.9% FLUSH
10.0000 mL | INTRAVENOUS | Status: DC | PRN
  Administered 2019-04-08: 12:00:00 10 mL
  Filled 2019-04-08: qty 10

## 2019-04-08 MED ORDER — HEPARIN SOD (PORK) LOCK FLUSH 100 UNIT/ML IV SOLN
500.0000 [IU] | Freq: Once | INTRAVENOUS | Status: AC | PRN
  Administered 2019-04-08: 12:00:00 500 [IU]
  Filled 2019-04-08: qty 5

## 2019-04-08 MED ORDER — SODIUM CHLORIDE 0.9 % IV SOLN
Freq: Once | INTRAVENOUS | Status: AC
  Administered 2019-04-08: 10:00:00 via INTRAVENOUS
  Filled 2019-04-08: qty 5

## 2019-04-08 MED ORDER — DOXORUBICIN HCL CHEMO IV INJECTION 2 MG/ML
60.0000 mg/m2 | Freq: Once | INTRAVENOUS | Status: AC
  Administered 2019-04-08: 11:00:00 110 mg via INTRAVENOUS
  Filled 2019-04-08: qty 55

## 2019-04-08 NOTE — Patient Instructions (Signed)

## 2019-04-08 NOTE — Patient Instructions (Signed)
[facility] Discharge Instructions for Patients Receiving Chemotherapy  Today you received the following chemotherapy agents: Doxorubicin (Adriamycin) and Cyclophosphamide (Cytoxan)  To help prevent nausea and vomiting after your treatment, we encourage you to take your nausea medication as directed. Received Aloxi during treatment today. For the next 3 days take your Compazine prescription (not your Zofran) as need for nausea. You can add Zofran back into your regimen starting Saturday if needed.   If you develop nausea and vomiting that is not controlled by your nausea medication, call the clinic.   BELOW ARE SYMPTOMS THAT SHOULD BE REPORTED IMMEDIATELY:  *FEVER GREATER THAN 100.5 F  *CHILLS WITH OR WITHOUT FEVER  NAUSEA AND VOMITING THAT IS NOT CONTROLLED WITH YOUR NAUSEA MEDICATION  *UNUSUAL SHORTNESS OF BREATH  *UNUSUAL BRUISING OR BLEEDING  TENDERNESS IN MOUTH AND THROAT WITH OR WITHOUT PRESENCE OF ULCERS  *URINARY PROBLEMS  *BOWEL PROBLEMS  UNUSUAL RASH Items with * indicate a potential emergency and should be followed up as soon as possible.  Feel free to call the clinic should you have any questions or concerns. The clinic phone number is [phone].  Please show the [facility] at check-in to the Emergency Department and triage nurse.  Doxorubicin injection What is this medicine? DOXORUBICIN (dox oh ROO bi sin) is a chemotherapy drug. It is used to treat many kinds of cancer like leukemia, lymphoma, neuroblastoma, sarcoma, and Wilms' tumor. It is also used to treat bladder cancer, breast cancer, lung cancer, ovarian cancer, stomach cancer, and thyroid cancer. This medicine may be used for other purposes; ask your health care provider or pharmacist if you have questions. COMMON BRAND NAME(S): Adriamycin, Adriamycin PFS, Adriamycin RDF, Rubex What should I tell my health care provider before I take this medicine? They need to know if you have any  of these conditions:  heart disease  history of low blood counts caused by a medicine  liver disease  recent or ongoing radiation therapy  an unusual or allergic reaction to doxorubicin, other chemotherapy agents, other medicines, foods, dyes, or preservatives  pregnant or trying to get pregnant  breast-feeding How should I use this medicine? This drug is given as an infusion into a vein. It is administered in a hospital or clinic by a specially trained health care professional. If you have pain, swelling, burning or any unusual feeling around the site of your injection, tell your health care professional right away. Talk to your pediatrician regarding the use of this medicine in children. Special care may be needed. Overdosage: If you think you have taken too much of this medicine contact a poison control center or emergency room at once. NOTE: This medicine is only for you. Do not share this medicine with others. What if I miss a dose? It is important not to miss your dose. Call your doctor or health care professional if you are unable to keep an appointment. What may interact with this medicine? This medicine may interact with the following medications:  6-mercaptopurine  paclitaxel  phenytoin  St. John's Wort  trastuzumab  verapamil This list may not describe all possible interactions. Give your health care provider a list of all the medicines, herbs, non-prescription drugs, or dietary supplements you use. Also tell them if you smoke, drink alcohol, or use illegal drugs. Some items may interact with your medicine. What should I watch for while using this medicine? This drug may make you feel generally unwell. This is not uncommon, as chemotherapy  can affect healthy cells as well as cancer cells. Report any side effects. Continue your course of treatment even though you feel ill unless your doctor tells you to stop. There is a maximum amount of this medicine you should  receive throughout your life. The amount depends on the medical condition being treated and your overall health. Your doctor will watch how much of this medicine you receive in your lifetime. Tell your doctor if you have taken this medicine before. You may need blood work done while you are taking this medicine. Your urine may turn red for a few days after your dose. This is not blood. If your urine is dark or brown, call your doctor. In some cases, you may be given additional medicines to help with side effects. Follow all directions for their use. Call your doctor or health care professional for advice if you get a fever, chills or sore throat, or other symptoms of a cold or flu. Do not treat yourself. This drug decreases your body's ability to fight infections. Try to avoid being around people who are sick. This medicine may increase your risk to bruise or bleed. Call your doctor or health care professional if you notice any unusual bleeding. Talk to your doctor about your risk of cancer. You may be more at risk for certain types of cancers if you take this medicine. Do not become pregnant while taking this medicine or for 6 months after stopping it. Women should inform their doctor if they wish to become pregnant or think they might be pregnant. Men should not father a child while taking this medicine and for 6 months after stopping it. There is a potential for serious side effects to an unborn child. Talk to your health care professional or pharmacist for more information. Do not breast-feed an infant while taking this medicine. This medicine has caused ovarian failure in some women and reduced sperm counts in some men This medicine may interfere with the ability to have a child. Talk with your doctor or health care professional if you are concerned about your fertility. This medicine may cause a decrease in Co-Enzyme Q-10. You should make sure that you get enough Co-Enzyme Q-10 while you are taking  this medicine. Discuss the foods you eat and the vitamins you take with your health care professional. What side effects may I notice from receiving this medicine? Side effects that you should report to your doctor or health care professional as soon as possible:  allergic reactions like skin rash, itching or hives, swelling of the face, lips, or tongue  breathing problems  chest pain  fast or irregular heartbeat  low blood counts - this medicine may decrease the number of white blood cells, red blood cells and platelets. You may be at increased risk for infections and bleeding.  pain, redness, or irritation at site where injected  signs of infection - fever or chills, cough, sore throat, pain or difficulty passing urine  signs of decreased platelets or bleeding - bruising, pinpoint red spots on the skin, black, tarry stools, blood in the urine  swelling of the ankles, feet, hands  tiredness  weakness Side effects that usually do not require medical attention (report to your doctor or health care professional if they continue or are bothersome):  diarrhea  hair loss  mouth sores  nail discoloration or damage  nausea  red colored urine  vomiting This list may not describe all possible side effects. Call your doctor for medical  advice about side effects. You may report side effects to FDA at 1-800-FDA-1088. Where should I keep my medicine? This drug is given in a hospital or clinic and will not be stored at home. NOTE: This sheet is a summary. It may not cover all possible information. If you have questions about this medicine, talk to your doctor, pharmacist, or health care provider.  2020 Elsevier/Gold Standard (2017-01-09 11:01:26)  Cyclophosphamide injection What is this medicine? CYCLOPHOSPHAMIDE (sye kloe FOSS fa mide) is a chemotherapy drug. It slows the growth of cancer cells. This medicine is used to treat many types of cancer like lymphoma, myeloma, leukemia,  breast cancer, and ovarian cancer, to name a few. This medicine may be used for other purposes; ask your health care provider or pharmacist if you have questions. COMMON BRAND NAME(S): Cytoxan, Neosar What should I tell my health care provider before I take this medicine? They need to know if you have any of these conditions:  blood disorders  history of other chemotherapy  infection  kidney disease  liver disease  recent or ongoing radiation therapy  tumors in the bone marrow  an unusual or allergic reaction to cyclophosphamide, other chemotherapy, other medicines, foods, dyes, or preservatives  pregnant or trying to get pregnant  breast-feeding How should I use this medicine? This drug is usually given as an injection into a vein or muscle or by infusion into a vein. It is administered in a hospital or clinic by a specially trained health care professional. Talk to your pediatrician regarding the use of this medicine in children. Special care may be needed. Overdosage: If you think you have taken too much of this medicine contact a poison control center or emergency room at once. NOTE: This medicine is only for you. Do not share this medicine with others. What if I miss a dose? It is important not to miss your dose. Call your doctor or health care professional if you are unable to keep an appointment. What may interact with this medicine? This medicine may interact with the following medications:  amiodarone  amphotericin B  azathioprine  certain antiviral medicines for HIV or AIDS such as protease inhibitors (e.g., indinavir, ritonavir) and zidovudine  certain blood pressure medications such as benazepril, captopril, enalapril, fosinopril, lisinopril, moexipril, monopril, perindopril, quinapril, ramipril, trandolapril  certain cancer medications such as anthracyclines (e.g., daunorubicin, doxorubicin), busulfan, cytarabine, paclitaxel, pentostatin, tamoxifen,  trastuzumab  certain diuretics such as chlorothiazide, chlorthalidone, hydrochlorothiazide, indapamide, metolazone  certain medicines that treat or prevent blood clots like warfarin  certain muscle relaxants such as succinylcholine  cyclosporine  etanercept  indomethacin  medicines to increase blood counts like filgrastim, pegfilgrastim, sargramostim  medicines used as general anesthesia  metronidazole  natalizumab This list may not describe all possible interactions. Give your health care provider a list of all the medicines, herbs, non-prescription drugs, or dietary supplements you use. Also tell them if you smoke, drink alcohol, or use illegal drugs. Some items may interact with your medicine. What should I watch for while using this medicine? Visit your doctor for checks on your progress. This drug may make you feel generally unwell. This is not uncommon, as chemotherapy can affect healthy cells as well as cancer cells. Report any side effects. Continue your course of treatment even though you feel ill unless your doctor tells you to stop. Drink water or other fluids as directed. Urinate often, even at night. In some cases, you may be given additional medicines to help with side  effects. Follow all directions for their use. Call your doctor or health care professional for advice if you get a fever, chills or sore throat, or other symptoms of a cold or flu. Do not treat yourself. This drug decreases your body's ability to fight infections. Try to avoid being around people who are sick. This medicine may increase your risk to bruise or bleed. Call your doctor or health care professional if you notice any unusual bleeding. Be careful brushing and flossing your teeth or using a toothpick because you may get an infection or bleed more easily. If you have any dental work done, tell your dentist you are receiving this medicine. You may get drowsy or dizzy. Do not drive, use machinery, or do  anything that needs mental alertness until you know how this medicine affects you. Do not become pregnant while taking this medicine or for 1 year after stopping it. Women should inform their doctor if they wish to become pregnant or think they might be pregnant. Men should not father a child while taking this medicine and for 4 months after stopping it. There is a potential for serious side effects to an unborn child. Talk to your health care professional or pharmacist for more information. Do not breast-feed an infant while taking this medicine. This medicine may interfere with the ability to have a child. This medicine has caused ovarian failure in some women. This medicine has caused reduced sperm counts in some men. You should talk with your doctor or health care professional if you are concerned about your fertility. If you are going to have surgery, tell your doctor or health care professional that you have taken this medicine. What side effects may I notice from receiving this medicine? Side effects that you should report to your doctor or health care professional as soon as possible:  allergic reactions like skin rash, itching or hives, swelling of the face, lips, or tongue  low blood counts - this medicine may decrease the number of white blood cells, red blood cells and platelets. You may be at increased risk for infections and bleeding.  signs of infection - fever or chills, cough, sore throat, pain or difficulty passing urine  signs of decreased platelets or bleeding - bruising, pinpoint red spots on the skin, black, tarry stools, blood in the urine  signs of decreased red blood cells - unusually weak or tired, fainting spells, lightheadedness  breathing problems  dark urine  dizziness  palpitations  swelling of the ankles, feet, hands  trouble passing urine or change in the amount of urine  weight gain  yellowing of the eyes or skin Side effects that usually do not  require medical attention (report to your doctor or health care professional if they continue or are bothersome):  changes in nail or skin color  hair loss  missed menstrual periods  mouth sores  nausea, vomiting This list may not describe all possible side effects. Call your doctor for medical advice about side effects. You may report side effects to FDA at 1-800-FDA-1088. Where should I keep my medicine? This drug is given in a hospital or clinic and will not be stored at home. NOTE: This sheet is a summary. It may not cover all possible information. If you have questions about this medicine, talk to your doctor, pharmacist, or health care provider.  2020 Elsevier/Gold Standard (2012-04-11 16:22:58)  Coronavirus (COVID-19) Are you at risk?  Are you at risk for the Coronavirus (COVID-19)?  To be  considered HIGH RISK for Coronavirus (COVID-19), you have to meet the following criteria:  . Traveled to [place], [place], [place], [place] or [place]; or in the [place] to [place], [place], [place], or [place]; and have fever, cough, and shortness of breath within the last 2 weeks of travel OR . Been in close contact with a person diagnosed with COVID-19 within the last 2 weeks and have fever, cough, and shortness of breath . IF YOU DO NOT MEET THESE CRITERIA, YOU ARE CONSIDERED LOW RISK FOR COVID-19.  What to do if you are HIGH RISK for COVID-19?  Marland Kitchen If you are having a medical emergency, call 911. . Seek medical care right away. Before you go to a doctor's office, urgent care or emergency department, call ahead and tell them about your recent travel, contact with someone diagnosed with COVID-19, and your symptoms. You should receive instructions from your physician's office regarding next steps of care.  . When you arrive at healthcare provider, tell the healthcare staff immediately you have returned from visiting [place], [place], [place], [place] or [place]; or traveled in the  [place] to [place], [place], [place], or [place]; in the last two weeks or you have been in close contact with a person diagnosed with COVID-19 in the last 2 weeks.   . Tell the health care staff about your symptoms: fever, cough and shortness of breath. . After you have been seen by a medical provider, you will be either: o Tested for (COVID-19) and discharged home on quarantine except to seek medical care if symptoms worsen, and asked to  - Stay home and avoid contact with others until you get your results (4-5 days)  - Avoid travel on public transportation if possible (such as bus, train, or airplane) or o Sent to the Emergency Department by EMS for evaluation, COVID-19 testing, and possible admission depending on your condition and test results.  What to do if you are LOW RISK for COVID-19?  Reduce your risk of any infection by using the same precautions used for avoiding the common cold or flu:  Marland Kitchen Wash your hands often with soap and warm water for at least 20 seconds.  If soap and water are not readily available, use an alcohol-based hand sanitizer with at least 60% alcohol.  . If coughing or sneezing, cover your mouth and nose by coughing or sneezing into the elbow areas of your shirt or coat, into a tissue or into your sleeve (not your hands). . Avoid shaking hands with others and consider head nods or verbal greetings only. . Avoid touching your eyes, nose, or mouth with unwashed hands.  . Avoid close contact with people who are sick. . Avoid places or events with large numbers of people in one location, like concerts or sporting events. . Carefully consider travel plans you have or are making. . If you are planning any travel outside or inside the [place], visit the CDC's Travelers' Health webpage for the latest health notices. . If you have some symptoms but not all symptoms, continue to monitor at home and seek medical attention if your symptoms worsen. . If you are having  a medical emergency, call 911.   [facility] / e-Visit: [email or web address]         [facility]: [facility]: [phone]  [facility]: [phone]

## 2019-04-09 ENCOUNTER — Telehealth: Payer: Self-pay | Admitting: *Deleted

## 2019-04-10 ENCOUNTER — Inpatient Hospital Stay: Payer: PPO

## 2019-04-10 ENCOUNTER — Other Ambulatory Visit: Payer: Self-pay | Admitting: Medical

## 2019-04-10 ENCOUNTER — Telehealth: Payer: Self-pay | Admitting: *Deleted

## 2019-04-10 ENCOUNTER — Other Ambulatory Visit: Payer: Self-pay

## 2019-04-10 ENCOUNTER — Other Ambulatory Visit: Payer: Self-pay | Admitting: Hematology

## 2019-04-10 VITALS — BP 111/47 | HR 78 | Temp 97.5°F | Resp 18

## 2019-04-10 DIAGNOSIS — Z5111 Encounter for antineoplastic chemotherapy: Secondary | ICD-10-CM | POA: Diagnosis not present

## 2019-04-10 DIAGNOSIS — C50212 Malignant neoplasm of upper-inner quadrant of left female breast: Secondary | ICD-10-CM

## 2019-04-10 DIAGNOSIS — Z171 Estrogen receptor negative status [ER-]: Secondary | ICD-10-CM

## 2019-04-10 MED ORDER — PEGFILGRASTIM-JMDB 6 MG/0.6ML ~~LOC~~ SOSY: 6.0000 mg | PREFILLED_SYRINGE | Freq: Once | SUBCUTANEOUS | Status: DC

## 2019-04-10 MED ORDER — PEGFILGRASTIM-JMDB 6 MG/0.6ML ~~LOC~~ SOSY
6.0000 mg | PREFILLED_SYRINGE | Freq: Once | SUBCUTANEOUS | Status: AC
  Administered 2019-04-10: 14:00:00 6 mg via SUBCUTANEOUS

## 2019-04-10 NOTE — Telephone Encounter (Signed)
Called pt to assess for needs and questions after 1st chemo. Relate doing ok, just feels fatigued. No more n/v. Able to tolerate fluids and food. Denies questions or needs at this time.

## 2019-04-10 NOTE — Patient Instructions (Signed)

## 2019-04-14 NOTE — Progress Notes (Signed)
[place]   [phone] [phone]) [phone]   Clinic Follow up Note   Patient Care Team: Mosie Lukes, MD as PCP - General (Family Medicine) Cherylann Banas, Cherly Anderson, MD (Inactive) (Obstetrics and Gynecology) Rockwell Germany, RN as Oncology Nurse Navigator Mauro Kaufmann, RN as Oncology Nurse Navigator Erroll Luna, MD as Consulting Physician (General Surgery) Truitt Merle, MD as Consulting Physician (Hematology) Gery Pray, MD as Consulting Physician (Radiation Oncology) 04/15/2019  I connected with Cindy [name] on 04/15/19 at 12:10 PM EDT by telephone visit and verified that I am speaking with the correct person using two identifiers.   I discussed the limitations, risks, security and privacy concerns of performing an evaluation and management service by telemedicine and the availability of in-person appointments. I also discussed with the patient that there may be a patient responsible charge related to this service. The patient expressed understanding and agreed to proceed.   Other persons participating in the visit and their role in the encounter: None   Patient's location: Home Provider's location: [facility] office  Chief Complaint: f/u left breast cancer, toxicity check    SUMMARY OF ONCOLOGIC HISTORY: Oncology History Overview Note  Cancer Staging Malignant neoplasm of upper-inner quadrant of left breast in female, estrogen receptor negative ([place]) Staging form: Breast, AJCC 8th Edition - Clinical stage from 02/06/2019: Stage IB (cT1c, cN0, cM0, G2, ER-, PR-, HER2-) - Signed by Truitt Merle, MD on 02/17/2019 - Pathologic stage from 03/10/2019: Stage IIA (pT2, pN0, cM0, G3, ER-, PR-, HER2-) - Signed by Truitt Merle, MD on 03/12/2019    Malignant neoplasm of upper-inner quadrant of left breast in female, estrogen receptor negative ([place])  02/04/2019 Mammogram   Diagnostic mammogram 02/04/19  IMPRESSION: Highly suspicious 1.7 x1.5 x1.7cm at 10:00 position  of UPPER INNER LEFT breast mass 10 cm from nipple corresponding to the screening study finding. Tissue sampling recommended.   No abnormal LEFT axillary lymph nodes.   02/06/2019 Cancer Staging   Staging form: Breast, AJCC 8th Edition - Clinical stage from 02/06/2019: Stage IB (cT1c, cN0, cM0, G2, ER-, PR-, HER2-) - Signed by Truitt Merle, MD on 02/17/2019   02/06/2019 Initial Biopsy   Diagnosis 02/06/19 Breast, left, needle core biopsy, 10 o'clock, 10cm/n, ribbon clip - INVASIVE MAMMARY CARCINOMA.   02/06/2019 Receptors her2   Results: IMMUNOHISTOCHEMICAL AND MORPHOMETRIC ANALYSIS PERFORMED MANUALLY The tumor cells are NEGATIVE for Her2 (1+). Estrogen Receptor: 0%, NEGATIVE Progesterone Receptor: 0%, NEGATIVE Proliferation Marker Ki67: 40%   02/17/2019 Initial Diagnosis   Malignant neoplasm of upper-inner quadrant of left breast in female, estrogen receptor negative ([place])   03/10/2019 Cancer Staging   Staging form: Breast, AJCC 8th Edition - Pathologic stage from 03/10/2019: Stage IIA (pT2, pN0, cM0, G3, ER-, PR-, HER2-) - Signed by Truitt Merle, MD on 03/12/2019   03/10/2019 Surgery   LEFT BREAST LUMPECTOMY WITH RADIOACTIVE SEED AND LEFT SENTINEL LYMPH NODE MAPPING and PAC placement  by Dr. Brantley Stage  03/10/19   03/10/2019 Pathology Results   DIAGNOSIS: 03/10/19  A. BREAST, LEFT, LUMPECTOMY:  - Invasive ductal carcinoma, grade 3, 2.5 cm  - Carcinoma is 0.3 cm from superior margin  - Medial resection edge is 0.1 cm from carcinoma (final medial margin is  represented by part 2 which is negative for carcinoma)  - Negative for lymphovascular or perineural invasion  - Biopsy site changes  - See oncology table   B. BREAST, LEFT MEDIAL MARGIN, EXCISION:  - Benign breast parenchyma, negative for carcinoma  C. LYMPH NODE, LEFT AXILLARY, SENTINEL BIOPSY:  - Lymph node, negative for carcinoma (0/1)     Chemotherapy   PENDING chemo AC q2weeks for 4 cycles starting 04/08/19 followed by Taxol for  2-3 months.     04/06/2019 Imaging   Bone scan IMPRESSION: Mild focus of uptake is seen involving the anterior portion of left upper rib, but there is no corresponding abnormality seen on the CT scan of the same day. Potentially this may represent degenerative change or secondary to overlying postsurgical change. Attention on follow-up is recommended.   No definite scintigraphic evidence of osseous metastases.   04/06/2019 Imaging   CT CAP IMPRESSION: 1. Surgical changes left breast with borderline enlarged left axillary lymph node. No other definite evidence for metastatic disease in the chest, abdomen, or pelvis. 2. Tiny bilateral pulmonary nodules. Attention on follow-up recommended. 3. Scattered small hypoattenuating lesions in the liver parenchyma. Some of these have attenuation higher than would be expected for simple fluid and may be cysts complicated by proteinaceous debris or hemorrhage. MRI without and with contrast recommended to further evaluate. 4. Small to moderate hiatal hernia. 5. Aortic Atherosclerosis (ICD10-I70.0).   04/08/2019 -  Chemotherapy   The patient had DOXOrubicin (ADRIAMYCIN) chemo injection 110 mg, 60 mg/m2 = 110 mg, Intravenous,  Once, 1 of 4 cycles Administration: 110 mg (04/08/2019) palonosetron (ALOXI) injection 0.25 mg, 0.25 mg, Intravenous,  Once, 1 of 4 cycles Administration: 0.25 mg (04/08/2019) pegfilgrastim-jmdb (FULPHILA) injection 6 mg, 6 mg, Subcutaneous,  Once, 1 of 8 cycles Administration: 6 mg (04/10/2019) cyclophosphamide (CYTOXAN) 1,100 mg in sodium chloride 0.9 % 250 mL chemo infusion, 600 mg/m2 = 1,100 mg, Intravenous,  Once, 1 of 4 cycles Administration: 1,100 mg (04/08/2019) PACLitaxel (TAXOL) 318 mg in sodium chloride 0.9 % 500 mL chemo infusion (> 37m/m2), 175 mg/m2, Intravenous,  Once, 0 of 4 cycles fosaprepitant (EMEND) 150 mg, dexamethasone (DECADRON) 12 mg in sodium chloride 0.9 % 145 mL IVPB, , Intravenous,  Once, 1 of  4 cycles Administration:  (04/08/2019)  for chemotherapy treatment.      CURRENT THERAPY: Adjuvant chemo AC q2weeks for 4 cycles starting 04/08/19 followed by Taxol for 2-3 months.  INTERVAL HISTORY: Ms. MKleckerpresents for phone visit to monitor her side effects from chemotherapy. Treatment itself went well, she felt fine until 1 hour after eating dinner and taking compazine she had nausea with vomiting that lasted almost 10 hours until 4 am. She continued taking compazine q6h and that helped. On day 3 she started zofran BID. Still has mild "waves of nausea" but no vomiting. Her appetite was low for a few days, improved now and eating/drinking well, gets full quicker. Denies mucositis. Has constipation at baseline, with small BM daily. Denies dysuria. Denies bleeding. No fever, chills, chest pain, dyspnea. He has cough and throat clearing she attributes to reflux. This was managed with tums and improved when she was exercising more, now flaring again since she is less active. Tums still help. [facility]. Denies bone pain after neulasta. Her main complaints are mild fatigue and brain fog. She remains able to function and do things. She had an episode on 11/1 where she had palpitations and lightheadedness after shower while getting dressed. This resolved with rest. She has had "flutter" before, sees Dr. BHaroldine Laws Has not had recurrent episodes. She does not recall if she was dehydrated, hungry, or had changed position quickly before the symptom onset.   MEDICAL HISTORY:  Past Medical History:  Diagnosis Date  . Allergy   . Depression    ? if bipolar works the best  . Elevated fasting blood sugar    hx  . H/O measles   . Headache   . History of chicken pox   . Hx of migraines   . Microscopic hematuria    advised to see urology ? never went  . Preventative health care 06/20/2011    SURGICAL HISTORY: Past Surgical History:  Procedure  Laterality Date  . BREAST LUMPECTOMY WITH RADIOACTIVE SEED AND SENTINEL LYMPH NODE BIOPSY Left 03/10/2019   Procedure: LEFT BREAST LUMPECTOMY WITH RADIOACTIVE SEED AND LEFT SENTINEL LYMPH NODE MAPPING;  Surgeon: Erroll Luna, MD;  Location: [name];  Service: General;  Laterality: Left;  . endometrial polyps     2 surgeries by Dr Cathlean Cower  . EYE SURGERY  04/29/2009   Rt eye tighten up  . PORTACATH PLACEMENT N/A 03/10/2019   Procedure: INSERTION PORT-A-CATH WITH ULTRASOUND;  Surgeon: Erroll Luna, MD;  Location: [place];  Service: General;  Laterality: N/A;  . steel plate in skull Left    from a crushed skull    I have reviewed the social history and family history with the patient and they are unchanged from previous note.  ALLERGIES:  is allergic to bromfed and venlafaxine.  MEDICATIONS:  Current Outpatient Medications  Medication Sig Dispense Refill  . acidophilus (RISAQUAD) CAPS capsule Take 1 capsule by mouth daily.    Marland Kitchen FLUoxetine (PROZAC) 40 MG capsule Take 1 capsule (40 mg total) by mouth daily. 90 capsule 0  . lidocaine-prilocaine (EMLA) cream Apply to affected area once 30 g 3  . Multiple Vitamins-Minerals (MULTIVITAMIN WITH MINERALS) tablet Take 1 tablet by mouth daily.    . ondansetron (ZOFRAN) 8 MG tablet Take 1 tablet (8 mg total) by mouth 2 (two) times daily as needed. Start on the third day after chemotherapy. (Patient not taking: Reported on 04/08/2019) 30 tablet 1  . prochlorperazine (COMPAZINE) 10 MG tablet Take 1 tablet (10 mg total) by mouth every 6 (six) hours as needed (Nausea or vomiting). (Patient not taking: Reported on 04/08/2019) 30 tablet 1   No current facility-administered medications for this visit.     PHYSICAL EXAMINATION:  There were no vitals filed for this visit.  Patient appears well over the phone. Speech is intact, non-pressured. Mood appears normal for situation.  LABORATORY DATA:  I have reviewed the data as listed CBC Latest Ref Rng &  Units 04/08/2019 03/05/2019 02/18/2019  WBC 4.0 - 10.5 K/uL 5.3 5.6 6.4  Hemoglobin 12.0 - 15.0 g/dL 13.0 13.9 14.4  Hematocrit 36.0 - 46.0 % 38.7 42.2 42.4  Platelets 150 - 400 K/uL 235 265 281     CMP Latest Ref Rng & Units 04/08/2019 03/05/2019 02/18/2019  Glucose 70 - 99 mg/dL 112(H) 109(H) 88  BUN 8 - 23 mg/dL 23 14 19   Creatinine 0.44 - 1.00 mg/dL 0.80 0.82 0.98  Sodium 135 - 145 mmol/L 142 139 140  Potassium 3.5 - 5.1 mmol/L 4.1 4.5 4.4  Chloride 98 - 111 mmol/L 105 104 102  CO2 22 - 32 mmol/L 25 27 29   Calcium 8.9 - 10.3 mg/dL 9.4 9.5 9.7  Total Protein 6.5 - 8.1 g/dL 7.1 6.9 7.6  Total Bilirubin 0.3 - 1.2 mg/dL 0.3 0.4 0.4  Alkaline Phos 38 - 126 U/L 67 65 65  AST 15 - 41 U/L 15 18 15   ALT 0 - 44 U/L 14  18 14      RADIOGRAPHIC STUDIES: I have personally reviewed the radiological images as listed and agreed with the findings in the report. No results found.   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 She underwent left breast lumpectomy with SLNB on 03/10/19.  -pathology report which showsgrade III 2.5cm tumorwhichwas completely removed with clear margins, node negative. She had PAC placed during surgery.  -She underwent staging work-up on 10/26; bone scan shows mild uptake in the left upper rib with no CT correlate, otherwise negative for osseous metastasis.  She reports she fell on her left side/rib in July.  -CT CAP shows surgical changes in the left breast with borderline enlarged left axillary lymph node, possibly related to recent surgery.  Plan to repeat ultrasound in 1 month, if the lymph node remains enlarged she may require additional biopsy.  Other findings include tiny bilateral pulmonary nodules, and scattered small hypoattenuating lesions in the liver parenchyma, some of which have  attenuation higher than would be expected for simple fluid and may be cysts complicated by proteinaceous debris or hemorrhage.  MRI with/without contrast is recommended, although the patient has metal plate in her head and cannot undergo MRI.  Plan to monitor on CT in the future.  Otherwise, no definite evidence of metastatic disease. - baseline echo which shows EF 50-55%, she was seen by Dr. Haroldine Laws who feels this was misinterpreted and in fact has a normal EF, no evidence of heart failure. Plan to repeat echo after cycle 3 AC -She began adjuvant chemo with AC and Fulphila on 11/28, she had n/v the night of chemo that was well managed with compazine and zofran. Able to maintain adequate po intake and hydration. She has mild fatigue and brain fog, but otherwise recovered well.  -she had an episode of palpitation and lightheadedness after the shower, resolved with rest. Unclear if anemia, postural hypotension or hydration played a role. Her baseline echo was normal. The episode has not recurred, I reviewed that we could bring her in for labs to check CBC and determine whether she needs RBC transfusion or check her BP for orthostatic hypotension.  -She prefers to monitor at home. She will call if she has further concerns or recurrence. I encouraged her to eat small frequent amounts and stay hydrated.  -we reviewed symptom management with anti-emetics. She does not want to add ativan for now, n/v is adequately managed with compazine and zofran. Will monitor.  -she knows to call if she has fever, chills, productive cough, signs of infection, or new concerns.  -she will return for f/u and cycle 2 on 11/11.   2. CINV -worse the first night of treatment, then improved over next few days -managed with compazine and zofran, declined ativan for now -remains able to eat and drink at home   3. Multiple Skin lesionsin chest -She has multiple moles and skin lesions of her b/l breasts and chest. The largest  skin lesion on right breast measuring 4x5cm.  -patient reports seeing a dermatologist who says her skin lesions are benign. Will call [facility] to get path report   4. Genetic Testing  -Her mother had colon cancer and father had bladder cancer, her MGF had cancer and her maternal great grandmother had breast cancer.  -Will check with Genetics to see if she is eligible for testing. She is interested  in insurance covers it.  PLAN:  -Ms. Travieso appears stable -Reviewed symptom management -obtain path from [place] -check with Roma Kayser re: genetic testing  -Lab, f/u with Dr. Burr Medico, and cycle 2 [facility] on 11/11   All questions were answered. The patient knows to call the clinic with any problems, questions or concerns. No barriers to learning was detected. I spent 18 minutes non-face-to face time on today's call, more than 50% was on counseling and coordination of care.     Alla Feeling, NP 04/15/19

## 2019-04-15 ENCOUNTER — Inpatient Hospital Stay: Payer: PPO | Attending: Hematology | Admitting: Nurse Practitioner

## 2019-04-15 ENCOUNTER — Encounter: Payer: Self-pay | Admitting: Nurse Practitioner

## 2019-04-15 ENCOUNTER — Telehealth: Payer: Self-pay

## 2019-04-15 DIAGNOSIS — Z8052 Family history of malignant neoplasm of bladder: Secondary | ICD-10-CM | POA: Insufficient documentation

## 2019-04-15 DIAGNOSIS — R918 Other nonspecific abnormal finding of lung field: Secondary | ICD-10-CM | POA: Insufficient documentation

## 2019-04-15 DIAGNOSIS — K219 Gastro-esophageal reflux disease without esophagitis: Secondary | ICD-10-CM | POA: Insufficient documentation

## 2019-04-15 DIAGNOSIS — R112 Nausea with vomiting, unspecified: Secondary | ICD-10-CM | POA: Insufficient documentation

## 2019-04-15 DIAGNOSIS — Z79899 Other long term (current) drug therapy: Secondary | ICD-10-CM | POA: Insufficient documentation

## 2019-04-15 DIAGNOSIS — Z171 Estrogen receptor negative status [ER-]: Secondary | ICD-10-CM | POA: Insufficient documentation

## 2019-04-15 DIAGNOSIS — C50212 Malignant neoplasm of upper-inner quadrant of left female breast: Secondary | ICD-10-CM | POA: Insufficient documentation

## 2019-04-15 DIAGNOSIS — Z800 Family history of malignant neoplasm of digestive organs: Secondary | ICD-10-CM | POA: Insufficient documentation

## 2019-04-15 DIAGNOSIS — Z5111 Encounter for antineoplastic chemotherapy: Secondary | ICD-10-CM | POA: Insufficient documentation

## 2019-04-15 DIAGNOSIS — Z5189 Encounter for other specified aftercare: Secondary | ICD-10-CM | POA: Insufficient documentation

## 2019-04-15 DIAGNOSIS — F329 Major depressive disorder, single episode, unspecified: Secondary | ICD-10-CM | POA: Insufficient documentation

## 2019-04-15 DIAGNOSIS — L989 Disorder of the skin and subcutaneous tissue, unspecified: Secondary | ICD-10-CM | POA: Insufficient documentation

## 2019-04-15 DIAGNOSIS — D6481 Anemia due to antineoplastic chemotherapy: Secondary | ICD-10-CM | POA: Insufficient documentation

## 2019-04-15 DIAGNOSIS — Z803 Family history of malignant neoplasm of breast: Secondary | ICD-10-CM | POA: Insufficient documentation

## 2019-04-15 NOTE — Telephone Encounter (Signed)
Left vm for [facility] requesting path report to faxed to cancer center and a call back.

## 2019-04-15 NOTE — Telephone Encounter (Signed)
-----   Message from Alla Feeling, NP sent at 04/15/2019  2:17 PM EST ----- Can you please call  [facility] to get her path report.  Thanks, Regan Rakers

## 2019-04-16 ENCOUNTER — Telehealth: Payer: Self-pay | Admitting: Nurse Practitioner

## 2019-04-16 NOTE — Telephone Encounter (Signed)
No los per 11/4 °

## 2019-04-17 NOTE — Progress Notes (Signed)
[place]   [phone] [phone]   Clinic Follow up Note   Patient Care Team: Mosie Lukes, MD as PCP - General (Family Medicine) Cherylann Banas, Cherly Anderson, MD (Inactive) (Obstetrics and Gynecology) Rockwell Germany, RN as Oncology Nurse Navigator Mauro Kaufmann, RN as Oncology Nurse Navigator Erroll Luna, MD as Consulting Physician (General Surgery) Truitt Merle, MD as Consulting Physician (Hematology) Gery Pray, MD as Consulting Physician (Radiation Oncology)  Date of Service:  04/22/2019  CHIEF COMPLAINT:  F/u of left breast cancer  SUMMARY OF ONCOLOGIC HISTORY: Oncology History Overview Note  Cancer Staging Malignant neoplasm of upper-inner quadrant of left breast in female, estrogen receptor negative ([place]) Staging form: Breast, AJCC 8th Edition - Clinical stage from 02/06/2019: Stage IB (cT1c, cN0, cM0, G2, ER-, PR-, HER2-) - Signed by Truitt Merle, MD on 02/17/2019 - Pathologic stage from 03/10/2019: Stage IIA (pT2, pN0, cM0, G3, ER-, PR-, HER2-) - Signed by Truitt Merle, MD on 03/12/2019    Malignant neoplasm of upper-inner quadrant of left breast in female, estrogen receptor negative ([place])  02/04/2019 Mammogram   Diagnostic mammogram 02/04/19  IMPRESSION: Highly suspicious 1.7 x1.5 x1.7cm at 10:00 position of UPPER INNER LEFT breast mass 10 cm from nipple corresponding to the screening study finding. Tissue sampling recommended.   No abnormal LEFT axillary lymph nodes.   02/06/2019 Cancer Staging   Staging form: Breast, AJCC 8th Edition - Clinical stage from 02/06/2019: Stage IB (cT1c, cN0, cM0, G2, ER-, PR-, HER2-) - Signed by Truitt Merle, MD on 02/17/2019   02/06/2019 Initial Biopsy   Diagnosis 02/06/19 Breast, left, needle core biopsy, 10 o'clock, 10cm/n, ribbon clip - INVASIVE MAMMARY CARCINOMA.   02/06/2019 Receptors her2   Results: IMMUNOHISTOCHEMICAL AND MORPHOMETRIC ANALYSIS PERFORMED MANUALLY The tumor cells are NEGATIVE for Her2  (1+). Estrogen Receptor: 0%, NEGATIVE Progesterone Receptor: 0%, NEGATIVE Proliferation Marker Ki67: 40%   02/17/2019 Initial Diagnosis   Malignant neoplasm of upper-inner quadrant of left breast in female, estrogen receptor negative ([place])   03/10/2019 Cancer Staging   Staging form: Breast, AJCC 8th Edition - Pathologic stage from 03/10/2019: Stage IIA (pT2, pN0, cM0, G3, ER-, PR-, HER2-) - Signed by Truitt Merle, MD on 03/12/2019   03/10/2019 Surgery   LEFT BREAST LUMPECTOMY WITH RADIOACTIVE SEED AND LEFT SENTINEL LYMPH NODE MAPPING and PAC placement  by Dr. Brantley Stage  03/10/19   03/10/2019 Pathology Results   DIAGNOSIS: 03/10/19  A. BREAST, LEFT, LUMPECTOMY:  - Invasive ductal carcinoma, grade 3, 2.5 cm  - Carcinoma is 0.3 cm from superior margin  - Medial resection edge is 0.1 cm from carcinoma (final medial margin is  represented by part 2 which is negative for carcinoma)  - Negative for lymphovascular or perineural invasion  - Biopsy site changes  - See oncology table   B. BREAST, LEFT MEDIAL MARGIN, EXCISION:  - Benign breast parenchyma, negative for carcinoma   C. LYMPH NODE, LEFT AXILLARY, SENTINEL BIOPSY:  - Lymph node, negative for carcinoma (0/1)    04/06/2019 Imaging   Bone scan IMPRESSION: Mild focus of uptake is seen involving the anterior portion of left upper rib, but there is no corresponding abnormality seen on the CT scan of the same day. Potentially this may represent degenerative change or secondary to overlying postsurgical change. Attention on follow-up is recommended.   No definite scintigraphic evidence of osseous metastases.   04/06/2019 Imaging   CT CAP IMPRESSION: 1. Surgical changes left breast with borderline enlarged left axillary lymph node.  No other definite evidence for metastatic disease in the chest, abdomen, or pelvis. 2. Tiny bilateral pulmonary nodules. Attention on follow-up recommended. 3. Scattered small hypoattenuating lesions in the  liver parenchyma. Some of these have attenuation higher than would be expected for simple fluid and may be cysts complicated by proteinaceous debris or hemorrhage. MRI without and with contrast recommended to further evaluate. 4. Small to moderate hiatal hernia. 5. Aortic Atherosclerosis (ICD10-I70.0).   04/08/2019 -  Chemotherapy   Chemo AC q2weeks for 4 cycles starting 04/08/19 followed by weekly Taxol for 2-3 months.        CURRENT THERAPY:  chemo AC q2weeks for 4 cycles starting 04/08/19 followed by Taxol for 2-3 months  INTERVAL HISTORY:  Cindy [name] is here for a follow up and treatment. She presents to the clinic alone. She notes she tolerated her first infusion very well. She notes she had N&V intermittently for the first 5-8 days. She notes she attributes N&V to chemo and her hiatal hernia. She notes Tums and compazine and then added Zofran which worked for her. She otherwise was able to do her usual activities. She notes she has been eating adequately with more frequent meals. She notes her hair feels thinner but not much loss. She notes her scalp has felt tender. She notes trouble with getting growth injection orders signed at other clinic.  She notes she has nagging cough which she attributes to her hiatal hernia and acid reflux.    REVIEW OF SYSTEMS:   Constitutional: Denies fevers, chills or abnormal weight loss (+) fatigue Eyes: Denies blurriness of vision Ears, nose, mouth, throat, and face: Denies mucositis or sore throat Respiratory: Denies cough, dyspnea or wheezes Cardiovascular: Denies palpitation, chest discomfort or lower extremity swelling Gastrointestinal:  Denies heartburn or change in bowel habits (+) Nausea and Vomiting (+) Acid reflux with cough Skin: Denies abnormal skin rashes (+) Hair thinning and scalp tenderness Lymphatics: Denies new lymphadenopathy or easy bruising Neurological:Denies numbness, tingling or new weaknesses Behavioral/Psych: Mood  is stable, no new changes  All other systems were reviewed with the patient and are negative.  MEDICAL HISTORY:  Past Medical History:  Diagnosis Date  . Allergy   . Depression    ? if bipolar works the best  . Elevated fasting blood sugar    hx  . H/O measles   . Headache   . History of chicken pox   . Hx of migraines   . Microscopic hematuria    advised to see urology ? never went  . Preventative health care 06/20/2011    SURGICAL HISTORY: Past Surgical History:  Procedure Laterality Date  . BREAST LUMPECTOMY WITH RADIOACTIVE SEED AND SENTINEL LYMPH NODE BIOPSY Left 03/10/2019   Procedure: LEFT BREAST LUMPECTOMY WITH RADIOACTIVE SEED AND LEFT SENTINEL LYMPH NODE MAPPING;  Surgeon: Erroll Luna, MD;  Location: [facility];  Service: General;  Laterality: Left;  . endometrial polyps     2 surgeries by Dr Cathlean Cower  . EYE SURGERY  04/29/2009   Rt eye tighten up  . PORTACATH PLACEMENT N/A 03/10/2019   Procedure: INSERTION PORT-A-CATH WITH ULTRASOUND;  Surgeon: Erroll Luna, MD;  Location: [facility];  Service: General;  Laterality: N/A;  . steel plate in skull Left    from a crushed skull    I have reviewed the social history and family history with the patient and they are unchanged from previous note.  ALLERGIES:  is allergic to bromfed and venlafaxine.  MEDICATIONS:  Current Outpatient  Medications  Medication Sig Dispense Refill  . acidophilus (RISAQUAD) CAPS capsule Take 1 capsule by mouth daily.    Marland Kitchen FLUoxetine (PROZAC) 40 MG capsule Take 1 capsule (40 mg total) by mouth daily. 90 capsule 0  . lidocaine-prilocaine (EMLA) cream Apply to affected area once 30 g 3  . Multiple Vitamins-Minerals (MULTIVITAMIN WITH MINERALS) tablet Take 1 tablet by mouth daily.    . ondansetron (ZOFRAN) 8 MG tablet Take 1 tablet (8 mg total) by mouth 2 (two) times daily as needed. Start on the third day after chemotherapy. 30 tablet 1  . prochlorperazine (COMPAZINE) 10 MG tablet Take 1 tablet (10  mg total) by mouth every 6 (six) hours as needed (Nausea or vomiting). 30 tablet 1   No current facility-administered medications for this visit.     PHYSICAL EXAMINATION: ECOG PERFORMANCE STATUS: 1 - Symptomatic but completely ambulatory  Vitals:   04/22/19 1038  BP: 130/62  Pulse: 86  Resp: 18  Temp: 97.8 F (36.6 C)  SpO2: 97%   Filed Weights   04/22/19 1038  Weight: 161 lb 14.4 oz (73.4 kg)    GENERAL:alert, no distress and comfortable SKIN: skin color, texture, turgor are normal, no rashes or significant lesions EYES: normal, Conjunctiva are pink and non-injected, sclera clear  NECK: supple, thyroid normal size, non-tender, without nodularity LYMPH:  no palpable lymphadenopathy in the cervical, axillary  LUNGS: clear to auscultation and percussion with normal breathing effort HEART: regular rate & rhythm and no murmurs and no lower extremity edema ABDOMEN:abdomen soft, non-tender and normal bowel sounds Musculoskeletal:no cyanosis of digits and no clubbing  NEURO: alert & oriented x 3 with fluent speech, no focal motor/sensory deficits  LABORATORY DATA:  I have reviewed the data as listed CBC Latest Ref Rng & Units 04/22/2019 04/08/2019 03/05/2019  WBC 4.0 - 10.5 K/uL 9.0 5.3 5.6  Hemoglobin 12.0 - 15.0 g/dL 11.6(L) 13.0 13.9  Hematocrit 36.0 - 46.0 % 35.0(L) 38.7 42.2  Platelets 150 - 400 K/uL 282 235 265     CMP Latest Ref Rng & Units 04/22/2019 04/08/2019 03/05/2019  Glucose 70 - 99 mg/dL 107(H) 112(H) 109(H)  BUN 8 - 23 mg/dL 19 23 14   Creatinine 0.44 - 1.00 mg/dL 0.87 0.80 0.82  Sodium 135 - 145 mmol/L 139 142 139  Potassium 3.5 - 5.1 mmol/L 4.2 4.1 4.5  Chloride 98 - 111 mmol/L 104 105 104  CO2 22 - 32 mmol/L 26 25 27   Calcium 8.9 - 10.3 mg/dL 9.1 9.4 9.5  Total Protein 6.5 - 8.1 g/dL 6.9 7.1 6.9  Total Bilirubin 0.3 - 1.2 mg/dL <0.2(L) 0.3 0.4  Alkaline Phos 38 - 126 U/L 76 67 65  AST 15 - 41 U/L 15 15 18   ALT 0 - 44 U/L 18 14 18        RADIOGRAPHIC STUDIES: I have personally reviewed the radiological images as listed and agreed with the findings in the report. No results found.   ASSESSMENT & PLAN:  FELICIA BLOOMQUIST is a 68 y.o. female with   1.Malignant neoplasm of upper-inner quadrant of left breast, StageIIA, p(T2N0M0), ER/PR/HER2-,triple negative,GradeIII -She was diagnosed in 02/2019. She underwent left breast lumpectomy with SLNB on 03/10/19.  -Given her aggressive triple negative breast cancer, adjuvant chemo was recommended to reduce her risk of recurrence. I started her on dose dense Adriamycin and Cytoxan q2weeks for 4 cycles followed by Taxol weekly for 12 weeks or every 2 weeks for 4 cycles on 04/08/19. -She tolerated her  first cycle AC mostly well with mild hair thinning, fatigue and mild intermittent nausea/Vomiting the first week.  -I encouraged her to use Zofran 3 times a day before each meal from day 3, for 3-5 days and use Compazine as needed, to better control her nausea -Labs reviewed and adequate to proceed with C2 AC today.  -f/u in 2 weeks with NP Lacie   2. Multiple Skin lesionsin chest -She has multiple moles and skin lesions of her b/l breasts and chest. The largest skin lesion on right breast measuring 4x5cm.  -patient previously reports seeing a dermatologist who says her skin lesions are benign. Will call [facility] to get path report   3. Genetic Testing  -Her mother had colon cancer and father had bladder cancer, her MGF had cancer and her maternal great grandmother had breast cancer.  -I will check with Genetics to see if she is eligible for testing. She is interested in insurance covers it.   4. Mild anemia  -Secondary to chemo. Will monitor   5. Acid reflux  -Does cause her to cough.  -She has been taking Prilosec OTC. Given this can worsen with chemo and steroids, I encouraged her to continue during treatment.   6. Nausea and Vomiting  -S/p C1 AC -This  was managed with antiemetics and Tums. I reviewed use of Compazine and Zofran with her. I can call in Dexa if not enough. Her weight was mostly maintained but did lose a few pounds. I encouraged her to maintain adequate eating.   PLAN: -Labs reviewed and adequate to proceed with C2 AC today, will schedule her pegfilgrastim injection at [facility] office in 2 days -Lab, flush, f/u and chemo AC in 2 and 4 weeks     No problem-specific Assessment & Plan notes found for this encounter.   No orders of the defined types were placed in this encounter.  All questions were answered. The patient knows to call the clinic with any problems, questions or concerns. No barriers to learning was detected. I spent 20 minutes counseling the patient face to face. The total time spent in the appointment was 25 minutes and more than 50% was on counseling and review of test results     Truitt Merle, MD 04/22/2019   I, Joslyn Devon, am acting as scribe for Truitt Merle, MD.   I have reviewed the above documentation for accuracy and completeness, and I agree with the above.

## 2019-04-22 ENCOUNTER — Inpatient Hospital Stay: Payer: PPO

## 2019-04-22 ENCOUNTER — Telehealth: Payer: Self-pay | Admitting: Hematology

## 2019-04-22 ENCOUNTER — Encounter: Payer: Self-pay | Admitting: Hematology

## 2019-04-22 ENCOUNTER — Inpatient Hospital Stay (HOSPITAL_BASED_OUTPATIENT_CLINIC_OR_DEPARTMENT_OTHER): Payer: PPO | Admitting: Hematology

## 2019-04-22 ENCOUNTER — Other Ambulatory Visit: Payer: Self-pay

## 2019-04-22 VITALS — BP 130/62 | HR 86 | Temp 97.8°F | Resp 18 | Ht 64.0 in | Wt 161.9 lb

## 2019-04-22 DIAGNOSIS — L989 Disorder of the skin and subcutaneous tissue, unspecified: Secondary | ICD-10-CM | POA: Diagnosis not present

## 2019-04-22 DIAGNOSIS — F329 Major depressive disorder, single episode, unspecified: Secondary | ICD-10-CM | POA: Diagnosis not present

## 2019-04-22 DIAGNOSIS — Z5189 Encounter for other specified aftercare: Secondary | ICD-10-CM | POA: Diagnosis not present

## 2019-04-22 DIAGNOSIS — D6481 Anemia due to antineoplastic chemotherapy: Secondary | ICD-10-CM | POA: Diagnosis not present

## 2019-04-22 DIAGNOSIS — Z79899 Other long term (current) drug therapy: Secondary | ICD-10-CM | POA: Diagnosis not present

## 2019-04-22 DIAGNOSIS — C50212 Malignant neoplasm of upper-inner quadrant of left female breast: Secondary | ICD-10-CM

## 2019-04-22 DIAGNOSIS — R112 Nausea with vomiting, unspecified: Secondary | ICD-10-CM | POA: Diagnosis not present

## 2019-04-22 DIAGNOSIS — Z95828 Presence of other vascular implants and grafts: Secondary | ICD-10-CM

## 2019-04-22 DIAGNOSIS — R918 Other nonspecific abnormal finding of lung field: Secondary | ICD-10-CM | POA: Diagnosis not present

## 2019-04-22 DIAGNOSIS — Z8052 Family history of malignant neoplasm of bladder: Secondary | ICD-10-CM | POA: Diagnosis not present

## 2019-04-22 DIAGNOSIS — Z803 Family history of malignant neoplasm of breast: Secondary | ICD-10-CM | POA: Diagnosis not present

## 2019-04-22 DIAGNOSIS — Z171 Estrogen receptor negative status [ER-]: Secondary | ICD-10-CM

## 2019-04-22 DIAGNOSIS — Z5111 Encounter for antineoplastic chemotherapy: Secondary | ICD-10-CM | POA: Diagnosis not present

## 2019-04-22 DIAGNOSIS — Z800 Family history of malignant neoplasm of digestive organs: Secondary | ICD-10-CM | POA: Diagnosis not present

## 2019-04-22 DIAGNOSIS — K219 Gastro-esophageal reflux disease without esophagitis: Secondary | ICD-10-CM | POA: Diagnosis not present

## 2019-04-22 LAB — CMP (CANCER CENTER ONLY)
ALT: 18 U/L (ref 0–44)
AST: 15 U/L (ref 15–41)
Albumin: 4 g/dL (ref 3.5–5.0)
Alkaline Phosphatase: 76 U/L (ref 38–126)
Anion gap: 9 (ref 5–15)
BUN: 19 mg/dL (ref 8–23)
CO2: 26 mmol/L (ref 22–32)
Calcium: 9.1 mg/dL (ref 8.9–10.3)
Chloride: 104 mmol/L (ref 98–111)
Creatinine: 0.87 mg/dL (ref 0.44–1.00)
GFR, Est AFR Am: >60 mL/min (ref >60)
GFR, Estimated: >60 mL/min (ref >60)
Glucose, Bld: 107 mg/dL — ABNORMAL HIGH (ref 70–99)
Potassium: 4.2 mmol/L (ref 3.5–5.1)
Sodium: 139 mmol/L (ref 135–145)
Total Bilirubin: <0.2 mg/dL — ABNORMAL LOW (ref 0.3–1.2)
Total Protein: 6.9 g/dL (ref 6.5–8.1)

## 2019-04-22 LAB — CBC WITH DIFFERENTIAL (CANCER CENTER ONLY)
Abs Immature Granulocytes: 0.44 10*3/uL — ABNORMAL HIGH (ref 0.00–0.07)
Basophils Absolute: 0.1 10*3/uL (ref 0.0–0.1)
Basophils Relative: 1 %
Eosinophils Absolute: 0 10*3/uL (ref 0.0–0.5)
Eosinophils Relative: 0 %
HCT: 35 % — ABNORMAL LOW (ref 36.0–46.0)
Hemoglobin: 11.6 g/dL — ABNORMAL LOW (ref 12.0–15.0)
Immature Granulocytes: 5 %
Lymphocytes Relative: 20 %
Lymphs Abs: 1.8 10*3/uL (ref 0.7–4.0)
MCH: 30.4 pg (ref 26.0–34.0)
MCHC: 33.1 g/dL (ref 30.0–36.0)
MCV: 91.6 fL (ref 80.0–100.0)
Monocytes Absolute: 1.1 10*3/uL — ABNORMAL HIGH (ref 0.1–1.0)
Monocytes Relative: 13 %
Neutro Abs: 5.5 10*3/uL (ref 1.7–7.7)
Neutrophils Relative %: 61 %
Platelet Count: 282 10*3/uL (ref 150–400)
RBC: 3.82 MIL/uL — ABNORMAL LOW (ref 3.87–5.11)
RDW: 11.9 % (ref 11.5–15.5)
WBC Count: 9 10*3/uL (ref 4.0–10.5)
nRBC: 0 % (ref 0.0–0.2)

## 2019-04-22 MED ORDER — SODIUM CHLORIDE 0.9 % IV SOLN
600.0000 mg/m2 | Freq: Once | INTRAVENOUS | Status: AC
  Administered 2019-04-22: 1100 mg via INTRAVENOUS
  Filled 2019-04-22: qty 55

## 2019-04-22 MED ORDER — DOXORUBICIN HCL CHEMO IV INJECTION 2 MG/ML
60.0000 mg/m2 | Freq: Once | INTRAVENOUS | Status: AC
  Administered 2019-04-22: 110 mg via INTRAVENOUS
  Filled 2019-04-22: qty 55

## 2019-04-22 MED ORDER — SODIUM CHLORIDE 0.9 % IV SOLN
Freq: Once | INTRAVENOUS | Status: AC
  Administered 2019-04-22: 11:00:00 via INTRAVENOUS
  Filled 2019-04-22: qty 250

## 2019-04-22 MED ORDER — SODIUM CHLORIDE 0.9 % IV SOLN
Freq: Once | INTRAVENOUS | Status: AC
  Administered 2019-04-22: 12:00:00 via INTRAVENOUS
  Filled 2019-04-22: qty 5

## 2019-04-22 MED ORDER — PALONOSETRON HCL INJECTION 0.25 MG/5ML
INTRAVENOUS | Status: AC
  Filled 2019-04-22: qty 5

## 2019-04-22 MED ORDER — SODIUM CHLORIDE 0.9% FLUSH
10.0000 mL | INTRAVENOUS | Status: DC | PRN
  Administered 2019-04-22: 10 mL
  Filled 2019-04-22: qty 10

## 2019-04-22 MED ORDER — HEPARIN SOD (PORK) LOCK FLUSH 100 UNIT/ML IV SOLN
500.0000 [IU] | Freq: Once | INTRAVENOUS | Status: AC | PRN
  Administered 2019-04-22: 500 [IU]
  Filled 2019-04-22: qty 5

## 2019-04-22 MED ORDER — PALONOSETRON HCL INJECTION 0.25 MG/5ML
0.2500 mg | Freq: Once | INTRAVENOUS | Status: AC
  Administered 2019-04-22: 0.25 mg via INTRAVENOUS

## 2019-04-22 MED ORDER — SODIUM CHLORIDE 0.9% FLUSH
10.0000 mL | INTRAVENOUS | Status: DC | PRN
  Administered 2019-04-22: 10:00:00 10 mL via INTRAVENOUS
  Filled 2019-04-22: qty 10

## 2019-04-22 NOTE — Patient Instructions (Signed)
[facility] Discharge Instructions for Patients Receiving Chemotherapy  Today you received the following chemotherapy agents: Adriamycin, Cytoxan  To help prevent nausea and vomiting after your treatment, we encourage you to take your nausea medication as directed.   If you develop nausea and vomiting that is not controlled by your nausea medication, call the clinic.   BELOW ARE SYMPTOMS THAT SHOULD BE REPORTED IMMEDIATELY:  *FEVER GREATER THAN 100.5 F  *CHILLS WITH OR WITHOUT FEVER  NAUSEA AND VOMITING THAT IS NOT CONTROLLED WITH YOUR NAUSEA MEDICATION  *UNUSUAL SHORTNESS OF BREATH  *UNUSUAL BRUISING OR BLEEDING  TENDERNESS IN MOUTH AND THROAT WITH OR WITHOUT PRESENCE OF ULCERS  *URINARY PROBLEMS  *BOWEL PROBLEMS  UNUSUAL RASH Items with * indicate a potential emergency and should be followed up as soon as possible.  Feel free to call the clinic should you have any questions or concerns. The clinic phone number is [phone].  Please show the CHEMO ALERT CARD at check-in to the Emergency Department and triage nurse.   

## 2019-04-22 NOTE — Progress Notes (Signed)
Met with patient at registration to introduce myself as Arboriculturist and to offer available resources.  Discussed one-time $1000 Radio broadcast assistant to assist with personal expenses while going through treatment.  Gave her my card if interested in applying and for any additional financial questions or concerns.

## 2019-04-22 NOTE — Telephone Encounter (Signed)
Gave to tonya at [facility] to schedule she will call patient with appointment

## 2019-04-23 ENCOUNTER — Telehealth: Payer: Self-pay | Admitting: Hematology

## 2019-04-23 NOTE — Telephone Encounter (Signed)
Call regarding 11/27

## 2019-04-24 ENCOUNTER — Other Ambulatory Visit: Payer: Self-pay

## 2019-04-24 ENCOUNTER — Inpatient Hospital Stay: Payer: PPO

## 2019-04-24 VITALS — BP 102/57 | HR 97 | Temp 97.1°F | Resp 18

## 2019-04-24 DIAGNOSIS — C50212 Malignant neoplasm of upper-inner quadrant of left female breast: Secondary | ICD-10-CM

## 2019-04-24 DIAGNOSIS — Z5111 Encounter for antineoplastic chemotherapy: Secondary | ICD-10-CM | POA: Diagnosis not present

## 2019-04-24 DIAGNOSIS — Z171 Estrogen receptor negative status [ER-]: Secondary | ICD-10-CM

## 2019-04-24 MED ORDER — PEGFILGRASTIM-JMDB 6 MG/0.6ML ~~LOC~~ SOSY
6.0000 mg | PREFILLED_SYRINGE | Freq: Once | SUBCUTANEOUS | Status: AC
  Administered 2019-04-24: 11:00:00 6 mg via SUBCUTANEOUS

## 2019-04-24 MED ORDER — PEGFILGRASTIM-JMDB 6 MG/0.6ML ~~LOC~~ SOSY
PREFILLED_SYRINGE | SUBCUTANEOUS | Status: AC
  Filled 2019-04-24: qty 0.6

## 2019-04-24 NOTE — Patient Instructions (Signed)

## 2019-04-27 ENCOUNTER — Telehealth: Payer: Self-pay | Admitting: *Deleted

## 2019-04-27 NOTE — Telephone Encounter (Signed)
-----   Message from Alla Feeling, NP sent at 04/24/2019  4:06 PM EST ----- Thanks Santiago Glad. Sorry for my delay.   Brienna Bass, please let her know the information below. If she is still interested I will place referral to genetics.   Thanks, Regan Rakers  ----- Message ----- From: Clarene Essex, Counselor Sent: 04/15/2019   4:52 PM EST To: Alla Feeling, NP  HI Regan Rakers,  If patients meet criteria for testing we send a sample and the lab does the preverification.    Looking at her personal and family history, most likely it will be covered, but the lab would let her know if her OOP was higher than $100.  The most she would pay is $250.  Please refer her and we will discuss this with her.  Santiago Glad ----- Message ----- From: Alla Feeling, NP Sent: 04/15/2019   2:32 PM EST To: Clarene Essex, Counselor  Hi Santiago Glad,  Patient is interested in genetics if insurance will cover. Can you please check for her? Thanks, Regan Rakers

## 2019-04-27 NOTE — Telephone Encounter (Signed)
Per [name], NP, called pt about genetic information and cost. Pt verbalized understanding and aware of cost. Message sent to Roma Kayser, Counselor to make aware pt would like to have testing done on next lab appt.

## 2019-04-28 ENCOUNTER — Other Ambulatory Visit: Payer: Self-pay | Admitting: Nurse Practitioner

## 2019-04-28 DIAGNOSIS — Z171 Estrogen receptor negative status [ER-]: Secondary | ICD-10-CM

## 2019-04-28 DIAGNOSIS — C50212 Malignant neoplasm of upper-inner quadrant of left female breast: Secondary | ICD-10-CM

## 2019-04-29 ENCOUNTER — Telehealth: Payer: Self-pay | Admitting: Hematology

## 2019-04-29 NOTE — Telephone Encounter (Signed)
Scheduled appt per 11/17 sch message for genetics. Called pt and they said they are no longer interested and cancelled appt.

## 2019-05-05 ENCOUNTER — Other Ambulatory Visit: Payer: Self-pay | Admitting: Hematology

## 2019-05-05 NOTE — Progress Notes (Signed)
[place]   [phone] [phone]   Clinic Follow up Note   Patient Care Team: Mosie Lukes, MD as PCP - General (Family Medicine) Cherylann Banas, Cherly Anderson, MD (Inactive) (Obstetrics and Gynecology) Rockwell Germany, RN as Oncology Nurse Navigator Mauro Kaufmann, RN as Oncology Nurse Navigator Erroll Luna, MD as Consulting Physician (General Surgery) Truitt Merle, MD as Consulting Physician (Hematology) Gery Pray, MD as Consulting Physician (Radiation Oncology) 05/06/2019  CHIEF COMPLAINT: F/u left breast cancer   SUMMARY OF ONCOLOGIC HISTORY: Oncology History Overview Note  Cancer Staging Malignant neoplasm of upper-inner quadrant of left breast in female, estrogen receptor negative ([place]) Staging form: Breast, AJCC 8th Edition - Clinical stage from 02/06/2019: Stage IB (cT1c, cN0, cM0, G2, ER-, PR-, HER2-) - Signed by Truitt Merle, MD on 02/17/2019 - Pathologic stage from 03/10/2019: Stage IIA (pT2, pN0, cM0, G3, ER-, PR-, HER2-) - Signed by Truitt Merle, MD on 03/12/2019    Malignant neoplasm of upper-inner quadrant of left breast in female, estrogen receptor negative ([place])  02/04/2019 Mammogram   Diagnostic mammogram 02/04/19  IMPRESSION: Highly suspicious 1.7 x1.5 x1.7cm at 10:00 position of UPPER INNER LEFT breast mass 10 cm from nipple corresponding to the screening study finding. Tissue sampling recommended.   No abnormal LEFT axillary lymph nodes.   02/06/2019 Cancer Staging   Staging form: Breast, AJCC 8th Edition - Clinical stage from 02/06/2019: Stage IB (cT1c, cN0, cM0, G2, ER-, PR-, HER2-) - Signed by Truitt Merle, MD on 02/17/2019   02/06/2019 Initial Biopsy   Diagnosis 02/06/19 Breast, left, needle core biopsy, 10 o'clock, 10cm/n, ribbon clip - INVASIVE MAMMARY CARCINOMA.   02/06/2019 Receptors her2   Results: IMMUNOHISTOCHEMICAL AND MORPHOMETRIC ANALYSIS PERFORMED MANUALLY The tumor cells are NEGATIVE for Her2 (1+). Estrogen  Receptor: 0%, NEGATIVE Progesterone Receptor: 0%, NEGATIVE Proliferation Marker Ki67: 40%   02/17/2019 Initial Diagnosis   Malignant neoplasm of upper-inner quadrant of left breast in female, estrogen receptor negative ([place])   03/10/2019 Cancer Staging   Staging form: Breast, AJCC 8th Edition - Pathologic stage from 03/10/2019: Stage IIA (pT2, pN0, cM0, G3, ER-, PR-, HER2-) - Signed by Truitt Merle, MD on 03/12/2019   03/10/2019 Surgery   LEFT BREAST LUMPECTOMY WITH RADIOACTIVE SEED AND LEFT SENTINEL LYMPH NODE MAPPING and PAC placement  by Dr. Brantley Stage  03/10/19   03/10/2019 Pathology Results   DIAGNOSIS: 03/10/19  A. BREAST, LEFT, LUMPECTOMY:  - Invasive ductal carcinoma, grade 3, 2.5 cm  - Carcinoma is 0.3 cm from superior margin  - Medial resection edge is 0.1 cm from carcinoma (final medial margin is  represented by part 2 which is negative for carcinoma)  - Negative for lymphovascular or perineural invasion  - Biopsy site changes  - See oncology table   B. BREAST, LEFT MEDIAL MARGIN, EXCISION:  - Benign breast parenchyma, negative for carcinoma   C. LYMPH NODE, LEFT AXILLARY, SENTINEL BIOPSY:  - Lymph node, negative for carcinoma (0/1)    04/06/2019 Imaging   Bone scan IMPRESSION: Mild focus of uptake is seen involving the anterior portion of left upper rib, but there is no corresponding abnormality seen on the CT scan of the same day. Potentially this may represent degenerative change or secondary to overlying postsurgical change. Attention on follow-up is recommended.   No definite scintigraphic evidence of osseous metastases.   04/06/2019 Imaging   CT CAP IMPRESSION: 1. Surgical changes left breast with borderline enlarged left axillary lymph node. No other definite evidence for metastatic  disease in the chest, abdomen, or pelvis. 2. Tiny bilateral pulmonary nodules. Attention on follow-up recommended. 3. Scattered small hypoattenuating lesions in the liver parenchyma.  Some of these have attenuation higher than would be expected for simple fluid and may be cysts complicated by proteinaceous debris or hemorrhage. MRI without and with contrast recommended to further evaluate. 4. Small to moderate hiatal hernia. 5. Aortic Atherosclerosis (ICD10-I70.0).   04/08/2019 -  Chemotherapy   Chemo AC q2weeks for 4 cycles starting 04/08/19 followed by weekly Taxol for 2-3 months.       CURRENT THERAPY: chemo AC q2weeks for 4 cycles starting 04/08/19 followed by Taxol for 2-3 months  INTERVAL HISTORY: Ms. Ehresman returns for f/u and treatment as scheduled. She completed cycle 2 AC on 04/22/19 with Fulphila on 04/24/19. She has more fatigue this week, but still able to be up all day and get things done. She has occasional lightheadedness and dizziness at times after standing. She is drinking three 20 oz bottles per day. Eating enough. Last week her mouth looked red but without sores, did not limit po. Her n/v improved, required less anti-emetics and none in a week. Cough is stable, reflux also stable. Denies fever, chills, chest pain, dyspnea. Denies neuropathy. Denies pain or concerns with her breasts.    MEDICAL HISTORY:  Past Medical History:  Diagnosis Date  . Allergy   . Depression    ? if bipolar works the best  . Elevated fasting blood sugar    hx  . H/O measles   . Headache   . History of chicken pox   . Hx of migraines   . Microscopic hematuria    advised to see urology ? never went  . Preventative health care 06/20/2011    SURGICAL HISTORY: Past Surgical History:  Procedure Laterality Date  . BREAST LUMPECTOMY WITH RADIOACTIVE SEED AND SENTINEL LYMPH NODE BIOPSY Left 03/10/2019   Procedure: LEFT BREAST LUMPECTOMY WITH RADIOACTIVE SEED AND LEFT SENTINEL LYMPH NODE MAPPING;  Surgeon: Erroll Luna, MD;  Location: [facility];  Service: General;  Laterality: Left;  . endometrial polyps     2 surgeries by Dr Cathlean Cower  . EYE SURGERY  04/29/2009   Rt  eye tighten up  . PORTACATH PLACEMENT N/A 03/10/2019   Procedure: INSERTION PORT-A-CATH WITH ULTRASOUND;  Surgeon: Erroll Luna, MD;  Location: [place];  Service: General;  Laterality: N/A;  . steel plate in skull Left    from a crushed skull    I have reviewed the social history and family history with the patient and they are unchanged from previous note.  ALLERGIES:  is allergic to bromfed and venlafaxine.  MEDICATIONS:  Current Outpatient Medications  Medication Sig Dispense Refill  . acidophilus (RISAQUAD) CAPS capsule Take 1 capsule by mouth daily.    Marland Kitchen FLUoxetine (PROZAC) 40 MG capsule Take 1 capsule (40 mg total) by mouth daily. 90 capsule 0  . lidocaine-prilocaine (EMLA) cream Apply to affected area once 30 g 3  . Multiple Vitamins-Minerals (MULTIVITAMIN WITH MINERALS) tablet Take 1 tablet by mouth daily.    . ondansetron (ZOFRAN) 8 MG tablet Take 1 tablet (8 mg total) by mouth 2 (two) times daily as needed. Start on the third day after chemotherapy. 30 tablet 1  . prochlorperazine (COMPAZINE) 10 MG tablet Take 1 tablet (10 mg total) by mouth every 6 (six) hours as needed (Nausea or vomiting). 30 tablet 1   No current facility-administered medications for this visit.    Facility-Administered Medications  Ordered in Other Visits  Medication Dose Route Frequency Provider Last Rate Last Dose  . sodium chloride flush (NS) 0.9 % injection 10 mL  10 mL Intracatheter PRN Truitt Merle, MD   10 mL at 05/06/19 1433    PHYSICAL EXAMINATION: ECOG PERFORMANCE STATUS: 1 - Symptomatic but completely ambulatory  Vitals:   05/06/19 1049  BP: 122/72  Pulse: 98  Resp: 17  Temp: 98.2 F (36.8 C)  SpO2: 100%   Filed Weights   05/06/19 1049  Weight: 159 lb 11.2 oz (72.4 kg)    GENERAL:alert, no distress and comfortable SKIN: no rash  EYES: sclera clear OROPHARYNX:  LUNGS: clear with normal breathing effort HEART: regular rate & rhythm, no lower extremity edema ABDOMEN: abdomen soft,  non-tender and normal bowel sounds NEURO: alert & oriented x 3 with fluent speech, no focal motor/sensory deficits Breast: incisions completely healed PAC without erythema   LABORATORY DATA:  I have reviewed the data as listed CBC Latest Ref Rng & Units 05/06/2019 04/22/2019 04/08/2019  WBC 4.0 - 10.5 K/uL 11.3(H) 9.0 5.3  Hemoglobin 12.0 - 15.0 g/dL 11.0(L) 11.6(L) 13.0  Hematocrit 36.0 - 46.0 % 32.4(L) 35.0(L) 38.7  Platelets 150 - 400 K/uL 211 282 235     CMP Latest Ref Rng & Units 05/06/2019 04/22/2019 04/08/2019  Glucose 70 - 99 mg/dL 105(H) 107(H) 112(H)  BUN 8 - 23 mg/dL 11 19 23   Creatinine 0.44 - 1.00 mg/dL 0.82 0.87 0.80  Sodium 135 - 145 mmol/L 141 139 142  Potassium 3.5 - 5.1 mmol/L 4.1 4.2 4.1  Chloride 98 - 111 mmol/L 103 104 105  CO2 22 - 32 mmol/L 25 26 25   Calcium 8.9 - 10.3 mg/dL 9.6 9.1 9.4  Total Protein 6.5 - 8.1 g/dL 6.8 6.9 7.1  Total Bilirubin 0.3 - 1.2 mg/dL <0.2(L) <0.2(L) 0.3  Alkaline Phos 38 - 126 U/L 68 76 67  AST 15 - 41 U/L 17 15 15   ALT 0 - 44 U/L 19 18 14       RADIOGRAPHIC STUDIES: I have personally reviewed the radiological images as listed and agreed with the findings in the report. No results found.   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 She underwent left breast lumpectomy with SLNB on 03/10/19.  -pathology report which showsgrade III 2.5cm tumorwhichwas completely removed with clear margins, node negative. She had PAC placed during surgery.  -She underwent staging work-up on 10/26; bone scan shows mild uptake in the left upperribwith no CT correlate, otherwise negative for osseous metastasis. She reports she fell on her left side/rib in July.  -CT CAP shows surgical changes in the left breast with borderline enlarged left axillary lymph node, possibly related to  recent surgery. Plan to repeat ultrasound in 1 month, if the lymph node remains enlarged she may require additional biopsy. Other findings include tiny bilateral pulmonary nodules, and scattered small hypoattenuating lesions in the liver parenchyma, some of which have attenuation higher than would be expected for simple fluid and may be cysts complicated by proteinaceous debris or hemorrhage. MRI with/without contrast is recommended, although the patient has metal plate in her headand cannot undergo MRI. Plan to monitor on CT in the future. Otherwise, no definite evidence of metastatic disease. - baseline echo which shows EF 50-55%, she was seen by Dr.Bensimhonwho feels this was misinterpreted and in fact has a normal EF, no evidence  of heart failure. Plan to repeat echo after cycle 3 AC -She began adjuvant chemo with [facility] and Fulphila on 11/28, she had n/v the night of chemo, managed with compazine and zofran, and mild fatigue and brain fog, but otherwise recovered well.  -Ms. Cisar appears well, she completed 2 cycles AC; n/v improved. She continues to have moderate fatigue and had few episodes of dizziness on standing -Vitals indicate orthostatic hypotension, she will get additional IVF with treatment today. I encouraged her to drink more, she agrees.  -She is otherwise stable, tolerating treatment moderately well. -Labs stable, adequate for treatment.  -Proceed with cycle 3 AC today, injection at [facility] on 11/27 -f/u in 2 weeks for cycle 4 AC  2. CINV -worse the first night of treatment cycle 1, then improved over next few days -managed with compazine and zofran, declined ativan for now -improved after cycle 2  3. Multiple Skin lesionsin chest -She has multiple moles and skin lesions of her b/l breasts and chest. The largest skin lesion on right breast measuring 4x5cm.  -patient reports seeing a dermatologist who says her skin lesions are benign. I have requested path from [facility]  4. Genetic Testing  -Her mother had colon cancer and father had bladder cancer, her MGF had cancer and her maternal great grandmother had breast cancer.  -the referral process has been initiated   5. Orthostatic hypotension -likely related to mild dehydration -BP dropped to 90/60's on standing per my nurse -will support with 1 L NS today with treatment, I encouraged her to drink more  -not on BP meds -wll monitor closely    PLAN: -Labs reviewed -Proceed with cycle 3 AC, injection at [facility] on 11/27 -Add 1 L NS over 2 hours with treatment today for orthostatic hypotension -Increase po hydration -Will message Dr. Haroldine Laws re: repeat echo after cycle 3 -F/u in 2 weeks with cycle 4, then proceed to taxol   All questions were answered. The patient knows to call the clinic with any problems, questions or concerns. No barriers to learning was detected. I spent 20 minutes counseling the patient face to face. The total time spent in the appointment was 25 minutes and more than 50% was on counseling and review of test results     Alla Feeling, NP 05/06/19

## 2019-05-06 ENCOUNTER — Encounter: Payer: Self-pay | Admitting: Medical Oncology

## 2019-05-06 ENCOUNTER — Inpatient Hospital Stay: Payer: PPO

## 2019-05-06 ENCOUNTER — Other Ambulatory Visit: Payer: Self-pay

## 2019-05-06 ENCOUNTER — Encounter: Payer: Self-pay | Admitting: Nurse Practitioner

## 2019-05-06 ENCOUNTER — Inpatient Hospital Stay (HOSPITAL_BASED_OUTPATIENT_CLINIC_OR_DEPARTMENT_OTHER): Payer: PPO | Admitting: Nurse Practitioner

## 2019-05-06 VITALS — BP 122/72 | HR 98 | Temp 98.2°F | Resp 17 | Wt 159.7 lb

## 2019-05-06 DIAGNOSIS — C50212 Malignant neoplasm of upper-inner quadrant of left female breast: Secondary | ICD-10-CM | POA: Diagnosis not present

## 2019-05-06 DIAGNOSIS — Z171 Estrogen receptor negative status [ER-]: Secondary | ICD-10-CM

## 2019-05-06 DIAGNOSIS — I951 Orthostatic hypotension: Secondary | ICD-10-CM

## 2019-05-06 DIAGNOSIS — Z5111 Encounter for antineoplastic chemotherapy: Secondary | ICD-10-CM | POA: Diagnosis not present

## 2019-05-06 DIAGNOSIS — Z95828 Presence of other vascular implants and grafts: Secondary | ICD-10-CM

## 2019-05-06 LAB — CBC WITH DIFFERENTIAL (CANCER CENTER ONLY)
Abs Immature Granulocytes: 0.58 10*3/uL — ABNORMAL HIGH (ref 0.00–0.07)
Basophils Absolute: 0.1 10*3/uL (ref 0.0–0.1)
Basophils Relative: 1 %
Eosinophils Absolute: 0 10*3/uL (ref 0.0–0.5)
Eosinophils Relative: 0 %
HCT: 32.4 % — ABNORMAL LOW (ref 36.0–46.0)
Hemoglobin: 11 g/dL — ABNORMAL LOW (ref 12.0–15.0)
Immature Granulocytes: 5 %
Lymphocytes Relative: 12 %
Lymphs Abs: 1.4 10*3/uL (ref 0.7–4.0)
MCH: 31.1 pg (ref 26.0–34.0)
MCHC: 34 g/dL (ref 30.0–36.0)
MCV: 91.5 fL (ref 80.0–100.0)
Monocytes Absolute: 1.3 10*3/uL — ABNORMAL HIGH (ref 0.1–1.0)
Monocytes Relative: 12 %
Neutro Abs: 7.9 10*3/uL — ABNORMAL HIGH (ref 1.7–7.7)
Neutrophils Relative %: 70 %
Platelet Count: 211 10*3/uL (ref 150–400)
RBC: 3.54 MIL/uL — ABNORMAL LOW (ref 3.87–5.11)
RDW: 12.2 % (ref 11.5–15.5)
WBC Count: 11.3 10*3/uL — ABNORMAL HIGH (ref 4.0–10.5)
nRBC: 0.2 % (ref 0.0–0.2)

## 2019-05-06 LAB — CMP (CANCER CENTER ONLY)
ALT: 19 U/L (ref 0–44)
AST: 17 U/L (ref 15–41)
Albumin: 4.1 g/dL (ref 3.5–5.0)
Alkaline Phosphatase: 68 U/L (ref 38–126)
Anion gap: 13 (ref 5–15)
BUN: 11 mg/dL (ref 8–23)
CO2: 25 mmol/L (ref 22–32)
Calcium: 9.6 mg/dL (ref 8.9–10.3)
Chloride: 103 mmol/L (ref 98–111)
Creatinine: 0.82 mg/dL (ref 0.44–1.00)
GFR, Est AFR Am: >60 mL/min (ref >60)
GFR, Estimated: >60 mL/min (ref >60)
Glucose, Bld: 105 mg/dL — ABNORMAL HIGH (ref 70–99)
Potassium: 4.1 mmol/L (ref 3.5–5.1)
Sodium: 141 mmol/L (ref 135–145)
Total Bilirubin: <0.2 mg/dL — ABNORMAL LOW (ref 0.3–1.2)
Total Protein: 6.8 g/dL (ref 6.5–8.1)

## 2019-05-06 MED ORDER — SODIUM CHLORIDE 0.9 % IV SOLN
Freq: Once | INTRAVENOUS | Status: AC
  Administered 2019-05-06: 12:00:00 via INTRAVENOUS
  Filled 2019-05-06: qty 250

## 2019-05-06 MED ORDER — PALONOSETRON HCL INJECTION 0.25 MG/5ML
INTRAVENOUS | Status: AC
  Filled 2019-05-06: qty 5

## 2019-05-06 MED ORDER — DOXORUBICIN HCL CHEMO IV INJECTION 2 MG/ML
60.0000 mg/m2 | Freq: Once | INTRAVENOUS | Status: AC
  Administered 2019-05-06: 110 mg via INTRAVENOUS
  Filled 2019-05-06: qty 55

## 2019-05-06 MED ORDER — PALONOSETRON HCL INJECTION 0.25 MG/5ML
0.2500 mg | Freq: Once | INTRAVENOUS | Status: AC
  Administered 2019-05-06: 0.25 mg via INTRAVENOUS

## 2019-05-06 MED ORDER — SODIUM CHLORIDE 0.9 % IV SOLN
Freq: Once | INTRAVENOUS | Status: AC
  Administered 2019-05-06: 13:00:00 via INTRAVENOUS
  Filled 2019-05-06: qty 5

## 2019-05-06 MED ORDER — SODIUM CHLORIDE 0.9% FLUSH
10.0000 mL | INTRAVENOUS | Status: DC | PRN
  Administered 2019-05-06: 10 mL
  Filled 2019-05-06: qty 10

## 2019-05-06 MED ORDER — SODIUM CHLORIDE 0.9% FLUSH
10.0000 mL | INTRAVENOUS | Status: DC | PRN
  Administered 2019-05-06: 10 mL via INTRAVENOUS
  Filled 2019-05-06: qty 10

## 2019-05-06 MED ORDER — SODIUM CHLORIDE 0.9 % IV SOLN
600.0000 mg/m2 | Freq: Once | INTRAVENOUS | Status: AC
  Administered 2019-05-06: 1100 mg via INTRAVENOUS
  Filled 2019-05-06: qty 55

## 2019-05-06 MED ORDER — HEPARIN SOD (PORK) LOCK FLUSH 100 UNIT/ML IV SOLN
500.0000 [IU] | Freq: Once | INTRAVENOUS | Status: AC | PRN
  Administered 2019-05-06: 500 [IU]
  Filled 2019-05-06: qty 5

## 2019-05-06 NOTE — Research (Signed)
[id number]: Referral Dr. Burr Medico referred patient to study.  I met with patient this morning, who is here alone, just prior to her appointment with NP, [name]. I introduced myself and informed her that Dr. Burr Medico had referred her to the study and that I was going to provide her with some information on the study. I gave the patient a brief explanation about the study and some of the assessments involved. Patient is aware that this is a non-treatment study and that participation is voluntary. Patient was also informed that consent and baseline assessments would need to be completed prior to the start of taxol. Patient expressed interest in study, as long as she doesn't have to make extra visits to the clinic. I provided the patient with a copy of the study consent and authorization forms for her review, as well as, the Clinical Trials Information pamphlet and my contact information and encouraged her to review the information for a complete understanding of what the study entails.  All patient's questions answered to her satisfaction, patient and I made plans for me to follow up with her toward the end of next week. Patient was thanked for her time and encouraged to call Dr. Burr Medico or myself with any questions in the meantime.  Patient is currently scheduled to start Taxol treatment 06/03/19. Maxwell Marion, RN, BSN, [facility] Clinical Research 05/06/2019 11:42 AM

## 2019-05-06 NOTE — Patient Instructions (Signed)
[facility] Discharge Instructions for Patients Receiving Chemotherapy  Today you received the following chemotherapy agents: Adriamycin, Cytoxan  To help prevent nausea and vomiting after your treatment, we encourage you to take your nausea medication as directed.   If you develop nausea and vomiting that is not controlled by your nausea medication, call the clinic.   BELOW ARE SYMPTOMS THAT SHOULD BE REPORTED IMMEDIATELY:  *FEVER GREATER THAN 100.5 F  *CHILLS WITH OR WITHOUT FEVER  NAUSEA AND VOMITING THAT IS NOT CONTROLLED WITH YOUR NAUSEA MEDICATION  *UNUSUAL SHORTNESS OF BREATH  *UNUSUAL BRUISING OR BLEEDING  TENDERNESS IN MOUTH AND THROAT WITH OR WITHOUT PRESENCE OF ULCERS  *URINARY PROBLEMS  *BOWEL PROBLEMS  UNUSUAL RASH Items with * indicate a potential emergency and should be followed up as soon as possible.  Feel free to call the clinic should you have any questions or concerns. The clinic phone number is [phone].  Please show the CHEMO ALERT CARD at check-in to the Emergency Department and triage nurse.   

## 2019-05-08 ENCOUNTER — Ambulatory Visit: Payer: PPO

## 2019-05-08 ENCOUNTER — Inpatient Hospital Stay: Payer: PPO

## 2019-05-08 ENCOUNTER — Other Ambulatory Visit: Payer: Self-pay

## 2019-05-08 VITALS — BP 107/58 | HR 108 | Temp 98.0°F | Resp 18

## 2019-05-08 DIAGNOSIS — Z171 Estrogen receptor negative status [ER-]: Secondary | ICD-10-CM

## 2019-05-08 DIAGNOSIS — Z5111 Encounter for antineoplastic chemotherapy: Secondary | ICD-10-CM | POA: Diagnosis not present

## 2019-05-08 DIAGNOSIS — C50212 Malignant neoplasm of upper-inner quadrant of left female breast: Secondary | ICD-10-CM

## 2019-05-08 MED ORDER — PEGFILGRASTIM-JMDB 6 MG/0.6ML ~~LOC~~ SOSY
PREFILLED_SYRINGE | SUBCUTANEOUS | Status: AC
  Filled 2019-05-08: qty 0.6

## 2019-05-08 MED ORDER — PEGFILGRASTIM-JMDB 6 MG/0.6ML ~~LOC~~ SOSY
6.0000 mg | PREFILLED_SYRINGE | Freq: Once | SUBCUTANEOUS | Status: AC
  Administered 2019-05-08: 6 mg via SUBCUTANEOUS

## 2019-05-08 NOTE — Patient Instructions (Signed)

## 2019-05-08 NOTE — Progress Notes (Signed)
Pt stated she felt dizzy but that she felt the same way after the prior treatment. Her BP was 107/58 but she declined any other symptoms. I informed her that our symptom management was closed today but I would make her provider aware.

## 2019-05-11 ENCOUNTER — Telehealth: Payer: Self-pay | Admitting: *Deleted

## 2019-05-11 NOTE — Telephone Encounter (Signed)
Per [name], NP, called to check in with pt. Pt stated, "this has been the worse go round." Pt stated she has had, n/v/d for 4-5 days with no hunger and not able to drink much fluids.Today has been the first day she has eaten. Pt state, "I forced down a melted cheese wrap , drank water and coffee." Pt is using meds for nausea, doesn't have imodium but will get some from the store. Recommended to pt to come in to receive fluids. Pt declined. Advised pt to continue to push fluids and utilize medications. Pt verbalized understanding and was very appreciative.

## 2019-05-12 ENCOUNTER — Telehealth: Payer: Self-pay

## 2019-05-12 NOTE — Telephone Encounter (Signed)
Patient calls stating that this last round of chemotherapy has been very difficult.  She is wondering if the next round could be at a reduced dose or an alternative drug could be used.  She has an appointment with the cardiologist on Friday.  She states she just wanted to put it out there before her next treatment.   [phone]

## 2019-05-12 NOTE — Telephone Encounter (Signed)
Please let her know that I will evaluate her before next cycle chemo, and will reduce chemo dose. Thanks   Truitt Merle MD

## 2019-05-12 NOTE — Telephone Encounter (Signed)
Yes, certainly will talk about dose for her next cycle when she sees Dr. Burr Medico on 12/9 before cycle 4 AC. Please encourage her to consider coming in for lab, fluids and visit if she is having a hard time.  Thanks, Regan Rakers

## 2019-05-12 NOTE — Telephone Encounter (Signed)
Spoke with patient and offered for her to come in for IVF and to be seen.  She states that she isn't dehydrated and is pushing water.  She declined the offer to come in however did want Dr. Burr Medico to have the information.  I assured her that Dr. Burr Medico would be made aware.

## 2019-05-14 ENCOUNTER — Encounter: Payer: Self-pay | Admitting: Medical Oncology

## 2019-05-14 DIAGNOSIS — C50212 Malignant neoplasm of upper-inner quadrant of left female breast: Secondary | ICD-10-CM

## 2019-05-14 NOTE — Progress Notes (Signed)
[id number]: Referral Follow up call to patient regarding study. I spoke with patient and inquired as to whether she has any questions about the study. Patient stated she read through the entire consent and authorization forms and stated that she did not wish to participate. Patient states that she has too much going on with chemotherapy treatment and does not wish to add anymore. I thanked patient for her time and consideration of study.  Patient encouraged to talk to Dr. Burr Medico with questions.  Maxwell Marion, RN, BSN, [facility] Clinical Research 05/14/2019 2:45 PM

## 2019-05-15 ENCOUNTER — Ambulatory Visit (HOSPITAL_BASED_OUTPATIENT_CLINIC_OR_DEPARTMENT_OTHER)
Admission: RE | Admit: 2019-05-15 | Discharge: 2019-05-15 | Disposition: A | Discharge status: Home / Self Care | Payer: PPO | Source: Ambulatory Visit | Attending: Internal Medicine | Admitting: Internal Medicine

## 2019-05-15 ENCOUNTER — Encounter (HOSPITAL_COMMUNITY): Payer: PPO | Admitting: Internal Medicine

## 2019-05-15 ENCOUNTER — Other Ambulatory Visit: Payer: Self-pay

## 2019-05-15 ENCOUNTER — Encounter (HOSPITAL_COMMUNITY): Payer: Self-pay | Admitting: Internal Medicine

## 2019-05-15 ENCOUNTER — Ambulatory Visit (HOSPITAL_COMMUNITY)
Admission: RE | Admit: 2019-05-15 | Discharge: 2019-05-15 | Disposition: A | Discharge status: Home / Self Care | Payer: PPO | Source: Ambulatory Visit | Attending: Internal Medicine | Admitting: Internal Medicine

## 2019-05-15 VITALS — BP 101/59 | HR 101 | Wt 156.6 lb

## 2019-05-15 DIAGNOSIS — G43909 Migraine, unspecified, not intractable, without status migrainosus: Secondary | ICD-10-CM | POA: Insufficient documentation

## 2019-05-15 DIAGNOSIS — F329 Major depressive disorder, single episode, unspecified: Secondary | ICD-10-CM | POA: Diagnosis not present

## 2019-05-15 DIAGNOSIS — R5383 Other fatigue: Secondary | ICD-10-CM | POA: Insufficient documentation

## 2019-05-15 DIAGNOSIS — C50212 Malignant neoplasm of upper-inner quadrant of left female breast: Secondary | ICD-10-CM | POA: Diagnosis not present

## 2019-05-15 DIAGNOSIS — Z800 Family history of malignant neoplasm of digestive organs: Secondary | ICD-10-CM | POA: Diagnosis not present

## 2019-05-15 DIAGNOSIS — Z171 Estrogen receptor negative status [ER-]: Secondary | ICD-10-CM | POA: Insufficient documentation

## 2019-05-15 DIAGNOSIS — Z87891 Personal history of nicotine dependence: Secondary | ICD-10-CM | POA: Insufficient documentation

## 2019-05-15 DIAGNOSIS — R5381 Other malaise: Secondary | ICD-10-CM

## 2019-05-15 DIAGNOSIS — Z79899 Other long term (current) drug therapy: Secondary | ICD-10-CM | POA: Insufficient documentation

## 2019-05-15 DIAGNOSIS — R531 Weakness: Secondary | ICD-10-CM | POA: Insufficient documentation

## 2019-05-15 DIAGNOSIS — Z8052 Family history of malignant neoplasm of bladder: Secondary | ICD-10-CM | POA: Insufficient documentation

## 2019-05-15 DIAGNOSIS — R002 Palpitations: Secondary | ICD-10-CM | POA: Diagnosis not present

## 2019-05-15 DIAGNOSIS — Z8349 Family history of other endocrine, nutritional and metabolic diseases: Secondary | ICD-10-CM | POA: Insufficient documentation

## 2019-05-15 DIAGNOSIS — Z888 Allergy status to other drugs, medicaments and biological substances status: Secondary | ICD-10-CM | POA: Diagnosis not present

## 2019-05-15 DIAGNOSIS — Z5181 Encounter for therapeutic drug level monitoring: Secondary | ICD-10-CM | POA: Diagnosis not present

## 2019-05-15 DIAGNOSIS — R5382 Chronic fatigue, unspecified: Secondary | ICD-10-CM | POA: Diagnosis not present

## 2019-05-15 DIAGNOSIS — Z825 Family history of asthma and other chronic lower respiratory diseases: Secondary | ICD-10-CM | POA: Insufficient documentation

## 2019-05-15 NOTE — Progress Notes (Signed)
CARDIO-ONCOLOGY CLINIC CONSULT NOTE  Referring Physician: Dr. Burr Medico Primary Care: Dr. Penni Homans Oncology: Dr. Burr Medico  HPI:  Cindy [name] is a 68 y.o. female former [employer] employee with h/o depression and migraine HAs as well as left breast cancer referred by Dr. Burr Medico for enrollment into the Cardio-Oncology program.  Oncology History Overview Note   Cancer Staging Malignant neoplasm of upper-inner quadrant of left breast in female, estrogen receptor negative ([place]) Staging form: Breast, AJCC 8th Edition - Clinical stage from 02/06/2019: Stage IB (cT1c, cN0, cM0, G2, ER-, PR-, HER2-) - Signed by Truitt Merle, MD on 02/17/2019 - Pathologic stage from 03/10/2019: Stage IIA (pT2, pN0, cM0, G3, ER-, PR-, HER2-) - Signed by Truitt Merle, MD on 03/12/2019    Malignant neoplasm of upper-inner quadrant of left breast in female, estrogen receptor negative ([place])   02/04/2019 Mammogram    Diagnostic mammogram 02/04/19  IMPRESSION: Highly suspicious 1.7 x1.5 x1.7cm at 10:00 position of UPPER INNER LEFT breast mass 10 cm from nipple corresponding to the screening study finding. Tissue sampling recommended.  No abnormal LEFT axillary lymph nodes.    02/06/2019 Cancer Staging    Staging form: Breast, AJCC 8th Edition - Clinical stage from 02/06/2019: Stage IB (cT1c, cN0, cM0, G2, ER-, PR-, HER2-) - Signed by Truitt Merle, MD on 02/17/2019    02/06/2019 Initial Biopsy    Diagnosis 02/06/19 Breast, left, needle core biopsy, 10 o'clock, 10cm/n, ribbon clip - INVASIVE MAMMARY CARCINOMA.    02/06/2019 Receptors her2    Results: IMMUNOHISTOCHEMICAL AND MORPHOMETRIC ANALYSIS PERFORMED MANUALLY The tumor cells are NEGATIVE for Her2 (1+). Estrogen Receptor: 0%, NEGATIVE Progesterone Receptor: 0%, NEGATIVE Proliferation Marker Ki67: 40%    02/17/2019 Initial Diagnosis    Malignant neoplasm of upper-inner quadrant of left breast in female, estrogen receptor negative ([place])    03/10/2019 Cancer Staging    Staging  form: Breast, AJCC 8th Edition - Pathologic stage from 03/10/2019: Stage IIA (pT2, pN0, cM0, G3, ER-, PR-, HER2-) - Signed by Truitt Merle, MD on 03/12/2019    03/10/2019 Surgery    LEFT BREAST LUMPECTOMY WITH RADIOACTIVE SEED AND LEFT SENTINEL LYMPH NODE MAPPING and PAC placement  by Dr. Brantley Stage  03/10/19    03/10/2019 Pathology Results    DIAGNOSIS: 03/10/19  A. BREAST, LEFT, LUMPECTOMY:  - Invasive ductal carcinoma, grade 3, 2.5 cm  - Carcinoma is 0.3 cm from superior margin  - Medial resection edge is 0.1 cm from carcinoma (final medial margin is  represented by part 2 which is negative for carcinoma)  - Negative for lymphovascular or perineural invasion  - Biopsy site changes  - See oncology table   B. BREAST, LEFT MEDIAL MARGIN, EXCISION:  - Benign breast parenchyma, negative for carcinoma   C. LYMPH NODE, LEFT AXILLARY, SENTINEL BIOPSY:  - Lymph node, negative for carcinoma (0/1)      Chemotherapy    PENDING chemo AC q2weeks for 4 cycles starting 04/08/19 followed by Taxol for 2-3 months.          Recently diagnosed with left breast cancer after experiencing left breast pain. Has had lumpectomy and sentinnel node biopsy.  Pending chemo AC q2weeks for 4 cycles starting 04/08/19 followed by Taxol for 2-3 months.   As part of pre-chemo w/u had echo that was read as EF 50-55% with possible mild apical HK. Denies any h/o heart disease or significant CRFs. Smoked but quit at age 28s.   Just finished 4th cycle of AC. Deels very weak and exhausted. Struggling  with ADLs.No fevers or chills. No CP, orthopnea, PND or edema  10/20 ECHO: EF 60-65% with grade 2 DD no RWMA. On the strain imaging mild decrease in apical strain due to poor endocardial tracking (it is normal)  05/15/19 Echo EF 65-67% RV normal. GLS not accurate     Past Medical History:  Diagnosis Date  . Allergy   . Depression    ? if bipolar works the best  . Elevated fasting blood sugar    hx  .  H/O measles   . Headache   . History of chicken pox   . Hx of migraines   . Microscopic hematuria    advised to see urology ? never went  . Preventative health care 06/20/2011    Current Outpatient Medications  Medication Sig Dispense Refill  . acidophilus (RISAQUAD) CAPS capsule Take 1 capsule by mouth daily.    Marland Kitchen FLUoxetine (PROZAC) 40 MG capsule Take 1 capsule (40 mg total) by mouth daily. 90 capsule 0  . lidocaine-prilocaine (EMLA) cream Apply to affected area once 30 g 3  . Multiple Vitamins-Minerals (MULTIVITAMIN WITH MINERALS) tablet Take 1 tablet by mouth daily.    . ondansetron (ZOFRAN) 8 MG tablet Take 1 tablet (8 mg total) by mouth 2 (two) times daily as needed. Start on the third day after chemotherapy. 30 tablet 1  . prochlorperazine (COMPAZINE) 10 MG tablet Take 1 tablet (10 mg total) by mouth every 6 (six) hours as needed (Nausea or vomiting). 30 tablet 1   No current facility-administered medications for this encounter.     Allergies  Allergen Reactions  . Bromfed Shortness Of Breath  . Venlafaxine     Doesn't remember reaction      Social History   Socioeconomic History  . Marital status: Single    Spouse name: Not on file  . Number of children: Not on file  . Years of education: Not on file  . Highest education level: Not on file  Occupational History  . Occupation: retired   Scientific laboratory technician  . Financial resource strain: Not on file  . Food insecurity    Worry: Not on file    Inability: Not on file  . Transportation needs    Medical: Not on file    Non-medical: Not on file  Tobacco Use  . Smoking status: Former Smoker    Packs/day: 0.25    Years: 10.00    Pack years: 2.50  . Smokeless tobacco: Never Used  . Tobacco comment: social smoker in her 20-30's   Substance and Sexual Activity  . Alcohol use: Not Currently    Alcohol/week: 1.0 - 2.0 standard drinks    Types: 1 - 2 Glasses of wine per week    Comment: very rare on social occasions  . Drug  use: No  . Sexual activity: Not on file  Lifestyle  . Physical activity    Days per week: Not on file    Minutes per session: Not on file  . Stress: Not on file  Relationships  . Social Herbalist on phone: Not on file    Gets together: Not on file    Attends religious service: Not on file    Active member of club or organization: Not on file    Attends meetings of clubs or organizations: Not on file    Relationship status: Not on file  . Intimate partner violence    Fear of current or ex partner: Not on  file    Emotionally abused: Not on file    Physically abused: Not on file    Forced sexual activity: Not on file  Other Topics Concern  . Not on file  Social History Narrative   HH of 1   2 Cats   Caffeine 2 mugs   Exercise recently some, no major dietary restrictions at this time. Was vegan now eats chicken and poultry   Works at [employer] in [place] History  Problem Relation Age of Onset  . Alzheimer's disease Mother   . Cancer Mother 40       colon  . Thyroid disease Father   . COPD Father   . Cancer Father        bladder with cystectomy  . Cancer Maternal Grandfather        colon cancer  . Cancer Other        maternal grant granmother had breast cancer     Vitals:   05/15/19 1507  BP: (!) 101/59  Pulse: (!) 101  SpO2: 99%  Weight: 71 kg (156 lb 9.6 oz)    PHYSICAL EXAM: General:  Weak appearing. No resp difficulty HEENT: normal Neck: supple. no JVD. Carotids 2+ bilat; no bruits. No lymphadenopathy or thryomegaly appreciated. Cor: PMI nondisplaced. Regular mildly tachycardic No rubs, gallops or murmurs. R port Lungs: clear Abdomen: soft, nontender, nondistended. No hepatosplenomegaly. No bruits or masses. Good bowel sounds. Extremities: no cyanosis, clubbing, rash, edema Neuro: alert & orientedx3, cranial nerves grossly intact. moves all 4 extremities w/o difficulty. Affect pleasant   ASSESSMENT & PLAN: 1. Left Breast Cancer - s/p  4 rounds AC - I reviewed echos personally. EF and Doppler parameters stable. No HF on exam.  - Repeat echo in 6 months to ensure stability   2. Palpitations - improved. Place Zio as needed  3. Weakness/fatigue - suspect chemo related. I suggested we check labs today to assess CBC, electrolytes and thyroid but she deferred saying she just wanted to go home. She will f/u with Dr. Burr Medico.   Glori Bickers, MD  3:02 PM

## 2019-05-15 NOTE — Progress Notes (Signed)
  Echocardiogram 2D Echocardiogram has been performed.  Cindy [name] 05/15/2019, 2:52 PM

## 2019-05-18 NOTE — Progress Notes (Signed)
[name]   Telephone:(336) [phone] [phone]   Clinic Follow up Note   Patient Care Team: Mosie Lukes, MD as PCP - General (Family Medicine) Cherylann Banas, Cherly Anderson, MD (Inactive) (Obstetrics and Gynecology) Rockwell Germany, RN as Oncology Nurse Navigator Mauro Kaufmann, RN as Oncology Nurse Navigator Erroll Luna, MD as Consulting Physician (General Surgery) Truitt Merle, MD as Consulting Physician (Hematology) Gery Pray, MD as Consulting Physician (Radiation Oncology)  Date of Service:  05/20/2019  CHIEF COMPLAINT: F/u of left breast cancer  SUMMARY OF ONCOLOGIC HISTORY: Oncology History Overview Note  Cancer Staging Malignant neoplasm of upper-inner quadrant of left breast in female, estrogen receptor negative ([place]) Staging form: Breast, AJCC 8th Edition - Clinical stage from 02/06/2019: Stage IB (cT1c, cN0, cM0, G2, ER-, PR-, HER2-) - Signed by Truitt Merle, MD on 02/17/2019 - Pathologic stage from 03/10/2019: Stage IIA (pT2, pN0, cM0, G3, ER-, PR-, HER2-) - Signed by Truitt Merle, MD on 03/12/2019    Malignant neoplasm of upper-inner quadrant of left breast in female, estrogen receptor negative ([place])  02/04/2019 Mammogram   Diagnostic mammogram 02/04/19  IMPRESSION: Highly suspicious 1.7 x1.5 x1.7cm at 10:00 position of UPPER INNER LEFT breast mass 10 cm from nipple corresponding to the screening study finding. Tissue sampling recommended.   No abnormal LEFT axillary lymph nodes.   02/06/2019 Cancer Staging   Staging form: Breast, AJCC 8th Edition - Clinical stage from 02/06/2019: Stage IB (cT1c, cN0, cM0, G2, ER-, PR-, HER2-) - Signed by Truitt Merle, MD on 02/17/2019   02/06/2019 Initial Biopsy   Diagnosis 02/06/19 Breast, left, needle core biopsy, 10 o'clock, 10cm/n, ribbon clip - INVASIVE MAMMARY CARCINOMA.   02/06/2019 Receptors her2   Results: IMMUNOHISTOCHEMICAL AND MORPHOMETRIC ANALYSIS PERFORMED MANUALLY The tumor cells are NEGATIVE for Her2  (1+). Estrogen Receptor: 0%, NEGATIVE Progesterone Receptor: 0%, NEGATIVE Proliferation Marker Ki67: 40%   02/17/2019 Initial Diagnosis   Malignant neoplasm of upper-inner quadrant of left breast in female, estrogen receptor negative ([place])   03/10/2019 Cancer Staging   Staging form: Breast, AJCC 8th Edition - Pathologic stage from 03/10/2019: Stage IIA (pT2, pN0, cM0, G3, ER-, PR-, HER2-) - Signed by Truitt Merle, MD on 03/12/2019   03/10/2019 Surgery   LEFT BREAST LUMPECTOMY WITH RADIOACTIVE SEED AND LEFT SENTINEL LYMPH NODE MAPPING and PAC placement  by Dr. Brantley Stage  03/10/19   03/10/2019 Pathology Results   DIAGNOSIS: 03/10/19  A. BREAST, LEFT, LUMPECTOMY:  - Invasive ductal carcinoma, grade 3, 2.5 cm  - Carcinoma is 0.3 cm from superior margin  - Medial resection edge is 0.1 cm from carcinoma (final medial margin is  represented by part 2 which is negative for carcinoma)  - Negative for lymphovascular or perineural invasion  - Biopsy site changes  - See oncology table   B. BREAST, LEFT MEDIAL MARGIN, EXCISION:  - Benign breast parenchyma, negative for carcinoma   C. LYMPH NODE, LEFT AXILLARY, SENTINEL BIOPSY:  - Lymph node, negative for carcinoma (0/1)    04/06/2019 Imaging   Bone scan IMPRESSION: Mild focus of uptake is seen involving the anterior portion of left upper rib, but there is no corresponding abnormality seen on the CT scan of the same day. Potentially this may represent degenerative change or secondary to overlying postsurgical change. Attention on follow-up is recommended.   No definite scintigraphic evidence of osseous metastases.   04/06/2019 Imaging   CT CAP IMPRESSION: 1. Surgical changes left breast with borderline enlarged left axillary lymph node. No  other definite evidence for metastatic disease in the chest, abdomen, or pelvis. 2. Tiny bilateral pulmonary nodules. Attention on follow-up recommended. 3. Scattered small hypoattenuating lesions in the  liver parenchyma. Some of these have attenuation higher than would be expected for simple fluid and may be cysts complicated by proteinaceous debris or hemorrhage. MRI without and with contrast recommended to further evaluate. 4. Small to moderate hiatal hernia. 5. Aortic Atherosclerosis (ICD10-I70.0).   04/08/2019 -  Chemotherapy   Chemo AC q2weeks for 4 cycles starting 04/08/19 followed by weekly Taxol for 2-3 months.        CURRENT THERAPY:  chemo AC q2weeks for 4 cycles starting 04/08/19 followed by Taxol for 2-3 months  INTERVAL HISTORY:  Cindy [name] is here for a follow up and treatment. She presents to the clinic alone. She notes she is doing well today. She notes with C3 she had significant weakness which made it very hard to get around. She was fatigued and had foggy brain. This lasted for 10 days. She notes these were her main side effects but she also had other mild side effects. She had mild to moderate N&V which improved with antiemetics. She notes she still has no appetite but she has been force eating. Her weight is trending down. She notes 2 days ago she had skin rash of left chest with itching.   She notes she received bill for treatment and after insurance it was charged $700. She is not sure how much her Taxol treatment will be so if expensive she rather do 4 treatment but if not she rather do weekly which is more tolerable.    REVIEW OF SYSTEMS:   Constitutional: Denies fevers, chills or abnormal weight loss (+) No appetite, mild weight loss Eyes: Denies blurriness of vision Ears, nose, mouth, throat, and face: Denies mucositis or sore throat Respiratory: Denies cough, dyspnea or wheezes Cardiovascular: Denies palpitation, chest discomfort or lower extremity swelling Gastrointestinal:  Denies nausea, heartburn or change in bowel habits Skin: (+) Skin rash of left chest with itching.  Lymphatics: Denies new lymphadenopathy or easy bruising Neurological:Denies  numbness, tingling or new weaknesses Behavioral/Psych: Mood is stable, no new changes  All other systems were reviewed with the patient and are negative.  MEDICAL HISTORY:  Past Medical History:  Diagnosis Date  . Allergy   . Depression    ? if bipolar works the best  . Elevated fasting blood sugar    hx  . H/O measles   . Headache   . History of chicken pox   . Hx of migraines   . Microscopic hematuria    advised to see urology ? never went  . Preventative health care 06/20/2011    SURGICAL HISTORY: Past Surgical History:  Procedure Laterality Date  . BREAST LUMPECTOMY WITH RADIOACTIVE SEED AND SENTINEL LYMPH NODE BIOPSY Left 03/10/2019   Procedure: LEFT BREAST LUMPECTOMY WITH RADIOACTIVE SEED AND LEFT SENTINEL LYMPH NODE MAPPING;  Surgeon: Erroll Luna, MD;  Location: [facility];  Service: General;  Laterality: Left;  . endometrial polyps     2 surgeries by Dr Cathlean Cower  . EYE SURGERY  04/29/2009   Rt eye tighten up  . PORTACATH PLACEMENT N/A 03/10/2019   Procedure: INSERTION PORT-A-CATH WITH ULTRASOUND;  Surgeon: Erroll Luna, MD;  Location: [facility];  Service: General;  Laterality: N/A;  . steel plate in skull Left    from a crushed skull    I have reviewed the social history and family history with  the patient and they are unchanged from previous note.  ALLERGIES:  is allergic to bromfed and venlafaxine.  MEDICATIONS:  Current Outpatient Medications  Medication Sig Dispense Refill  . FLUoxetine (PROZAC) 40 MG capsule Take 1 capsule (40 mg total) by mouth daily. 90 capsule 0  . lidocaine-prilocaine (EMLA) cream Apply to affected area once 30 g 3  . ondansetron (ZOFRAN) 8 MG tablet Take 1 tablet (8 mg total) by mouth 2 (two) times daily as needed. Start on the third day after chemotherapy. 30 tablet 1  . prochlorperazine (COMPAZINE) 10 MG tablet Take 1 tablet (10 mg total) by mouth every 6 (six) hours as needed (Nausea or vomiting). 30 tablet 1   Current  Facility-Administered Medications  Medication Dose Route Frequency Provider Last Rate Last Dose  . 0.9 %  sodium chloride infusion   Intravenous Continuous Truitt Merle, MD       Facility-Administered Medications Ordered in Other Visits  Medication Dose Route Frequency Provider Last Rate Last Dose  . 0.9 %  sodium chloride infusion   Intravenous Continuous Truitt Merle, MD   Stopped at 05/20/19 1343  . sodium chloride flush (NS) 0.9 % injection 10 mL  10 mL Intracatheter PRN Truitt Merle, MD   10 mL at 05/20/19 1344    PHYSICAL EXAMINATION: ECOG PERFORMANCE STATUS: 1 - Symptomatic but completely ambulatory  Vitals:   05/20/19 1013  BP: (!) 146/77  Pulse: (!) 127  Resp: 19  Temp: 98.5 F (36.9 C)  SpO2: 100%   Filed Weights   05/20/19 1013  Weight: 157 lb 4.8 oz (71.4 kg)    GENERAL:alert, no distress and comfortable SKIN: skin color, texture, turgor are normal (+) small papular skin rash across left upper chest  EYES: normal, Conjunctiva are pink and non-injected, sclera clear  NECK: supple, thyroid normal size, non-tender, without nodularity LYMPH:  no palpable lymphadenopathy in the cervical, axillary  LUNGS: clear to auscultation and percussion with normal breathing effort HEART: regular rate & rhythm and no murmurs and no lower extremity edema ABDOMEN:abdomen soft, non-tender and normal bowel sounds Musculoskeletal:no cyanosis of digits and no clubbing  NEURO: alert & oriented x 3 with fluent speech, no focal motor/sensory deficits  LABORATORY DATA:  I have reviewed the data as listed CBC Latest Ref Rng & Units 05/20/2019 05/06/2019 04/22/2019  WBC 4.0 - 10.5 K/uL 10.3 11.3(H) 9.0  Hemoglobin 12.0 - 15.0 g/dL 8.8(L) 11.0(L) 11.6(L)  Hematocrit 36.0 - 46.0 % 26.0(L) 32.4(L) 35.0(L)  Platelets 150 - 400 K/uL 210 211 282     CMP Latest Ref Rng & Units 05/20/2019 05/06/2019 04/22/2019  Glucose 70 - 99 mg/dL 113(H) 105(H) 107(H)  BUN 8 - 23 mg/dL 12 11 19   Creatinine 0.44 -  1.00 mg/dL 0.76 0.82 0.87  Sodium 135 - 145 mmol/L 139 141 139  Potassium 3.5 - 5.1 mmol/L 4.1 4.1 4.2  Chloride 98 - 111 mmol/L 105 103 104  CO2 22 - 32 mmol/L 26 25 26   Calcium 8.9 - 10.3 mg/dL 8.8(L) 9.6 9.1  Total Protein 6.5 - 8.1 g/dL 6.4(L) 6.8 6.9  Total Bilirubin 0.3 - 1.2 mg/dL <0.2(L) <0.2(L) <0.2(L)  Alkaline Phos 38 - 126 U/L 67 68 76  AST 15 - 41 U/L 12(L) 17 15  ALT 0 - 44 U/L 12 19 18       RADIOGRAPHIC STUDIES: I have personally reviewed the radiological images as listed and agreed with the findings in the report. No results found.   ASSESSMENT &  PLAN:  Cindy [name] is a 68 y.o. female with   1.Malignant neoplasm of upper-inner quadrant of left breast, StageIIA,p(T2N0M0), ER/PR/HER2-,triple negative,GradeIII -She was diagnosed in 02/2019. She underwent left breast lumpectomy with SLNB on 03/10/19.  -Given her aggressive triple negative breast cancer, adjuvant chemo was recommended to reduce her risk of recurrence. I started her on dose denseAdriamycin and Cytoxan q2weeks for 4 cycles on 04/08/19 and plan to be followed by Taxol weeklyfor 12 weeksor every 2 weeks for4 cycles.  -She tolerated C3 poorly with severe fatigue, weakness, foggy brain and mild to moderate N&V. She did develop skin rash 2 days ago. Her side effects have improved now.  -Labs reviewed with increased anemia and mild hypocalcemia. I recommend she start OTC calcium or Vit D. No need for blood transfusion today. Overall adequate to proceed with [facility] today with dose reduction by 25% She is agreeable.  -She was billed her portion of treatment as $700. She is concerned about Taxol cost and how long she wants that treatment to be. I recommend she f/u with her financial advocate and our financial office.  -F/u in 3 weeks with start of Taxol. Due to her poor tolerance to chemo, I recommend weekly Taxol over every 2 weeks, she will she her copay for the chemo then make a decision.   2. Multiple  Skin lesionsin chest, Recent Skin rash of left chest -She has multiple moles and skin lesions of her b/l breasts and chest. The largest skin lesion on right breast measuring 4x5cm.  -patient previously reports seeing a dermatologist who says her skin lesions are benign. I have requested path from [place].  -She also developed skin rash 2 days ago (12/7) with itching but is starting to improve. This is likely related to one of chemo drugs. Dose will be reduced with final cycle. I recommend she use Claritin or benadryl to help.   3. Genetic Testing  -Her mother had colon cancer and father had bladder cancer, her MGF had cancer and her maternal great grandmother had breast cancer.  -I will check with Genetics to see if she is eligible for testing. She is interested in insurance covers it.  4. Mild anemia  -Secondary to chemo. Will monitor  -Hg decreased to 8.8 today (05/20/19). No need for anemia today. Will dose reduce her last dose AC.  -I encouraged her to start multivitamin.   5. Acid reflux  -Does cause her to cough.  -Continue Prilosec OTC.   6. Nausea and Vomiting, Loss of appetite, weight loss  -S/p C1 AC -This was managed with antiemetics and Tums. I reviewed use of Compazine and Zofran with her.  -Her nausea worsened with C3 but has been mostly controlled with antiemetics.  -She has no appetite but has been forcing herself to eat. Her weight has trended down slightly. I encouraged her to continue to eat adequately   7. Financial Support -She notes she received bill for treatment and after insurance it was charged $700.  -She is not sure how much her Taxol treatment will be so if expensive she rather do 4 treatment but if not she rather do weekly which is more tolerable.  -I recommend she f/u with her financial advocate and our financial office.    PLAN: -Labs reviewed and adequate to proceed with C4 AC today with dose reduction by 25% due to her poor  tolerance to last cycle chemo. No GCSF with this cycle  -IV Fluids today with  553m NS today -Lab, flush, f/u and chemo Taxol weekly X8 starting in 3 weeks. She will call our financial advocate and her insurance, if her out-of-pocket cost is high for Taxol, she may want to do it every 2 weeks for 4 cycles. -f/u in 3 weeks    No problem-specific Assessment & Plan notes found for this encounter.   No orders of the defined types were placed in this encounter.  All questions were answered. The patient knows to call the clinic with any problems, questions or concerns. No barriers to learning was detected. I spent 20 minutes counseling the patient face to face. The total time spent in the appointment was 25 minutes and more than 50% was on counseling and review of test results     YTruitt Merle MD 05/20/2019   I, AJoslyn Devon am acting as scribe for YTruitt Merle MD.   I have reviewed the above documentation for accuracy and completeness, and I agree with the above.

## 2019-05-20 ENCOUNTER — Encounter: Payer: PPO | Admitting: Genetic Counselor

## 2019-05-20 ENCOUNTER — Inpatient Hospital Stay: Payer: PPO | Attending: Hematology

## 2019-05-20 ENCOUNTER — Inpatient Hospital Stay: Payer: PPO

## 2019-05-20 ENCOUNTER — Other Ambulatory Visit: Payer: Self-pay

## 2019-05-20 ENCOUNTER — Encounter: Payer: Self-pay | Admitting: Hematology

## 2019-05-20 ENCOUNTER — Inpatient Hospital Stay (HOSPITAL_BASED_OUTPATIENT_CLINIC_OR_DEPARTMENT_OTHER): Payer: PPO | Admitting: Hematology

## 2019-05-20 VITALS — BP 146/77 | HR 127 | Temp 98.5°F | Resp 19 | Ht 64.0 in | Wt 157.3 lb

## 2019-05-20 VITALS — HR 102

## 2019-05-20 DIAGNOSIS — F329 Major depressive disorder, single episode, unspecified: Secondary | ICD-10-CM | POA: Insufficient documentation

## 2019-05-20 DIAGNOSIS — T451X5A Adverse effect of antineoplastic and immunosuppressive drugs, initial encounter: Secondary | ICD-10-CM | POA: Insufficient documentation

## 2019-05-20 DIAGNOSIS — Z803 Family history of malignant neoplasm of breast: Secondary | ICD-10-CM | POA: Insufficient documentation

## 2019-05-20 DIAGNOSIS — R5383 Other fatigue: Secondary | ICD-10-CM | POA: Diagnosis not present

## 2019-05-20 DIAGNOSIS — D701 Agranulocytosis secondary to cancer chemotherapy: Secondary | ICD-10-CM | POA: Diagnosis not present

## 2019-05-20 DIAGNOSIS — R634 Abnormal weight loss: Secondary | ICD-10-CM | POA: Insufficient documentation

## 2019-05-20 DIAGNOSIS — K219 Gastro-esophageal reflux disease without esophagitis: Secondary | ICD-10-CM | POA: Diagnosis not present

## 2019-05-20 DIAGNOSIS — Z800 Family history of malignant neoplasm of digestive organs: Secondary | ICD-10-CM | POA: Diagnosis not present

## 2019-05-20 DIAGNOSIS — Z8052 Family history of malignant neoplasm of bladder: Secondary | ICD-10-CM | POA: Insufficient documentation

## 2019-05-20 DIAGNOSIS — R630 Anorexia: Secondary | ICD-10-CM | POA: Diagnosis not present

## 2019-05-20 DIAGNOSIS — R21 Rash and other nonspecific skin eruption: Secondary | ICD-10-CM | POA: Diagnosis not present

## 2019-05-20 DIAGNOSIS — R112 Nausea with vomiting, unspecified: Secondary | ICD-10-CM | POA: Insufficient documentation

## 2019-05-20 DIAGNOSIS — R531 Weakness: Secondary | ICD-10-CM | POA: Insufficient documentation

## 2019-05-20 DIAGNOSIS — I700 Atherosclerosis of aorta: Secondary | ICD-10-CM | POA: Diagnosis not present

## 2019-05-20 DIAGNOSIS — Z5111 Encounter for antineoplastic chemotherapy: Secondary | ICD-10-CM | POA: Insufficient documentation

## 2019-05-20 DIAGNOSIS — C50212 Malignant neoplasm of upper-inner quadrant of left female breast: Secondary | ICD-10-CM

## 2019-05-20 DIAGNOSIS — D6481 Anemia due to antineoplastic chemotherapy: Secondary | ICD-10-CM | POA: Insufficient documentation

## 2019-05-20 DIAGNOSIS — Z95828 Presence of other vascular implants and grafts: Secondary | ICD-10-CM

## 2019-05-20 DIAGNOSIS — Z171 Estrogen receptor negative status [ER-]: Secondary | ICD-10-CM

## 2019-05-20 DIAGNOSIS — R918 Other nonspecific abnormal finding of lung field: Secondary | ICD-10-CM | POA: Diagnosis not present

## 2019-05-20 DIAGNOSIS — Z79899 Other long term (current) drug therapy: Secondary | ICD-10-CM | POA: Insufficient documentation

## 2019-05-20 LAB — CBC WITH DIFFERENTIAL (CANCER CENTER ONLY)
Abs Immature Granulocytes: 0.71 10*3/uL — ABNORMAL HIGH (ref 0.00–0.07)
Basophils Absolute: 0.1 10*3/uL (ref 0.0–0.1)
Basophils Relative: 1 %
Eosinophils Absolute: 0 10*3/uL (ref 0.0–0.5)
Eosinophils Relative: 0 %
HCT: 26 % — ABNORMAL LOW (ref 36.0–46.0)
Hemoglobin: 8.8 g/dL — ABNORMAL LOW (ref 12.0–15.0)
Immature Granulocytes: 7 %
Lymphocytes Relative: 10 %
Lymphs Abs: 1 10*3/uL (ref 0.7–4.0)
MCH: 30.8 pg (ref 26.0–34.0)
MCHC: 33.8 g/dL (ref 30.0–36.0)
MCV: 90.9 fL (ref 80.0–100.0)
Monocytes Absolute: 1.5 10*3/uL — ABNORMAL HIGH (ref 0.1–1.0)
Monocytes Relative: 14 %
Neutro Abs: 7.1 10*3/uL (ref 1.7–7.7)
Neutrophils Relative %: 68 %
Platelet Count: 210 10*3/uL (ref 150–400)
RBC: 2.86 MIL/uL — ABNORMAL LOW (ref 3.87–5.11)
RDW: 12.7 % (ref 11.5–15.5)
WBC Count: 10.3 10*3/uL (ref 4.0–10.5)
nRBC: 0.4 % — ABNORMAL HIGH (ref 0.0–0.2)

## 2019-05-20 LAB — CMP (CANCER CENTER ONLY)
ALT: 12 U/L (ref 0–44)
AST: 12 U/L — ABNORMAL LOW (ref 15–41)
Albumin: 3.9 g/dL (ref 3.5–5.0)
Alkaline Phosphatase: 67 U/L (ref 38–126)
Anion gap: 8 (ref 5–15)
BUN: 12 mg/dL (ref 8–23)
CO2: 26 mmol/L (ref 22–32)
Calcium: 8.8 mg/dL — ABNORMAL LOW (ref 8.9–10.3)
Chloride: 105 mmol/L (ref 98–111)
Creatinine: 0.76 mg/dL (ref 0.44–1.00)
GFR, Est AFR Am: >60 mL/min (ref >60)
GFR, Estimated: >60 mL/min (ref >60)
Glucose, Bld: 113 mg/dL — ABNORMAL HIGH (ref 70–99)
Potassium: 4.1 mmol/L (ref 3.5–5.1)
Sodium: 139 mmol/L (ref 135–145)
Total Bilirubin: <0.2 mg/dL — ABNORMAL LOW (ref 0.3–1.2)
Total Protein: 6.4 g/dL — ABNORMAL LOW (ref 6.5–8.1)

## 2019-05-20 MED ORDER — PALONOSETRON HCL INJECTION 0.25 MG/5ML
0.2500 mg | Freq: Once | INTRAVENOUS | Status: AC
  Administered 2019-05-20: 11:00:00 0.25 mg via INTRAVENOUS

## 2019-05-20 MED ORDER — DOXORUBICIN HCL CHEMO IV INJECTION 2 MG/ML
45.0000 mg/m2 | Freq: Once | INTRAVENOUS | Status: AC
  Administered 2019-05-20: 13:00:00 82 mg via INTRAVENOUS
  Filled 2019-05-20: qty 41

## 2019-05-20 MED ORDER — SODIUM CHLORIDE 0.9% FLUSH
10.0000 mL | INTRAVENOUS | Status: DC | PRN
  Administered 2019-05-20: 10:00:00 10 mL via INTRAVENOUS
  Filled 2019-05-20: qty 10

## 2019-05-20 MED ORDER — SODIUM CHLORIDE 0.9 % IV SOLN
Freq: Once | INTRAVENOUS | Status: AC
  Administered 2019-05-20: 11:00:00 via INTRAVENOUS
  Filled 2019-05-20: qty 5

## 2019-05-20 MED ORDER — PALONOSETRON HCL INJECTION 0.25 MG/5ML
INTRAVENOUS | Status: AC
  Filled 2019-05-20: qty 5

## 2019-05-20 MED ORDER — HEPARIN SOD (PORK) LOCK FLUSH 100 UNIT/ML IV SOLN
500.0000 [IU] | Freq: Once | INTRAVENOUS | Status: AC | PRN
  Administered 2019-05-20: 500 [IU]
  Filled 2019-05-20: qty 5

## 2019-05-20 MED ORDER — SODIUM CHLORIDE 0.9 % IV SOLN
450.0000 mg/m2 | Freq: Once | INTRAVENOUS | Status: AC
  Administered 2019-05-20: 820 mg via INTRAVENOUS
  Filled 2019-05-20: qty 41

## 2019-05-20 MED ORDER — SODIUM CHLORIDE 0.9% FLUSH
10.0000 mL | INTRAVENOUS | Status: DC | PRN
  Administered 2019-05-20: 14:00:00 10 mL
  Filled 2019-05-20: qty 10

## 2019-05-20 MED ORDER — SODIUM CHLORIDE 0.9 % IV SOLN
Freq: Once | INTRAVENOUS | Status: AC
  Administered 2019-05-20: 11:00:00 via INTRAVENOUS
  Filled 2019-05-20: qty 250

## 2019-05-20 MED ORDER — SODIUM CHLORIDE 0.9 % IV SOLN
INTRAVENOUS | Status: DC
  Administered 2019-05-20: 12:00:00 via INTRAVENOUS
  Filled 2019-05-20 (×2): qty 250

## 2019-05-20 MED ORDER — SODIUM CHLORIDE 0.9 % IV SOLN
INTRAVENOUS | Status: DC
  Filled 2019-05-20: qty 250

## 2019-05-20 NOTE — Progress Notes (Signed)
Per Dr Burr Medico ok to tx with pulse 102 today. Per Dr Burr Medico pt is to not receive Fulphila injection D3 this cycle.

## 2019-05-20 NOTE — Patient Instructions (Signed)
[facility] Discharge Instructions for Patients Receiving Chemotherapy  Today you received the following chemotherapy agents: Adriamycin, Cytoxan  To help prevent nausea and vomiting after your treatment, we encourage you to take your nausea medication as directed.   If you develop nausea and vomiting that is not controlled by your nausea medication, call the clinic.   BELOW ARE SYMPTOMS THAT SHOULD BE REPORTED IMMEDIATELY:  *FEVER GREATER THAN 100.5 F  *CHILLS WITH OR WITHOUT FEVER  NAUSEA AND VOMITING THAT IS NOT CONTROLLED WITH YOUR NAUSEA MEDICATION  *UNUSUAL SHORTNESS OF BREATH  *UNUSUAL BRUISING OR BLEEDING  TENDERNESS IN MOUTH AND THROAT WITH OR WITHOUT PRESENCE OF ULCERS  *URINARY PROBLEMS  *BOWEL PROBLEMS  UNUSUAL RASH Items with * indicate a potential emergency and should be followed up as soon as possible.  Feel free to call the clinic should you have any questions or concerns. The clinic phone number is [phone].  Please show the CHEMO ALERT CARD at check-in to the Emergency Department and triage nurse.   

## 2019-05-21 ENCOUNTER — Telehealth: Payer: Self-pay | Admitting: Hematology

## 2019-05-21 NOTE — Telephone Encounter (Signed)
Scheduled appt per 12/9 los.  Spoke with pt and scheduled appts per patient preferences.  She is aware of her scheduled appt dates and time.

## 2019-05-29 ENCOUNTER — Encounter (HOSPITAL_COMMUNITY): Payer: PPO | Admitting: Internal Medicine

## 2019-05-29 ENCOUNTER — Other Ambulatory Visit (HOSPITAL_COMMUNITY): Payer: PPO

## 2019-06-01 ENCOUNTER — Other Ambulatory Visit: Payer: Self-pay

## 2019-06-01 ENCOUNTER — Telehealth: Payer: Self-pay

## 2019-06-01 DIAGNOSIS — Z171 Estrogen receptor negative status [ER-]: Secondary | ICD-10-CM

## 2019-06-01 DIAGNOSIS — C50212 Malignant neoplasm of upper-inner quadrant of left female breast: Secondary | ICD-10-CM

## 2019-06-01 NOTE — Telephone Encounter (Signed)
Patient calls stating that last week she stopped eating for about 4 days, on Saturday had episode where she blacked out for a few seconds, did not get hurt, yesterday felt better, drinking Ensures, this morning actually ate breakfast.  Was told by Dr. Burr Medico that she may need a blood transfusion and she is wondering if that is what's going on.  She will come in on Tuesday 12/22 for lab/PF and to see Sandi Mealy PA in [facility].  Appointments were made.

## 2019-06-02 ENCOUNTER — Other Ambulatory Visit: Payer: Self-pay

## 2019-06-02 ENCOUNTER — Inpatient Hospital Stay: Payer: PPO

## 2019-06-02 ENCOUNTER — Inpatient Hospital Stay (HOSPITAL_BASED_OUTPATIENT_CLINIC_OR_DEPARTMENT_OTHER): Payer: PPO | Admitting: Medical

## 2019-06-02 VITALS — BP 116/62 | HR 90 | Temp 98.3°F | Resp 18 | Ht 64.0 in | Wt 156.4 lb

## 2019-06-02 DIAGNOSIS — D701 Agranulocytosis secondary to cancer chemotherapy: Secondary | ICD-10-CM

## 2019-06-02 DIAGNOSIS — C50212 Malignant neoplasm of upper-inner quadrant of left female breast: Secondary | ICD-10-CM

## 2019-06-02 DIAGNOSIS — Z171 Estrogen receptor negative status [ER-]: Secondary | ICD-10-CM

## 2019-06-02 DIAGNOSIS — Z95828 Presence of other vascular implants and grafts: Secondary | ICD-10-CM

## 2019-06-02 DIAGNOSIS — D649 Anemia, unspecified: Secondary | ICD-10-CM | POA: Diagnosis not present

## 2019-06-02 DIAGNOSIS — T451X5A Adverse effect of antineoplastic and immunosuppressive drugs, initial encounter: Secondary | ICD-10-CM

## 2019-06-02 DIAGNOSIS — Z5111 Encounter for antineoplastic chemotherapy: Secondary | ICD-10-CM | POA: Diagnosis not present

## 2019-06-02 LAB — CBC WITH DIFFERENTIAL (CANCER CENTER ONLY)
Abs Immature Granulocytes: 0 10*3/uL (ref 0.00–0.07)
Basophils Absolute: 0 10*3/uL (ref 0.0–0.1)
Basophils Relative: 1 %
Eosinophils Absolute: 0 10*3/uL (ref 0.0–0.5)
Eosinophils Relative: 0 %
HCT: 20.8 % — ABNORMAL LOW (ref 36.0–46.0)
Hemoglobin: 6.9 g/dL — CL (ref 12.0–15.0)
Immature Granulocytes: 0 %
Lymphocytes Relative: 35 %
Lymphs Abs: 0.3 10*3/uL — ABNORMAL LOW (ref 0.7–4.0)
MCH: 30.8 pg (ref 26.0–34.0)
MCHC: 33.2 g/dL (ref 30.0–36.0)
MCV: 92.9 fL (ref 80.0–100.0)
Monocytes Absolute: 0.4 10*3/uL (ref 0.1–1.0)
Monocytes Relative: 49 %
Neutro Abs: 0.1 10*3/uL — CL (ref 1.7–7.7)
Neutrophils Relative %: 15 %
Platelet Count: 102 10*3/uL — ABNORMAL LOW (ref 150–400)
RBC: 2.24 MIL/uL — ABNORMAL LOW (ref 3.87–5.11)
RDW: 14.6 % (ref 11.5–15.5)
WBC Count: 0.8 10*3/uL — CL (ref 4.0–10.5)
nRBC: 0 % (ref 0.0–0.2)

## 2019-06-02 LAB — CMP (CANCER CENTER ONLY)
ALT: 12 U/L (ref 0–44)
AST: 12 U/L — ABNORMAL LOW (ref 15–41)
Albumin: 3.4 g/dL — ABNORMAL LOW (ref 3.5–5.0)
Alkaline Phosphatase: 45 U/L (ref 38–126)
Anion gap: 10 (ref 5–15)
BUN: 11 mg/dL (ref 8–23)
CO2: 28 mmol/L (ref 22–32)
Calcium: 8.8 mg/dL — ABNORMAL LOW (ref 8.9–10.3)
Chloride: 100 mmol/L (ref 98–111)
Creatinine: 0.83 mg/dL (ref 0.44–1.00)
GFR, Est AFR Am: >60 mL/min (ref >60)
GFR, Estimated: >60 mL/min (ref >60)
Glucose, Bld: 141 mg/dL — ABNORMAL HIGH (ref 70–99)
Potassium: 3.9 mmol/L (ref 3.5–5.1)
Sodium: 138 mmol/L (ref 135–145)
Total Bilirubin: 0.2 mg/dL — ABNORMAL LOW (ref 0.3–1.2)
Total Protein: 6.2 g/dL — ABNORMAL LOW (ref 6.5–8.1)

## 2019-06-02 LAB — ABO/RH: ABO/RH(D): O POS

## 2019-06-02 LAB — SAMPLE TO BLOOD BANK

## 2019-06-02 LAB — PREPARE RBC (CROSSMATCH)

## 2019-06-02 MED ORDER — SODIUM CHLORIDE 0.9% FLUSH
10.0000 mL | INTRAVENOUS | Status: DC | PRN
  Administered 2019-06-02: 10 mL via INTRAVENOUS
  Filled 2019-06-02: qty 10

## 2019-06-02 MED ORDER — HEPARIN SOD (PORK) LOCK FLUSH 100 UNIT/ML IV SOLN
250.0000 [IU] | INTRAVENOUS | Status: AC | PRN
  Administered 2019-06-02: 500 [IU]
  Filled 2019-06-02: qty 5

## 2019-06-02 MED ORDER — ACETAMINOPHEN 325 MG PO TABS
ORAL_TABLET | ORAL | Status: AC
  Filled 2019-06-02: qty 2

## 2019-06-02 MED ORDER — DIPHENHYDRAMINE HCL 25 MG PO CAPS
ORAL_CAPSULE | ORAL | Status: AC
  Filled 2019-06-02: qty 1

## 2019-06-02 MED ORDER — SODIUM CHLORIDE 0.9% FLUSH
10.0000 mL | Freq: Once | INTRAVENOUS | Status: AC
  Administered 2019-06-02: 10 mL
  Filled 2019-06-02: qty 10

## 2019-06-02 MED ORDER — SODIUM CHLORIDE 0.9% IV SOLUTION
250.0000 mL | Freq: Once | INTRAVENOUS | Status: AC
  Administered 2019-06-02: 250 mL via INTRAVENOUS
  Filled 2019-06-02: qty 250

## 2019-06-02 MED ORDER — ACETAMINOPHEN 325 MG PO TABS
650.0000 mg | ORAL_TABLET | Freq: Once | ORAL | Status: AC
  Administered 2019-06-02: 650 mg via ORAL

## 2019-06-02 MED ORDER — TBO-FILGRASTIM 300 MCG/0.5ML ~~LOC~~ SOSY
PREFILLED_SYRINGE | SUBCUTANEOUS | Status: AC
  Filled 2019-06-02: qty 0.5

## 2019-06-02 MED ORDER — DIPHENHYDRAMINE HCL 25 MG PO CAPS
25.0000 mg | ORAL_CAPSULE | Freq: Once | ORAL | Status: AC
  Administered 2019-06-02: 25 mg via ORAL

## 2019-06-02 MED ORDER — TBO-FILGRASTIM 300 MCG/0.5ML ~~LOC~~ SOSY
300.0000 ug | PREFILLED_SYRINGE | Freq: Once | SUBCUTANEOUS | Status: AC
  Administered 2019-06-02: 300 ug via SUBCUTANEOUS

## 2019-06-02 NOTE — Progress Notes (Signed)
Pt received 2 units PRBCs today, tolerated well.  VSS.  Able to eat and drink during transfusion w/out any issues.  Pt aware of appts tomorrow (06/03/2019).

## 2019-06-02 NOTE — Addendum Note (Signed)
Addended by: Caryl Comes E on: 06/02/2019 03:36 PM   Modules accepted: Orders

## 2019-06-02 NOTE — Patient Instructions (Addendum)
Tbo-Filgrastim injection What is this medicine? TBO-FILGRASTIM (T B O fil GRA stim) is a granulocyte colony-stimulating factor that helps you make more neutrophils, a type of white blood cell. Neutrophils are important for fighting infections. Some chemotherapy affects your bone marrow and lowers your neutrophils. This medicine helps decrease the length of time that neutrophils are very low (severe neutropenia). This medicine may be used for other purposes; ask your health care provider or pharmacist if you have questions. COMMON BRAND NAME(S): Granix What should I tell my health care provider before I take this medicine? They need to know if you have any of these conditions:  bone scan or tests planned  kidney disease  sickle cell anemia  an unusual or allergic reaction to tbo-filgrastim, filgrastim, pegfilgrastim, other medicines, foods, dyes, or preservatives  pregnant or trying to get pregnant  breast-feeding How should I use this medicine? This medicine is for injection under the skin. If you get this medicine at home, you will be taught how to prepare and give this medicine. Refer to the Instructions for Use that come with your medication packaging. Use exactly as directed. Take your medicine at regular intervals. Do not take your medicine more often than directed. It is important that you put your used needles and syringes in a special sharps container. Do not put them in a trash can. If you do not have a sharps container, call your pharmacist or healthcare provider to get one. Talk to your pediatrician regarding the use of this medicine in children. While this drug may be prescribed for children as young as 39 month of age for selected conditions, precautions do apply. Overdosage: If you think you have taken too much of this medicine contact a poison control center or emergency room at once. NOTE: This medicine is only for you. Do not share this medicine with others. What if I miss a  dose? It is important not to miss your dose. Call your doctor or health care professional if you miss a dose. What may interact with this medicine? This medicine may interact with the following medications:  medicines that may cause a release of neutrophils, such as lithium This list may not describe all possible interactions. Give your health care provider a list of all the medicines, herbs, non-prescription drugs, or dietary supplements you use. Also tell them if you smoke, drink alcohol, or use illegal drugs. Some items may interact with your medicine. What should I watch for while using this medicine? You may need blood work done while you are taking this medicine. What side effects may I notice from receiving this medicine? Side effects that you should report to your doctor or health care professional as soon as possible:  allergic reactions like skin rash, itching or hives, swelling of the face, lips, or tongue  back pain  blood in the urine  dark urine  dizziness  fast heartbeat  feeling faint  shortness of breath or breathing problems  signs and symptoms of infection like fever or chills; cough; or sore throat  signs and symptoms of kidney injury like trouble passing urine or change in the amount of urine  stomach or side pain, or pain at the shoulder  sweating  swelling of the legs, ankles, or abdomen  tiredness Side effects that usually do not require medical attention (report to your doctor or health care professional if they continue or are bothersome):  bone pain  diarrhea  headache  muscle pain  vomiting This list may  not describe all possible side effects. Call your doctor for medical advice about side effects. You may report side effects to FDA at 1-800-FDA-1088. Where should I keep my medicine? Keep out of the reach of children. Store in a refrigerator between 2 and 8 degrees C (36 and 46 degrees F). Keep in carton to protect from light. Throw  away this medicine if it is left out of the refrigerator for more than 5 consecutive days. Throw away any unused medicine after the expiration date. NOTE: This sheet is a summary. It may not cover all possible information. If you have questions about this medicine, talk to your doctor, pharmacist, or health care provider.  2020 Elsevier/Gold Standard (2018-03-29 19:58:39)   Blood Transfusion, Adult, Care After This sheet gives you information about how to care for yourself after your procedure. Your doctor may also give you more specific instructions. If you have problems or questions, contact your doctor. Follow these instructions at home:   Take over-the-counter and prescription medicines only as told by your doctor.  Go back to your normal activities as told by your doctor.  Follow instructions from your doctor about how to take care of the area where an IV tube was put into your vein (insertion site). Make sure you: ? Wash your hands with soap and water before you change your bandage (dressing). If there is no soap and water, use hand sanitizer. ? Change your bandage as told by your doctor.  Check your IV insertion site every day for signs of infection. Check for: ? More redness, swelling, or pain. ? More fluid or blood. ? Warmth. ? Pus or a bad smell. Contact a doctor if:  You have more redness, swelling, or pain around the IV insertion site.  You have more fluid or blood coming from the IV insertion site.  Your IV insertion site feels warm to the touch.  You have pus or a bad smell coming from the IV insertion site.  Your pee (urine) turns pink, red, or brown.  You feel weak after doing your normal activities. Get help right away if:  You have signs of a serious allergic or body defense (immune) system reaction, including: ? Itchiness. ? Hives. ? Trouble breathing. ? Anxiety. ? Pain in your chest or lower back. ? Fever, flushing, and chills. ? Fast  pulse. ? Rash. ? Watery poop (diarrhea). ? Throwing up (vomiting). ? Dark pee. ? Serious headache. ? Dizziness. ? Stiff neck. ? Yellow color in your face or the white parts of your eyes (jaundice). Summary  After a blood transfusion, return to your normal activities as told by your doctor.  Every day, check for signs of infection where the IV tube was put into your vein.  Some signs of infection are warm skin, more redness and pain, more fluid or blood, and pus or a bad smell where the needle went in.  Contact your doctor if you feel weak or have any unusual symptoms. This information is not intended to replace advice given to you by your health care provider. Make sure you discuss any questions you have with your health care provider. Document Released: 06/18/2014 Document Revised: 10/02/2017 Document Reviewed: 01/20/2016 Elsevier Patient Education  2020 [employer].

## 2019-06-02 NOTE — Progress Notes (Signed)
[facility] Progress Note   TEREZ BALAZS [id number] 08-07-1950 68 y.o.  TRINE STAPLEFORD is managed by Dr. Truitt Merle  Actively treated with chemotherapy/immunotherapy/hormonal therapy: yes  Current therapy: Adriamycin and Cytoxan  Last treated: 05/20/2019 (cycle #4, day #1)  Next scheduled appointment with provider: 06/09/2019 (treatment only)  Assessment: Plan:    Symptomatic anemia - Plan: Practitioner attestation of consent, Complete patient signature process for consent form, Type and screen, Care order/instruction, 0.9 %  sodium chloride infusion (Manually program via Guardrails IV Fluids), heparin lock flush 100 unit/mL, Prepare RBC, Transfuse RBC, acetaminophen (TYLENOL) tablet 650 mg, diphenhydrAMINE (BENADRYL) capsule 25 mg, Prepare RBC, Type and screen, Care order/instruction, Complete patient signature process for consent form, Practitioner attestation of consent, Transfuse RBC  Chemotherapy-induced neutropenia ([place]) - Plan: Tbo-Filgrastim (GRANIX) injection 300 mcg  Malignant neoplasm of upper-inner quadrant of left breast in female, estrogen receptor negative ([place])   Symptomatic anemia: A CBC returned with a Hgb of 6.7 today.  Ms. Florencio was given 2 units of PRBC's today.  Chemotherapy induced neutropenia: A CBC returned with a WBC of 0.8 and an ANC of 0.1 today. She has not had a fever and shows no clinical evidence of an infection. She was given Granix 300 micrograms subcutaneous today and will return tomorrow for a repeat CBC and possible additional dosing with Granix. She was told to start daily Claritin.   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 Pershing is status post cycle #4, day #1 of Adriamycin and Cytoxan which was last dosed on 05/20/2019. She is scheduled to return on 06/09/2019 to begin paclitaxel.   Please see After Visit Summary for patient specific instructions.  Future Appointments  Date Time Provider [place]  06/09/2019 12:30 PM [facility] LAB 5 [facility] None  06/09/2019 12:45 PM [facility] [place] FLUSH [facility] None  06/09/2019  1:30 PM [facility] INFUSION [facility] None  06/17/2019 12:15 PM [facility] LAB 5 [facility] None  06/17/2019 12:30 PM [facility] FLUSH [facility] None  06/17/2019  1:15 PM [facility] INFUSION [facility] None  06/24/2019 12:15 PM [facility] LAB 3 [facility] None  06/24/2019 12:30 PM [facility] [place] FLUSH [facility] None  06/24/2019  1:15 PM [facility] INFUSION [facility] None  07/01/2019 12:00 PM [facility] LAB 3 [facility] None  07/01/2019 12:15 PM [facility] [place] FLUSH [facility] None  07/01/2019  1:00 PM [facility] INFUSION [facility] None  07/08/2019 12:15 PM [facility] LAB 5 [facility] None  07/08/2019 12:30 PM [facility] [place] FLUSH [facility] None  07/08/2019  1:15 PM [facility] INFUSION [facility] None  07/15/2019 12:00 PM [facility] LAB 3 [facility] None  07/15/2019 12:15 PM [facility] FLUSH [facility] None  07/15/2019  1:00 PM [facility] INFUSION [facility] None  07/22/2019 11:30 AM [facility] LAB 3 [facility] None  07/22/2019 11:45 AM [facility] FLUSH [facility] None  07/22/2019 12:30 PM [facility] INFUSION [facility] None  07/29/2019 11:30 AM [facility] LAB 4 [facility] None  07/29/2019 11:45 AM [facility] [place] FLUSH [facility] None  07/29/2019 12:30 PM [facility] INFUSION [facility] None  03/21/2020  1:20 PM Mosie Lukes, MD [facility]    Orders Placed This Encounter  Procedures  . Practitioner attestation of consent  . Complete patient signature process for consent form  . Care order/instruction  . Type and screen  . Prepare RBC  . ABO/Rh       Subjective:   Patient ID:  EARIE MCMAINS is a 68 y.o. (DOB Sep 03, 1950) female.  Chief Complaint:  Chief Complaint  Patient presents with  .  Fatigue    HPI CLORIA FOIST Is a 68 y.o. female with a diagnosis of an ER negative malignant neoplasm of the left breast. She is managed  by Dr. Burr Medico and is status post cycle #4, day #1 of Adriamycin and Cytoxan which was last dosed on 05/20/2019. Cycle #4 was given with a dose reduction and without Neulasta. She reports that she did not eat anything and drank little except for Ensure from last Wednesday to Saturday. She has fatigue, generalized weakness, and lightheadedness. She had mild diarrhea with nausea and vomiting which has resolved since Saturday. She had a syncopal episode on Saturday and fell at home. She was about to break her fall but did suffer an abrasion to her right lateral skull and has mild soreness in her right ribs and ankle. She denies oral pain or mucosal breakdown, fevers, chills, or sweats. She is scheduled to return on 06/09/2019 to begin paclitaxel. A CBC returned today with a Hgb of 6.7, WBC of 0.8 and an ANC of 0.1.   Medications: I have reviewed the patient's current medications.  Allergies:  Allergies  Allergen Reactions  . Bromfed Shortness Of Breath  . Venlafaxine     Doesn't remember reaction    Past Medical History:  Diagnosis Date  . Allergy   . Depression    ? if bipolar works the best  . Elevated fasting blood sugar    hx  . H/O measles   . Headache   . History of chicken pox   . Hx of migraines   . Microscopic hematuria    advised to see urology ? never went  . Preventative health care 06/20/2011    Past Surgical History:  Procedure Laterality Date  . BREAST LUMPECTOMY WITH RADIOACTIVE SEED AND SENTINEL LYMPH NODE BIOPSY Left 03/10/2019   Procedure: LEFT BREAST LUMPECTOMY WITH RADIOACTIVE SEED AND LEFT SENTINEL LYMPH NODE MAPPING;  Surgeon: Erroll Luna, MD;  Location: [place];  Service: General;  Laterality: Left;  . endometrial polyps     2 surgeries by Dr Cathlean Cower  . EYE SURGERY  04/29/2009   Rt eye tighten up  . PORTACATH PLACEMENT N/A 03/10/2019   Procedure: INSERTION PORT-A-CATH WITH ULTRASOUND;  Surgeon: Erroll Luna, MD;  Location: [facility];  Service: General;   Laterality: N/A;  . steel plate in skull Left    from a crushed skull    Family History  Problem Relation Age of Onset  . Alzheimer's disease Mother   . Cancer Mother 39       colon  . Thyroid disease Father   . COPD Father   . Cancer Father        bladder with cystectomy  . Cancer Maternal Grandfather        colon cancer  . Cancer Other        maternal grant granmother had breast cancer     Social History   Socioeconomic History  . Marital status: Single    Spouse name: Not on file  . Number of children: Not on file  . Years of education: Not on file  . Highest education level: Not on file  Occupational History  . Occupation: retired   Tobacco Use  . Smoking status: Former Smoker    Packs/day: 0.25    Years: 10.00    Pack years: 2.50  . Smokeless tobacco: Never Used  . Tobacco comment: social smoker in her 20-30's   Substance and Sexual Activity  . Alcohol use: Not  Currently    Alcohol/week: 1.0 - 2.0 standard drinks    Types: 1 - 2 Glasses of wine per week    Comment: very rare on social occasions  . Drug use: No  . Sexual activity: Not on file  Other Topics Concern  . Not on file  Social History Narrative   HH of 1   2 Cats   Caffeine 2 mugs   Exercise recently some, no major dietary restrictions at this time. Was vegan now eats chicken and poultry   Works at [employer] in [place] Strain:   . Difficulty of Paying Living Expenses: Not on file  Food Insecurity:   . Worried About Charity fundraiser in the Last Year: Not on file  . Ran Out of Food in the Last Year: Not on file  Transportation Needs:   . Lack of Transportation (Medical): Not on file  . Lack of Transportation (Non-Medical): Not on file  Physical Activity:   . Days of Exercise per Week: Not on file  . Minutes of Exercise per Session: Not on file  Stress:   . Feeling of Stress : Not on file  Social Connections:   . Frequency of  Communication with Friends and Family: Not on file  . Frequency of Social Gatherings with Friends and Family: Not on file  . Attends Religious Services: Not on file  . Active Member of Clubs or Organizations: Not on file  . Attends Archivist Meetings: Not on file  . Marital Status: Not on file  Intimate Partner Violence:   . Fear of Current or Ex-Partner: Not on file  . Emotionally Abused: Not on file  . Physically Abused: Not on file  . Sexually Abused: Not on file    Past Medical History, Surgical history, Social history, and Family history were reviewed and updated as appropriate.   Please see review of systems for further details on the patient's review from today.   Review of Systems:  Review of Systems  Constitutional: Positive for appetite change. Negative for chills, diaphoresis and fever.  HENT: Negative for dental problem, mouth sores and trouble swallowing.   Respiratory: Negative for cough, chest tightness and shortness of breath.   Cardiovascular: Negative for chest pain and palpitations.  Gastrointestinal: Negative for constipation, diarrhea, nausea and vomiting.  Genitourinary: Negative for difficulty urinating.  Neurological: Positive for dizziness, syncope and weakness. Negative for headaches.    Objective:   Physical Exam:  BP (!) 107/56 (BP Location: Right Arm, Patient Position: Sitting)   Pulse (!) 117   Temp 97.8 F (36.6 C) (Temporal)   Resp 17   Ht 5\' 4"  (1.626 m)   Wt 156 lb 6.4 oz (70.9 kg)   SpO2 98%   BMI 26.85 kg/m  ECOG: 1  Physical Exam Constitutional:      General: She is not in acute distress.    Appearance: She is not diaphoretic.  HENT:     Head: Normocephalic and atraumatic.     Mouth/Throat:     Mouth: Mucous membranes are moist.     Pharynx: Oropharynx is clear. No oropharyngeal exudate or posterior oropharyngeal erythema.  Eyes:     General: No scleral icterus.       Right eye: No discharge.        Left eye: No  discharge.  Cardiovascular:     Rate and Rhythm: Normal rate and regular rhythm.  Heart sounds: Normal heart sounds. No murmur. No friction rub. No gallop.   Pulmonary:     Effort: Pulmonary effort is normal. No respiratory distress.     Breath sounds: Normal breath sounds. No wheezing or rales.  Abdominal:     General: Bowel sounds are normal. There is no distension.     Tenderness: There is no abdominal tenderness. There is no guarding.  Skin:    General: Skin is warm and dry.     Findings: No erythema or rash.       Neurological:     Mental Status: She is alert.     Coordination: Coordination normal.     Gait: Gait normal.  Psychiatric:        Mood and Affect: Mood normal.        Behavior: Behavior normal.        Thought Content: Thought content normal.        Judgment: Judgment normal.     Lab Review:     Component Value Date/Time   NA 138 06/02/2019 1000   K 3.9 06/02/2019 1000   CL 100 06/02/2019 1000   CO2 28 06/02/2019 1000   GLUCOSE 141 (H) 06/02/2019 1000   BUN 11 06/02/2019 1000   CREATININE 0.83 06/02/2019 1000   CALCIUM 8.8 (L) 06/02/2019 1000   PROT 6.2 (L) 06/02/2019 1000   ALBUMIN 3.4 (L) 06/02/2019 1000   AST 12 (L) 06/02/2019 1000   ALT 12 06/02/2019 1000   ALKPHOS 45 06/02/2019 1000   BILITOT 0.2 (L) 06/02/2019 1000   GFRNONAA >60 06/02/2019 1000   GFRAA >60 06/02/2019 1000       Component Value Date/Time   WBC 0.8 (LL) 06/02/2019 1000   WBC 5.6 03/05/2019 1055   RBC 2.24 (L) 06/02/2019 1000   HGB 6.9 (LL) 06/02/2019 1000   HCT 20.8 (L) 06/02/2019 1000   PLT 102 (L) 06/02/2019 1000   MCV 92.9 06/02/2019 1000   MCH 30.8 06/02/2019 1000   MCHC 33.2 06/02/2019 1000   RDW 14.6 06/02/2019 1000   LYMPHSABS 0.3 (L) 06/02/2019 1000   MONOABS 0.4 06/02/2019 1000   EOSABS 0.0 06/02/2019 1000   BASOSABS 0.0 06/02/2019 1000   -------------------------------  Imaging from last 24 hours (if applicable):  Radiology  interpretation: ECHOCARDIOGRAM COMPLETE  Result Date: 05/15/2019   ECHOCARDIOGRAM REPORT   Patient Name:   RHONNA DRUGAN [facility] Date of Exam: 05/15/2019 Medical Rec #:  [id number]        Height:       64.0 in Accession #:    [id number]       Weight:       159.7 lb Date of Birth:  31-Dec-1950       BSA:          1.78 m Patient Age:    8 years         BP:           103/66 mmHg Patient Gender: F                HR:           91 bpm. Exam Location:  Outpatient Procedure: 2D Echo, Cardiac Doppler, Color Doppler and Strain Analysis Indications:    Z51.11 Encounter for antineoplastic chemotheraphy  History:        Patient has prior history of Echocardiogram examinations, most                 recent 03/26/2019.  Sonographer:    Jonelle Sidle Dance Referring Phys: [id number] DANIEL R [place]  1. Left ventricular ejection fraction, by visual estimation, is 65 to 70%. The left ventricle has normal function. There is no left ventricular hypertrophy.  2. Left ventricular diastolic parameters are consistent with Grade I diastolic dysfunction (impaired relaxation).  3. Global right ventricle has normal systolic function.The right ventricular size is normal. No increase in right ventricular wall thickness.  4. Left atrial size was normal.  5. Right atrial size was normal.  6. The mitral valve is normal in structure. Trace mitral valve regurgitation.  7. The tricuspid valve is normal in structure. Tricuspid valve regurgitation is not demonstrated.  8. The aortic valve is normal in structure. Aortic valve regurgitation is trivial. Mild aortic valve sclerosis without stenosis.  9. The pulmonic valve was normal in structure. Pulmonic valve regurgitation is not visualized. 10. The inferior vena cava is normal in size with greater than 50% respiratory variability, suggesting right atrial pressure of 3 mmHg. FINDINGS  Left Ventricle: Left ventricular ejection fraction, by visual estimation, is 65 to 70%. The left ventricle has normal  function. There is no left ventricular hypertrophy. Left ventricular diastolic parameters are consistent with Grade I diastolic dysfunction (impaired relaxation). Right Ventricle: The right ventricular size is normal. No increase in right ventricular wall thickness. Global RV systolic function is has normal systolic function. Left Atrium: Left atrial size was normal in size. Right Atrium: Right atrial size was normal in size Pericardium: There is no evidence of pericardial effusion. Mitral Valve: The mitral valve is normal in structure. Trace mitral valve regurgitation. Tricuspid Valve: The tricuspid valve is normal in structure. Tricuspid valve regurgitation is not demonstrated. Aortic Valve: The aortic valve is normal in structure. Aortic valve regurgitation is trivial. Mild aortic valve sclerosis is present, with no evidence of aortic valve stenosis. Pulmonic Valve: The pulmonic valve was normal in structure. Pulmonic valve regurgitation is not visualized. Aorta: The aortic root and ascending aorta are structurally normal, with no evidence of dilitation. Venous: The inferior vena cava is normal in size with greater than 50% respiratory variability, suggesting right atrial pressure of 3 mmHg. IAS/Shunts: No atrial level shunt detected by color flow Doppler.  LEFT VENTRICLE PLAX 2D LVIDd:         3.70 cm Diastology LVIDs:         2.60 cm LV e' lateral:   8.83 cm/s LV PW:         0.90 cm LV E/e' lateral: 9.5 LV IVS:        1.20 cm LV e' medial:    8.32 cm/s LV SV:         34 ml   LV E/e' medial:  10.1 LV SV Index:   18.35  RIGHT VENTRICLE             IVC RV Basal diam:  2.10 cm     IVC diam: 1.50 cm RV S prime:     12.90 cm/s TAPSE (M-mode): 2.1 cm LEFT ATRIUM             Index       RIGHT ATRIUM          Index LA diam:        3.20 cm 1.80 cm/m  RA Area:     8.71 cm LA Vol (A2C):   44.1 ml 24.80 ml/m RA Volume:   16.10 ml 9.06 ml/m LA Vol (A4C):   17.6 ml 9.90 ml/m  LA Biplane Vol: 30.4 ml 17.10 ml/m  AORTIC  VALVE LVOT Vmax:   104.00 cm/s LVOT Vmean:  74.300 cm/s LVOT VTI:    0.187 m  AORTA Ao Root diam: 3.40 cm Ao Asc diam:  2.90 cm MITRAL VALVE MV Area (PHT): 2.83 cm              SHUNTS MV PHT:        77.72 msec            Systemic VTI: 0.19 m MV Decel Time: 268 msec MV E velocity: 84.20 cm/s  103 cm/s MV A velocity: 107.00 cm/s 70.3 cm/s MV E/A ratio:  0.79        1.5  Glori Bickers MD Electronically signed by Glori Bickers MD Signature Date/Time: 05/15/2019/3:01:42 PM    Final

## 2019-06-02 NOTE — Progress Notes (Signed)
Critical lab values received 1012: WBC 0.8 Hgb 6.9 PA Van aware  Critical lab value received 1039: ANC 0.1 PA Van aware.

## 2019-06-02 NOTE — Patient Instructions (Signed)

## 2019-06-03 ENCOUNTER — Other Ambulatory Visit: Payer: Self-pay

## 2019-06-03 ENCOUNTER — Inpatient Hospital Stay: Payer: PPO | Admitting: Medical

## 2019-06-03 ENCOUNTER — Inpatient Hospital Stay (HOSPITAL_BASED_OUTPATIENT_CLINIC_OR_DEPARTMENT_OTHER): Payer: PPO | Admitting: Medical

## 2019-06-03 VITALS — BP 128/67 | HR 98 | Temp 98.2°F | Resp 18

## 2019-06-03 DIAGNOSIS — Z5111 Encounter for antineoplastic chemotherapy: Secondary | ICD-10-CM | POA: Diagnosis not present

## 2019-06-03 DIAGNOSIS — Z171 Estrogen receptor negative status [ER-]: Secondary | ICD-10-CM

## 2019-06-03 DIAGNOSIS — D701 Agranulocytosis secondary to cancer chemotherapy: Secondary | ICD-10-CM

## 2019-06-03 DIAGNOSIS — C50212 Malignant neoplasm of upper-inner quadrant of left female breast: Secondary | ICD-10-CM

## 2019-06-03 DIAGNOSIS — T451X5A Adverse effect of antineoplastic and immunosuppressive drugs, initial encounter: Secondary | ICD-10-CM

## 2019-06-03 LAB — CMP (CANCER CENTER ONLY)
ALT: 12 U/L (ref 0–44)
AST: 14 U/L — ABNORMAL LOW (ref 15–41)
Albumin: 3.6 g/dL (ref 3.5–5.0)
Alkaline Phosphatase: 51 U/L (ref 38–126)
Anion gap: 7 (ref 5–15)
BUN: 12 mg/dL (ref 8–23)
CO2: 28 mmol/L (ref 22–32)
Calcium: 9.2 mg/dL (ref 8.9–10.3)
Chloride: 105 mmol/L (ref 98–111)
Creatinine: 0.81 mg/dL (ref 0.44–1.00)
GFR, Est AFR Am: >60 mL/min (ref >60)
GFR, Estimated: >60 mL/min (ref >60)
Glucose, Bld: 115 mg/dL — ABNORMAL HIGH (ref 70–99)
Potassium: 4.9 mmol/L (ref 3.5–5.1)
Sodium: 140 mmol/L (ref 135–145)
Total Bilirubin: 0.3 mg/dL (ref 0.3–1.2)
Total Protein: 6.4 g/dL — ABNORMAL LOW (ref 6.5–8.1)

## 2019-06-03 LAB — CBC WITH DIFFERENTIAL (CANCER CENTER ONLY)
Abs Immature Granulocytes: 0.03 10*3/uL (ref 0.00–0.07)
Basophils Absolute: 0 10*3/uL (ref 0.0–0.1)
Basophils Relative: 0 %
Eosinophils Absolute: 0 10*3/uL (ref 0.0–0.5)
Eosinophils Relative: 0 %
HCT: 30.9 % — ABNORMAL LOW (ref 36.0–46.0)
Hemoglobin: 10.3 g/dL — ABNORMAL LOW (ref 12.0–15.0)
Immature Granulocytes: 1 %
Lymphocytes Relative: 10 %
Lymphs Abs: 0.5 10*3/uL — ABNORMAL LOW (ref 0.7–4.0)
MCH: 30.9 pg (ref 26.0–34.0)
MCHC: 33.3 g/dL (ref 30.0–36.0)
MCV: 92.8 fL (ref 80.0–100.0)
Monocytes Absolute: 1.1 10*3/uL — ABNORMAL HIGH (ref 0.1–1.0)
Monocytes Relative: 22 %
Neutro Abs: 3.4 10*3/uL (ref 1.7–7.7)
Neutrophils Relative %: 67 %
Platelet Count: 135 10*3/uL — ABNORMAL LOW (ref 150–400)
RBC: 3.33 MIL/uL — ABNORMAL LOW (ref 3.87–5.11)
RDW: 15.4 % (ref 11.5–15.5)
WBC Count: 5.1 10*3/uL (ref 4.0–10.5)
nRBC: 0 % (ref 0.0–0.2)

## 2019-06-03 LAB — TYPE AND SCREEN
ABO/RH(D): O POS
Antibody Screen: NEGATIVE
Unit division: 0
Unit division: 0
Unit division: 0

## 2019-06-03 LAB — BPAM RBC
Blood Product Expiration Date: 202101102359
Blood Product Expiration Date: 202101232359
Blood Product Expiration Date: 202101232359
ISSUE DATE / TIME: 202012221205
ISSUE DATE / TIME: 202012221205
Unit Type and Rh: 5100
Unit Type and Rh: 5100
Unit Type and Rh: 5100

## 2019-06-03 NOTE — Patient Instructions (Signed)
COVID-19: How to Protect Yourself and Others Know how it spreads  There is currently no vaccine to prevent coronavirus disease 2019 (COVID-19).  The best way to prevent illness is to avoid being exposed to this virus.  The virus is thought to spread mainly from person-to-person. ? Between people who are in close contact with one another (within about 6 feet). ? Through respiratory droplets produced when an infected person coughs, sneezes or talks. ? These droplets can land in the mouths or noses of people who are nearby or possibly be inhaled into the lungs. ? Some recent studies have suggested that COVID-19 may be spread by people who are not showing symptoms. Everyone should Clean your hands often  Wash your hands often with soap and water for at least 20 seconds especially after you have been in a public place, or after blowing your nose, coughing, or sneezing.  If soap and water are not readily available, use a hand sanitizer that contains at least 60% alcohol. Cover all surfaces of your hands and rub them together until they feel dry.  Avoid touching your eyes, nose, and mouth with unwashed hands. Avoid close contact  Stay home if you are sick.  Avoid close contact with people who are sick.  Put distance between yourself and other people. ? Remember that some people without symptoms may be able to spread virus. ? This is especially important for people who are at higher risk of getting very sick.www.cdc.gov/coronavirus/2019-ncov/need-extra-precautions/people-at-higher-risk.html Cover your mouth and nose with a cloth face cover when around others  You could spread COVID-19 to others even if you do not feel sick.  Everyone should wear a cloth face cover when they have to go out in public, for example to the grocery store or to pick up other necessities. ? Cloth face coverings should not be placed on young children under age 2, anyone who has trouble breathing, or is unconscious,  incapacitated or otherwise unable to remove the mask without assistance.  The cloth face cover is meant to protect other people in case you are infected.  Do NOT use a facemask meant for a healthcare worker.  Continue to keep about 6 feet between yourself and others. The cloth face cover is not a substitute for social distancing. Cover coughs and sneezes  If you are in a private setting and do not have on your cloth face covering, remember to always cover your mouth and nose with a tissue when you cough or sneeze or use the inside of your elbow.  Throw used tissues in the trash.  Immediately wash your hands with soap and water for at least 20 seconds. If soap and water are not readily available, clean your hands with a hand sanitizer that contains at least 60% alcohol. Clean and disinfect  Clean AND disinfect frequently touched surfaces daily. This includes tables, doorknobs, light switches, countertops, handles, desks, phones, keyboards, toilets, faucets, and sinks. www.cdc.gov/coronavirus/2019-ncov/prevent-getting-sick/disinfecting-your-home.html  If surfaces are dirty, clean them: Use detergent or soap and water prior to disinfection.  Then, use a household disinfectant. You can see a list of EPA-registered household disinfectants here. cdc.gov/coronavirus 10/14/2018 This information is not intended to replace advice given to you by your health care provider. Make sure you discuss any questions you have with your health care provider. Document Released: 09/23/2018 Document Revised: 10/22/2018 Document Reviewed: 09/23/2018 Elsevier Patient Education  2020 Elsevier Inc.  

## 2019-06-03 NOTE — Progress Notes (Signed)
These results were reviewed with the patient.

## 2019-06-04 NOTE — Progress Notes (Signed)
The patient was seen today in follow-up of a transfusion of 2 units of packed red blood cells yesterday and a dose of Granix.  She reports that she is feeling better today.  Her labs look significantly better.  A CBC returned today with a WBC of 5.1, hemoglobin 10.3, hematocrit 30.3, platelet count 135, and ANC of 3.4.  The patient was released home.  She did not need additional Granix today.  She will return as scheduled.  Sandi Mealy, MHS, PA-C Physician Assistant

## 2019-06-08 ENCOUNTER — Telehealth: Payer: Self-pay

## 2019-06-08 ENCOUNTER — Telehealth: Payer: Self-pay | Admitting: Medical Oncology

## 2019-06-08 NOTE — Telephone Encounter (Signed)
DCP-001 Call to patient regarding study. Patient was referred to study S1714 by Dr. Burr Medico and had declined. Patient eligible for DCP.  I spoke with patient about this DCP study and gave her a brief explanation as to what the study entails and that the [employer]CI was gathering information regarding patient's decisions to participate in studies. Informed patient that this would be a one time data collection and majority of data would be collected from her medical record. Inquired with patient if she had any questions and if I could meet with her, when she is in clinic tomorrow, for MD and treatment appointment.  Patient stated she was not interested and declined participation. Patient thanked for her time.  Maxwell Marion, RN, BSN, [facility] Clinical Research 06/08/2019 4:23 PM

## 2019-06-08 NOTE — Telephone Encounter (Signed)
Left message for patient regarding her appointment tomorrow. Have added her at 12:20 to see Dr. Burr Medico prior to her other appointments.   Also per Dr. Burr Medico she would like to possibly do weekly Taxol treatments. This would be at a significantly reduced dose.  I asked the patient to call me back to let me know how she feels about this.

## 2019-06-08 NOTE — Telephone Encounter (Signed)
Patient calls back and says she agrees with weekly Taxol. Dr. Burr Medico made aware.

## 2019-06-08 NOTE — Progress Notes (Signed)
[place]   Telephone:([phone]) [phone] Fax:(336) [phone]   Clinic Follow up Note   Patient Care Team: Mosie Lukes, MD as PCP - General (Family Medicine) Cherylann Banas, Cherly Anderson, MD (Inactive) (Obstetrics and Gynecology) Rockwell Germany, RN as Oncology Nurse Navigator Mauro Kaufmann, RN as Oncology Nurse Navigator Erroll Luna, MD as Consulting Physician (General Surgery) Truitt Merle, MD as Consulting Physician (Hematology) Gery Pray, MD as Consulting Physician (Radiation Oncology)  Date of Service:  06/09/2019  CHIEF COMPLAINT:  F/u of left breast cancer  SUMMARY OF ONCOLOGIC HISTORY: Oncology History Overview Note  Cancer Staging Malignant neoplasm of upper-inner quadrant of left breast in female, estrogen receptor negative ([place]) Staging form: Breast, AJCC 8th Edition - Clinical stage from 02/06/2019: Stage IB (cT1c, cN0, cM0, G2, ER-, PR-, HER2-) - Signed by Truitt Merle, MD on 02/17/2019 - Pathologic stage from 03/10/2019: Stage IIA (pT2, pN0, cM0, G3, ER-, PR-, HER2-) - Signed by Truitt Merle, MD on 03/12/2019    Malignant neoplasm of upper-inner quadrant of left breast in female, estrogen receptor negative ([place])  02/04/2019 Mammogram   Diagnostic mammogram 02/04/19  IMPRESSION: Highly suspicious 1.7 x1.5 x1.7cm at 10:00 position of UPPER INNER LEFT breast mass 10 cm from nipple corresponding to the screening study finding. Tissue sampling recommended.   No abnormal LEFT axillary lymph nodes.   02/06/2019 Cancer Staging   Staging form: Breast, AJCC 8th Edition - Clinical stage from 02/06/2019: Stage IB (cT1c, cN0, cM0, G2, ER-, PR-, HER2-) - Signed by Truitt Merle, MD on 02/17/2019   02/06/2019 Initial Biopsy   Diagnosis 02/06/19 Breast, left, needle core biopsy, 10 o'clock, 10cm/n, ribbon clip - INVASIVE MAMMARY CARCINOMA.   02/06/2019 Receptors her2   Results: IMMUNOHISTOCHEMICAL AND MORPHOMETRIC ANALYSIS PERFORMED MANUALLY The tumor cells are NEGATIVE for Her2  (1+). Estrogen Receptor: 0%, NEGATIVE Progesterone Receptor: 0%, NEGATIVE Proliferation Marker Ki67: 40%   02/17/2019 Initial Diagnosis   Malignant neoplasm of upper-inner quadrant of left breast in female, estrogen receptor negative ([place])   03/10/2019 Cancer Staging   Staging form: Breast, AJCC 8th Edition - Pathologic stage from 03/10/2019: Stage IIA (pT2, pN0, cM0, G3, ER-, PR-, HER2-) - Signed by Truitt Merle, MD on 03/12/2019   03/10/2019 Surgery   LEFT BREAST LUMPECTOMY WITH RADIOACTIVE SEED AND LEFT SENTINEL LYMPH NODE MAPPING and PAC placement  by Dr. Brantley Stage  03/10/19   03/10/2019 Pathology Results   DIAGNOSIS: 03/10/19  A. BREAST, LEFT, LUMPECTOMY:  - Invasive ductal carcinoma, grade 3, 2.5 cm  - Carcinoma is 0.3 cm from superior margin  - Medial resection edge is 0.1 cm from carcinoma (final medial margin is  represented by part 2 which is negative for carcinoma)  - Negative for lymphovascular or perineural invasion  - Biopsy site changes  - See oncology table   B. BREAST, LEFT MEDIAL MARGIN, EXCISION:  - Benign breast parenchyma, negative for carcinoma   C. LYMPH NODE, LEFT AXILLARY, SENTINEL BIOPSY:  - Lymph node, negative for carcinoma (0/1)    04/06/2019 Imaging   Bone scan IMPRESSION: Mild focus of uptake is seen involving the anterior portion of left upper rib, but there is no corresponding abnormality seen on the CT scan of the same day. Potentially this may represent degenerative change or secondary to overlying postsurgical change. Attention on follow-up is recommended.   No definite scintigraphic evidence of osseous metastases.   04/06/2019 Imaging   CT CAP IMPRESSION: 1. Surgical changes left breast with borderline enlarged left axillary lymph node.  No other definite evidence for metastatic disease in the chest, abdomen, or pelvis. 2. Tiny bilateral pulmonary nodules. Attention on follow-up recommended. 3. Scattered small hypoattenuating lesions in the  liver parenchyma. Some of these have attenuation higher than would be expected for simple fluid and may be cysts complicated by proteinaceous debris or hemorrhage. MRI without and with contrast recommended to further evaluate. 4. Small to moderate hiatal hernia. 5. Aortic Atherosclerosis (ICD10-I70.0).   04/08/2019 -  Chemotherapy   chemo AC q2weeks for 4 cycles starting 04/08/19-05/20/19 followed by Taxol weekly for 12 weeks starting 06/09/19.        CURRENT THERAPY:  chemo AC q2weeks for 4 cycles starting 04/08/19-05/20/19 followed by Taxol weekly for 12 weeks starting 06/09/19.   INTERVAL HISTORY:  Cindy [name] is here for a follow up and treatment. She presents to the clinic alone. She notes she feels better than last week. She is still fatigued after blood transfusion. She denies lightheadedness or dizziness anymore. She still has some nausea and feels her appetite has decreased the last few days. She feels her nausea is mostly at night ans is related to her hiatal hernia. She does take antiemetics which helps. She notes this last week she has not been very active outside but was able to do housework.  She notes she never was given clear answer on cost for her Taxol treatment. She is overall ready to proceed with start of Taxol.     REVIEW OF SYSTEMS:   Constitutional: Denies fevers, chills (+) Low appetite (+) Fatigue  Eyes: Denies blurriness of vision Ears, nose, mouth, throat, and face: Denies mucositis or sore throat Respiratory: Denies cough, dyspnea or wheezes Cardiovascular: Denies palpitation, chest discomfort or lower extremity swelling Gastrointestinal:  Denies heartburn or change in bowel habits (+) Nausea  Skin: Denies abnormal skin rashes Lymphatics: Denies new lymphadenopathy or easy bruising Neurological:Denies numbness, tingling or new weaknesses Behavioral/Psych: Mood is stable, no new changes  All other systems were reviewed with the patient and are  negative.  MEDICAL HISTORY:  Past Medical History:  Diagnosis Date  . Allergy   . Depression    ? if bipolar works the best  . Elevated fasting blood sugar    hx  . H/O measles   . Headache   . History of chicken pox   . Hx of migraines   . Microscopic hematuria    advised to see urology ? never went  . Preventative health care 06/20/2011    SURGICAL HISTORY: Past Surgical History:  Procedure Laterality Date  . BREAST LUMPECTOMY WITH RADIOACTIVE SEED AND SENTINEL LYMPH NODE BIOPSY Left 03/10/2019   Procedure: LEFT BREAST LUMPECTOMY WITH RADIOACTIVE SEED AND LEFT SENTINEL LYMPH NODE MAPPING;  Surgeon: Erroll Luna, MD;  Location: [facility];  Service: General;  Laterality: Left;  . endometrial polyps     2 surgeries by Dr Cathlean Cower  . EYE SURGERY  04/29/2009   Rt eye tighten up  . PORTACATH PLACEMENT N/A 03/10/2019   Procedure: INSERTION PORT-A-CATH WITH ULTRASOUND;  Surgeon: Erroll Luna, MD;  Location: [facility];  Service: General;  Laterality: N/A;  . steel plate in skull Left    from a crushed skull    I have reviewed the social history and family history with the patient and they are unchanged from previous note.  ALLERGIES:  is allergic to bromfed and venlafaxine.  MEDICATIONS:  Current Outpatient Medications  Medication Sig Dispense Refill  . FLUoxetine (PROZAC) 40 MG capsule Take 1  capsule (40 mg total) by mouth daily. 90 capsule 0  . lidocaine-prilocaine (EMLA) cream Apply to affected area once 30 g 3  . ondansetron (ZOFRAN) 8 MG tablet Take 1 tablet (8 mg total) by mouth 2 (two) times daily as needed. Start on the third day after chemotherapy. 30 tablet 2  . prochlorperazine (COMPAZINE) 10 MG tablet Take 1 tablet (10 mg total) by mouth every 6 (six) hours as needed (Nausea or vomiting). 30 tablet 2   No current facility-administered medications for this visit.   Facility-Administered Medications Ordered in Other Visits  Medication Dose Route Frequency Provider Last  Rate Last Admin  . heparin lock flush 100 unit/mL  500 Units Intracatheter Once PRN Truitt Merle, MD      . sodium chloride flush (NS) 0.9 % injection 10 mL  10 mL Intracatheter PRN Truitt Merle, MD        PHYSICAL EXAMINATION: ECOG PERFORMANCE STATUS: 2 - Symptomatic, <50% confined to bed  Vitals:   06/09/19 1253  BP: 120/71  Pulse: 98  Resp: 18  Temp: 98.3 F (36.8 C)  SpO2: 98%   Filed Weights   06/09/19 1253  Weight: 153 lb 12.8 oz (69.8 kg)    Due to COVID19 we will limit examination to appearance. Patient had no complaints.  GENERAL:alert, no distress and comfortable SKIN: skin color normal, no rashes or significant lesions EYES: normal, Conjunctiva are pink and non-injected, sclera clear  NEURO: alert & oriented x 3 with fluent speech  LABORATORY DATA:  I have reviewed the data as listed CBC Latest Ref Rng & Units 06/09/2019 06/03/2019 06/02/2019  WBC 4.0 - 10.5 K/uL 4.1 5.1 0.8(LL)  Hemoglobin 12.0 - 15.0 g/dL 11.4(L) 10.3(L) 6.9(LL)  Hematocrit 36.0 - 46.0 % 34.9(L) 30.9(L) 20.8(L)  Platelets 150 - 400 K/uL 324 135(L) 102(L)     CMP Latest Ref Rng & Units 06/09/2019 06/03/2019 06/02/2019  Glucose 70 - 99 mg/dL 113(H) 115(H) 141(H)  BUN 8 - 23 mg/dL 13 12 11   Creatinine 0.44 - 1.00 mg/dL 0.78 0.81 0.83  Sodium 135 - 145 mmol/L 140 140 138  Potassium 3.5 - 5.1 mmol/L 4.3 4.9 3.9  Chloride 98 - 111 mmol/L 105 105 100  CO2 22 - 32 mmol/L 26 28 28   Calcium 8.9 - 10.3 mg/dL 9.2 9.2 8.8(L)  Total Protein 6.5 - 8.1 g/dL 6.7 6.4(L) 6.2(L)  Total Bilirubin 0.3 - 1.2 mg/dL 0.3 0.3 0.2(L)  Alkaline Phos 38 - 126 U/L 62 51 45  AST 15 - 41 U/L 19 14(L) 12(L)  ALT 0 - 44 U/L 25 12 12       RADIOGRAPHIC STUDIES: I have personally reviewed the radiological images as listed and agreed with the findings in the report. No results found.   ASSESSMENT & PLAN:  Cindy [name] is a 68 y.o. female with   1.Malignant neoplasm of upper-inner quadrant of left breast,  StageIIA,p(T2N0M0), ER/PR/HER2-,triple negative,GradeIII -She was diagnosed in 02/2019. She underwent left breast lumpectomy with SLNB on 03/10/19. -Given her aggressive triple negative breast cancer, adjuvant chemowas recommended to reduce her risk of recurrence. I started her onadjuvant chemo with dose denseAdriamycin and Cytoxan q2weeks for 4 cycles on 04/08/19 and plan to be followed by Taxol weeklyfor 12 weeks. Due to her poor tolerance to [facility], I do not recommend Taxol 172m/m2 every 2 weeks.  -Her dose reduced C4 AC was still poorly tolerated with severe anemia which required blood transfusion, fatigue, nausea and dizziness. She has improved after blood  transfusion and still recovering.   -Labs reviewed, her blood counts are improving. Will monitor on Taxol. I reviewed side effects with her. Will watch for neuropathy, I recommend she use ice bags during infusion. She is agreeable. She is overall ready to proceed with start of Taxol today.  -F/u in 1 week   2. Genetic Testing  -Her mother had colon cancer and father had bladder cancer, her MGF had cancer and her maternal great grandmother had breast cancer.  -I will check with Genetics to see if she is eligible for testing. She is interested if insurance covers it.  3. anemia -Secondary to chemo. -I encouraged her to start multivitamin.  -Improved with blood transfusion on 12/23   4. Acid reflux  -Does cause her to cough.  -Continue Prilosec OTC.   5. Nausea and Vomiting, Loss of appetite, weight loss  -Her nausea worsened with C3 and C4 but has been mostly controlled with antiemetics.  -She has no appetite but has been forcing herself to eat. Her weight has trended down slightly. I encouraged her to continue Antiemetics and she can use Glucerna for supplements. Will monitor on Taxol.     PLAN: -I refilled zofran and compazine today  -Labs reviewed and adequate to start weekly Taxol today, will change pre-med Benadryl  to Claritin, since she will drive after chemo -Lab, flush and Taxol in 1, 2, 3, 4 weeks  -F/u in 1 week    No problem-specific Assessment & Plan notes found for this encounter.   No orders of the defined types were placed in this encounter.  All questions were answered. The patient knows to call the clinic with any problems, questions or concerns. No barriers to learning was detected. I spent 20 minutes counseling the patient face to face. The total time spent in the appointment was 25 minutes and more than 50% was on counseling and review of test results     Truitt Merle, MD 06/09/2019   I, Joslyn Devon, am acting as scribe for Truitt Merle, MD.   I have reviewed the above documentation for accuracy and completeness, and I agree with the above.

## 2019-06-09 ENCOUNTER — Inpatient Hospital Stay: Payer: PPO

## 2019-06-09 ENCOUNTER — Inpatient Hospital Stay (HOSPITAL_BASED_OUTPATIENT_CLINIC_OR_DEPARTMENT_OTHER): Payer: PPO | Admitting: Hematology

## 2019-06-09 ENCOUNTER — Other Ambulatory Visit: Payer: Self-pay

## 2019-06-09 ENCOUNTER — Encounter: Payer: Self-pay | Admitting: Hematology

## 2019-06-09 VITALS — BP 120/69 | HR 79 | Temp 98.2°F | Resp 16

## 2019-06-09 VITALS — BP 120/71 | HR 98 | Temp 98.3°F | Resp 18 | Ht 64.0 in | Wt 153.8 lb

## 2019-06-09 DIAGNOSIS — Z5111 Encounter for antineoplastic chemotherapy: Secondary | ICD-10-CM | POA: Diagnosis not present

## 2019-06-09 DIAGNOSIS — Z171 Estrogen receptor negative status [ER-]: Secondary | ICD-10-CM

## 2019-06-09 DIAGNOSIS — C50212 Malignant neoplasm of upper-inner quadrant of left female breast: Secondary | ICD-10-CM

## 2019-06-09 LAB — CBC WITH DIFFERENTIAL (CANCER CENTER ONLY)
Abs Immature Granulocytes: 0.03 10*3/uL (ref 0.00–0.07)
Basophils Absolute: 0 10*3/uL (ref 0.0–0.1)
Basophils Relative: 1 %
Eosinophils Absolute: 0 10*3/uL (ref 0.0–0.5)
Eosinophils Relative: 0 %
HCT: 34.9 % — ABNORMAL LOW (ref 36.0–46.0)
Hemoglobin: 11.4 g/dL — ABNORMAL LOW (ref 12.0–15.0)
Immature Granulocytes: 1 %
Lymphocytes Relative: 16 %
Lymphs Abs: 0.6 10*3/uL — ABNORMAL LOW (ref 0.7–4.0)
MCH: 31 pg (ref 26.0–34.0)
MCHC: 32.7 g/dL (ref 30.0–36.0)
MCV: 94.8 fL (ref 80.0–100.0)
Monocytes Absolute: 0.9 10*3/uL (ref 0.1–1.0)
Monocytes Relative: 21 %
Neutro Abs: 2.5 10*3/uL (ref 1.7–7.7)
Neutrophils Relative %: 61 %
Platelet Count: 324 10*3/uL (ref 150–400)
RBC: 3.68 MIL/uL — ABNORMAL LOW (ref 3.87–5.11)
RDW: 16.5 % — ABNORMAL HIGH (ref 11.5–15.5)
WBC Count: 4.1 10*3/uL (ref 4.0–10.5)
nRBC: 0 % (ref 0.0–0.2)

## 2019-06-09 LAB — CMP (CANCER CENTER ONLY)
ALT: 25 U/L (ref 0–44)
AST: 19 U/L (ref 15–41)
Albumin: 3.8 g/dL (ref 3.5–5.0)
Alkaline Phosphatase: 62 U/L (ref 38–126)
Anion gap: 9 (ref 5–15)
BUN: 13 mg/dL (ref 8–23)
CO2: 26 mmol/L (ref 22–32)
Calcium: 9.2 mg/dL (ref 8.9–10.3)
Chloride: 105 mmol/L (ref 98–111)
Creatinine: 0.78 mg/dL (ref 0.44–1.00)
GFR, Est AFR Am: >60 mL/min (ref >60)
GFR, Estimated: >60 mL/min (ref >60)
Glucose, Bld: 113 mg/dL — ABNORMAL HIGH (ref 70–99)
Potassium: 4.3 mmol/L (ref 3.5–5.1)
Sodium: 140 mmol/L (ref 135–145)
Total Bilirubin: 0.3 mg/dL (ref 0.3–1.2)
Total Protein: 6.7 g/dL (ref 6.5–8.1)

## 2019-06-09 MED ORDER — PROCHLORPERAZINE MALEATE 10 MG PO TABS: 10.0000 mg | ORAL_TABLET | Freq: Four times a day (QID) | ORAL | 2 refills | Status: DC | PRN

## 2019-06-09 MED ORDER — HEPARIN SOD (PORK) LOCK FLUSH 100 UNIT/ML IV SOLN
500.0000 [IU] | Freq: Once | INTRAVENOUS | Status: AC | PRN
  Administered 2019-06-09: 500 [IU]
  Filled 2019-06-09: qty 5

## 2019-06-09 MED ORDER — SODIUM CHLORIDE 0.9 % IV SOLN
20.0000 mg | Freq: Once | INTRAVENOUS | Status: AC
  Administered 2019-06-09: 20 mg via INTRAVENOUS
  Filled 2019-06-09: qty 20

## 2019-06-09 MED ORDER — LORATADINE 10 MG PO TABS
10.0000 mg | ORAL_TABLET | Freq: Once | ORAL | Status: AC
  Administered 2019-06-09: 10 mg via ORAL

## 2019-06-09 MED ORDER — FAMOTIDINE IN NACL 20-0.9 MG/50ML-% IV SOLN
INTRAVENOUS | Status: AC
  Filled 2019-06-09: qty 50

## 2019-06-09 MED ORDER — SODIUM CHLORIDE 0.9% FLUSH
10.0000 mL | INTRAVENOUS | Status: DC | PRN
  Administered 2019-06-09: 10 mL
  Filled 2019-06-09: qty 10

## 2019-06-09 MED ORDER — SODIUM CHLORIDE 0.9 % IV SOLN
80.0000 mg/m2 | Freq: Once | INTRAVENOUS | Status: AC
  Administered 2019-06-09: 144 mg via INTRAVENOUS
  Filled 2019-06-09: qty 24

## 2019-06-09 MED ORDER — FAMOTIDINE IN NACL 20-0.9 MG/50ML-% IV SOLN
20.0000 mg | Freq: Once | INTRAVENOUS | Status: AC
  Administered 2019-06-09: 20 mg via INTRAVENOUS

## 2019-06-09 MED ORDER — ONDANSETRON HCL 8 MG PO TABS: 8.0000 mg | ORAL_TABLET | Freq: Two times a day (BID) | ORAL | 2 refills | Status: DC | PRN

## 2019-06-09 MED ORDER — LORATADINE 10 MG PO TABS
ORAL_TABLET | ORAL | Status: AC
  Filled 2019-06-09: qty 1

## 2019-06-09 MED ORDER — SODIUM CHLORIDE 0.9 % IV SOLN
Freq: Once | INTRAVENOUS | Status: AC
  Filled 2019-06-09: qty 250

## 2019-06-09 NOTE — Patient Instructions (Signed)
[facility] Discharge Instructions for Patients Receiving Chemotherapy  Today you received the following chemotherapy agents: Taxol.  To help prevent nausea and vomiting after your treatment, we encourage you to take your nausea medication as directed.   If you develop nausea and vomiting that is not controlled by your nausea medication, call the clinic.   BELOW ARE SYMPTOMS THAT SHOULD BE REPORTED IMMEDIATELY:  *FEVER GREATER THAN 100.5 F  *CHILLS WITH OR WITHOUT FEVER  NAUSEA AND VOMITING THAT IS NOT CONTROLLED WITH YOUR NAUSEA MEDICATION  *UNUSUAL SHORTNESS OF BREATH  *UNUSUAL BRUISING OR BLEEDING  TENDERNESS IN MOUTH AND THROAT WITH OR WITHOUT PRESENCE OF ULCERS  *URINARY PROBLEMS  *BOWEL PROBLEMS  UNUSUAL RASH Items with * indicate a potential emergency and should be followed up as soon as possible.  Feel free to call the clinic should you have any questions or concerns. The clinic phone number is [phone].  Please show the CHEMO ALERT CARD at check-in to the Emergency Department and triage nurse.  Paclitaxel injection What is this medicine? PACLITAXEL (PAK li TAX el) is a chemotherapy drug. It targets fast dividing cells, like cancer cells, and causes these cells to die. This medicine is used to treat ovarian cancer, breast cancer, lung cancer, Kaposi's sarcoma, and other cancers. This medicine may be used for other purposes; ask your health care provider or pharmacist if you have questions. COMMON BRAND NAME(S): Onxol, Taxol What should I tell my health care provider before I take this medicine? They need to know if you have any of these conditions:  history of irregular heartbeat  liver disease  low blood counts, like low white cell, platelet, or red cell counts  lung or breathing disease, like asthma  tingling of the fingers or toes, or other nerve disorder  an unusual or allergic reaction to paclitaxel, alcohol, polyoxyethylated castor  oil, other chemotherapy, other medicines, foods, dyes, or preservatives  pregnant or trying to get pregnant  breast-feeding How should I use this medicine? This drug is given as an infusion into a vein. It is administered in a hospital or clinic by a specially trained health care professional. Talk to your pediatrician regarding the use of this medicine in children. Special care may be needed. Overdosage: If you think you have taken too much of this medicine contact a poison control center or emergency room at once. NOTE: This medicine is only for you. Do not share this medicine with others. What if I miss a dose? It is important not to miss your dose. Call your doctor or health care professional if you are unable to keep an appointment. What may interact with this medicine? Do not take this medicine with any of the following medications:  disulfiram  metronidazole This medicine may also interact with the following medications:  antiviral medicines for hepatitis, HIV or AIDS  certain antibiotics like erythromycin and clarithromycin  certain medicines for fungal infections like ketoconazole and itraconazole  certain medicines for seizures like carbamazepine, phenobarbital, phenytoin  gemfibrozil  nefazodone  rifampin  St. John's wort This list may not describe all possible interactions. Give your health care provider a list of all the medicines, herbs, non-prescription drugs, or dietary supplements you use. Also tell them if you smoke, drink alcohol, or use illegal drugs. Some items may interact with your medicine. What should I watch for while using this medicine? Your condition will be monitored carefully while you are receiving this medicine. You will need important blood work   done while you are taking this medicine. This medicine can cause serious allergic reactions. To reduce your risk you will need to take other medicine(s) before treatment with this medicine. If you  experience allergic reactions like skin rash, itching or hives, swelling of the face, lips, or tongue, tell your doctor or health care professional right away. In some cases, you may be given additional medicines to help with side effects. Follow all directions for their use. This drug may make you feel generally unwell. This is not uncommon, as chemotherapy can affect healthy cells as well as cancer cells. Report any side effects. Continue your course of treatment even though you feel ill unless your doctor tells you to stop. Call your doctor or health care professional for advice if you get a fever, chills or sore throat, or other symptoms of a cold or flu. Do not treat yourself. This drug decreases your body's ability to fight infections. Try to avoid being around people who are sick. This medicine may increase your risk to bruise or bleed. Call your doctor or health care professional if you notice any unusual bleeding. Be careful brushing and flossing your teeth or using a toothpick because you may get an infection or bleed more easily. If you have any dental work done, tell your dentist you are receiving this medicine. Avoid taking products that contain aspirin, acetaminophen, ibuprofen, naproxen, or ketoprofen unless instructed by your doctor. These medicines may hide a fever. Do not become pregnant while taking this medicine. Women should inform their doctor if they wish to become pregnant or think they might be pregnant. There is a potential for serious side effects to an unborn child. Talk to your health care professional or pharmacist for more information. Do not breast-feed an infant while taking this medicine. Men are advised not to father a child while receiving this medicine. This product may contain alcohol. Ask your pharmacist or healthcare provider if this medicine contains alcohol. Be sure to tell all healthcare providers you are taking this medicine. Certain medicines, like metronidazole  and disulfiram, can cause an unpleasant reaction when taken with alcohol. The reaction includes flushing, headache, nausea, vomiting, sweating, and increased thirst. The reaction can last from 30 minutes to several hours. What side effects may I notice from receiving this medicine? Side effects that you should report to your doctor or health care professional as soon as possible:  allergic reactions like skin rash, itching or hives, swelling of the face, lips, or tongue  breathing problems  changes in vision  fast, irregular heartbeat  high or low blood pressure  mouth sores  pain, tingling, numbness in the hands or feet  signs of decreased platelets or bleeding - bruising, pinpoint red spots on the skin, black, tarry stools, blood in the urine  signs of decreased red blood cells - unusually weak or tired, feeling faint or lightheaded, falls  signs of infection - fever or chills, cough, sore throat, pain or difficulty passing urine  signs and symptoms of liver injury like dark yellow or brown urine; general ill feeling or flu-like symptoms; light-colored stools; loss of appetite; nausea; right upper belly pain; unusually weak or tired; yellowing of the eyes or skin  swelling of the ankles, feet, hands  unusually slow heartbeat Side effects that usually do not require medical attention (report to your doctor or health care professional if they continue or are bothersome):  diarrhea  hair loss  loss of appetite  muscle or joint pain    nausea, vomiting  pain, redness, or irritation at site where injected  tiredness This list may not describe all possible side effects. Call your doctor for medical advice about side effects. You may report side effects to FDA at 1-800-FDA-1088. Where should I keep my medicine? This drug is given in a hospital or clinic and will not be stored at home. NOTE: This sheet is a summary. It may not cover all possible information. If you have  questions about this medicine, talk to your doctor, pharmacist, or health care provider.  2020 Elsevier/Gold Standard (2017-01-29 13:14:55)  

## 2019-06-10 ENCOUNTER — Telehealth: Payer: Self-pay | Admitting: Hematology

## 2019-06-10 NOTE — Telephone Encounter (Signed)
Added Md visit per 12/29 los.

## 2019-06-11 ENCOUNTER — Ambulatory Visit: Payer: PPO

## 2019-06-17 ENCOUNTER — Inpatient Hospital Stay: Payer: PPO

## 2019-06-17 ENCOUNTER — Encounter: Payer: Self-pay | Admitting: Nurse Practitioner

## 2019-06-17 ENCOUNTER — Inpatient Hospital Stay (HOSPITAL_BASED_OUTPATIENT_CLINIC_OR_DEPARTMENT_OTHER): Payer: PPO | Admitting: Nurse Practitioner

## 2019-06-17 ENCOUNTER — Other Ambulatory Visit: Payer: PPO

## 2019-06-17 ENCOUNTER — Ambulatory Visit: Payer: PPO

## 2019-06-17 ENCOUNTER — Other Ambulatory Visit: Payer: Self-pay

## 2019-06-17 ENCOUNTER — Encounter: Payer: Self-pay | Admitting: *Deleted

## 2019-06-17 ENCOUNTER — Inpatient Hospital Stay: Payer: PPO | Attending: Hematology

## 2019-06-17 VITALS — BP 129/71 | HR 96 | Temp 98.2°F | Resp 16

## 2019-06-17 DIAGNOSIS — Z8052 Family history of malignant neoplasm of bladder: Secondary | ICD-10-CM | POA: Insufficient documentation

## 2019-06-17 DIAGNOSIS — Z95828 Presence of other vascular implants and grafts: Secondary | ICD-10-CM

## 2019-06-17 DIAGNOSIS — Z800 Family history of malignant neoplasm of digestive organs: Secondary | ICD-10-CM | POA: Insufficient documentation

## 2019-06-17 DIAGNOSIS — Z171 Estrogen receptor negative status [ER-]: Secondary | ICD-10-CM | POA: Insufficient documentation

## 2019-06-17 DIAGNOSIS — Z7952 Long term (current) use of systemic steroids: Secondary | ICD-10-CM | POA: Insufficient documentation

## 2019-06-17 DIAGNOSIS — R630 Anorexia: Secondary | ICD-10-CM | POA: Insufficient documentation

## 2019-06-17 DIAGNOSIS — D701 Agranulocytosis secondary to cancer chemotherapy: Secondary | ICD-10-CM | POA: Diagnosis not present

## 2019-06-17 DIAGNOSIS — Z79899 Other long term (current) drug therapy: Secondary | ICD-10-CM | POA: Insufficient documentation

## 2019-06-17 DIAGNOSIS — R112 Nausea with vomiting, unspecified: Secondary | ICD-10-CM | POA: Diagnosis not present

## 2019-06-17 DIAGNOSIS — K219 Gastro-esophageal reflux disease without esophagitis: Secondary | ICD-10-CM | POA: Diagnosis not present

## 2019-06-17 DIAGNOSIS — F329 Major depressive disorder, single episode, unspecified: Secondary | ICD-10-CM | POA: Insufficient documentation

## 2019-06-17 DIAGNOSIS — R110 Nausea: Secondary | ICD-10-CM | POA: Diagnosis not present

## 2019-06-17 DIAGNOSIS — Z803 Family history of malignant neoplasm of breast: Secondary | ICD-10-CM | POA: Insufficient documentation

## 2019-06-17 DIAGNOSIS — D6481 Anemia due to antineoplastic chemotherapy: Secondary | ICD-10-CM | POA: Diagnosis not present

## 2019-06-17 DIAGNOSIS — Z5111 Encounter for antineoplastic chemotherapy: Secondary | ICD-10-CM | POA: Diagnosis not present

## 2019-06-17 DIAGNOSIS — C50212 Malignant neoplasm of upper-inner quadrant of left female breast: Secondary | ICD-10-CM

## 2019-06-17 DIAGNOSIS — K5900 Constipation, unspecified: Secondary | ICD-10-CM | POA: Insufficient documentation

## 2019-06-17 DIAGNOSIS — M79606 Pain in leg, unspecified: Secondary | ICD-10-CM | POA: Insufficient documentation

## 2019-06-17 DIAGNOSIS — T451X5A Adverse effect of antineoplastic and immunosuppressive drugs, initial encounter: Secondary | ICD-10-CM | POA: Diagnosis not present

## 2019-06-17 DIAGNOSIS — R634 Abnormal weight loss: Secondary | ICD-10-CM | POA: Insufficient documentation

## 2019-06-17 LAB — CMP (CANCER CENTER ONLY)
ALT: 35 U/L (ref 0–44)
AST: 24 U/L (ref 15–41)
Albumin: 3.9 g/dL (ref 3.5–5.0)
Alkaline Phosphatase: 58 U/L (ref 38–126)
Anion gap: 9 (ref 5–15)
BUN: 9 mg/dL (ref 8–23)
CO2: 26 mmol/L (ref 22–32)
Calcium: 8.7 mg/dL — ABNORMAL LOW (ref 8.9–10.3)
Chloride: 106 mmol/L (ref 98–111)
Creatinine: 0.75 mg/dL (ref 0.44–1.00)
GFR, Est AFR Am: >60 mL/min (ref >60)
GFR, Estimated: >60 mL/min (ref >60)
Glucose, Bld: 99 mg/dL (ref 70–99)
Potassium: 4.2 mmol/L (ref 3.5–5.1)
Sodium: 141 mmol/L (ref 135–145)
Total Bilirubin: 0.3 mg/dL (ref 0.3–1.2)
Total Protein: 6.3 g/dL — ABNORMAL LOW (ref 6.5–8.1)

## 2019-06-17 LAB — CBC WITH DIFFERENTIAL (CANCER CENTER ONLY)
Abs Immature Granulocytes: 0.01 10*3/uL (ref 0.00–0.07)
Basophils Absolute: 0 10*3/uL (ref 0.0–0.1)
Basophils Relative: 1 %
Eosinophils Absolute: 0 10*3/uL (ref 0.0–0.5)
Eosinophils Relative: 1 %
HCT: 31 % — ABNORMAL LOW (ref 36.0–46.0)
Hemoglobin: 10.5 g/dL — ABNORMAL LOW (ref 12.0–15.0)
Immature Granulocytes: 0 %
Lymphocytes Relative: 21 %
Lymphs Abs: 0.6 10*3/uL — ABNORMAL LOW (ref 0.7–4.0)
MCH: 31.9 pg (ref 26.0–34.0)
MCHC: 33.9 g/dL (ref 30.0–36.0)
MCV: 94.2 fL (ref 80.0–100.0)
Monocytes Absolute: 0.4 10*3/uL (ref 0.1–1.0)
Monocytes Relative: 14 %
Neutro Abs: 1.8 10*3/uL (ref 1.7–7.7)
Neutrophils Relative %: 63 %
Platelet Count: 191 10*3/uL (ref 150–400)
RBC: 3.29 MIL/uL — ABNORMAL LOW (ref 3.87–5.11)
RDW: 16 % — ABNORMAL HIGH (ref 11.5–15.5)
WBC Count: 2.9 10*3/uL — ABNORMAL LOW (ref 4.0–10.5)
nRBC: 0 % (ref 0.0–0.2)

## 2019-06-17 MED ORDER — HEPARIN SOD (PORK) LOCK FLUSH 100 UNIT/ML IV SOLN
500.0000 [IU] | Freq: Once | INTRAVENOUS | Status: AC | PRN
  Administered 2019-06-17: 16:00:00 500 [IU]
  Filled 2019-06-17: qty 5

## 2019-06-17 MED ORDER — SODIUM CHLORIDE 0.9 % IV SOLN
20.0000 mg | Freq: Once | INTRAVENOUS | Status: AC
  Administered 2019-06-17: 20 mg via INTRAVENOUS
  Filled 2019-06-17: qty 20

## 2019-06-17 MED ORDER — SODIUM CHLORIDE 0.9% FLUSH
10.0000 mL | INTRAVENOUS | Status: DC | PRN
  Administered 2019-06-17: 10 mL via INTRAVENOUS
  Filled 2019-06-17: qty 10

## 2019-06-17 MED ORDER — LORATADINE 10 MG PO TABS
ORAL_TABLET | ORAL | Status: AC
  Filled 2019-06-17: qty 1

## 2019-06-17 MED ORDER — SODIUM CHLORIDE 0.9% FLUSH
10.0000 mL | INTRAVENOUS | Status: DC | PRN
  Administered 2019-06-17: 16:00:00 10 mL
  Filled 2019-06-17: qty 10

## 2019-06-17 MED ORDER — FAMOTIDINE IN NACL 20-0.9 MG/50ML-% IV SOLN
INTRAVENOUS | Status: AC
  Filled 2019-06-17: qty 50

## 2019-06-17 MED ORDER — SODIUM CHLORIDE 0.9 % IV SOLN
80.0000 mg/m2 | Freq: Once | INTRAVENOUS | Status: AC
  Administered 2019-06-17: 144 mg via INTRAVENOUS
  Filled 2019-06-17: qty 24

## 2019-06-17 MED ORDER — SODIUM CHLORIDE 0.9 % IV SOLN
Freq: Once | INTRAVENOUS | Status: AC
  Filled 2019-06-17: qty 250

## 2019-06-17 MED ORDER — FAMOTIDINE IN NACL 20-0.9 MG/50ML-% IV SOLN
20.0000 mg | Freq: Once | INTRAVENOUS | Status: AC
  Administered 2019-06-17: 13:00:00 20 mg via INTRAVENOUS

## 2019-06-17 MED ORDER — HEPARIN SOD (PORK) LOCK FLUSH 100 UNIT/ML IV SOLN
500.0000 [IU] | Freq: Once | INTRAVENOUS | Status: DC
  Filled 2019-06-17: qty 5

## 2019-06-17 MED ORDER — LORATADINE 10 MG PO TABS
10.0000 mg | ORAL_TABLET | Freq: Every day | ORAL | Status: DC
  Administered 2019-06-17: 10 mg via ORAL

## 2019-06-17 NOTE — Progress Notes (Signed)
[name]   [phone] [phone]   Clinic Follow up Note   Patient Care Team: Mosie Lukes, MD as PCP - General (Family Medicine) Cherylann Banas, Cherly Anderson, MD (Inactive) (Obstetrics and Gynecology) Rockwell Germany, RN as Oncology Nurse Navigator Mauro Kaufmann, RN as Oncology Nurse Navigator Erroll Luna, MD as Consulting Physician (General Surgery) Truitt Merle, MD as Consulting Physician (Hematology) Gery Pray, MD as Consulting Physician (Radiation Oncology) 06/17/2019  CHIEF COMPLAINT: F/u left breast cancer   SUMMARY OF ONCOLOGIC HISTORY: Oncology History Overview Note  Cancer Staging Malignant neoplasm of upper-inner quadrant of left breast in female, estrogen receptor negative ([place]) Staging form: Breast, AJCC 8th Edition - Clinical stage from 02/06/2019: Stage IB (cT1c, cN0, cM0, G2, ER-, PR-, HER2-) - Signed by Truitt Merle, MD on 02/17/2019 - Pathologic stage from 03/10/2019: Stage IIA (pT2, pN0, cM0, G3, ER-, PR-, HER2-) - Signed by Truitt Merle, MD on 03/12/2019    Malignant neoplasm of upper-inner quadrant of left breast in female, estrogen receptor negative ([place])  02/04/2019 Mammogram   Diagnostic mammogram 02/04/19  IMPRESSION: Highly suspicious 1.7 x1.5 x1.7cm at 10:00 position of UPPER INNER LEFT breast mass 10 cm from nipple corresponding to the screening study finding. Tissue sampling recommended.   No abnormal LEFT axillary lymph nodes.   02/06/2019 Cancer Staging   Staging form: Breast, AJCC 8th Edition - Clinical stage from 02/06/2019: Stage IB (cT1c, cN0, cM0, G2, ER-, PR-, HER2-) - Signed by Truitt Merle, MD on 02/17/2019   02/06/2019 Initial Biopsy   Diagnosis 02/06/19 Breast, left, needle core biopsy, 10 o'clock, 10cm/n, ribbon clip - INVASIVE MAMMARY CARCINOMA.   02/06/2019 Receptors her2   Results: IMMUNOHISTOCHEMICAL AND MORPHOMETRIC ANALYSIS PERFORMED MANUALLY The tumor cells are NEGATIVE for Her2 (1+). Estrogen Receptor:  0%, NEGATIVE Progesterone Receptor: 0%, NEGATIVE Proliferation Marker Ki67: 40%   02/17/2019 Initial Diagnosis   Malignant neoplasm of upper-inner quadrant of left breast in female, estrogen receptor negative ([place])   03/10/2019 Cancer Staging   Staging form: Breast, AJCC 8th Edition - Pathologic stage from 03/10/2019: Stage IIA (pT2, pN0, cM0, G3, ER-, PR-, HER2-) - Signed by Truitt Merle, MD on 03/12/2019   03/10/2019 Surgery   LEFT BREAST LUMPECTOMY WITH RADIOACTIVE SEED AND LEFT SENTINEL LYMPH NODE MAPPING and PAC placement  by Dr. Brantley Stage  03/10/19   03/10/2019 Pathology Results   DIAGNOSIS: 03/10/19  A. BREAST, LEFT, LUMPECTOMY:  - Invasive ductal carcinoma, grade 3, 2.5 cm  - Carcinoma is 0.3 cm from superior margin  - Medial resection edge is 0.1 cm from carcinoma (final medial margin is  represented by part 2 which is negative for carcinoma)  - Negative for lymphovascular or perineural invasion  - Biopsy site changes  - See oncology table   B. BREAST, LEFT MEDIAL MARGIN, EXCISION:  - Benign breast parenchyma, negative for carcinoma   C. LYMPH NODE, LEFT AXILLARY, SENTINEL BIOPSY:  - Lymph node, negative for carcinoma (0/1)    04/06/2019 Imaging   Bone scan IMPRESSION: Mild focus of uptake is seen involving the anterior portion of left upper rib, but there is no corresponding abnormality seen on the CT scan of the same day. Potentially this may represent degenerative change or secondary to overlying postsurgical change. Attention on follow-up is recommended.   No definite scintigraphic evidence of osseous metastases.   04/06/2019 Imaging   CT CAP IMPRESSION: 1. Surgical changes left breast with borderline enlarged left axillary lymph node. No other definite evidence for metastatic  disease in the chest, abdomen, or pelvis. 2. Tiny bilateral pulmonary nodules. Attention on follow-up recommended. 3. Scattered small hypoattenuating lesions in the liver parenchyma. Some of  these have attenuation higher than would be expected for simple fluid and may be cysts complicated by proteinaceous debris or hemorrhage. MRI without and with contrast recommended to further evaluate. 4. Small to moderate hiatal hernia. 5. Aortic Atherosclerosis (ICD10-I70.0).   04/08/2019 -  Chemotherapy   chemo AC q2weeks for 4 cycles starting 04/08/19-05/20/19 followed by Taxol weekly for 12 weeks starting 06/09/19.       CURRENT THERAPY: adjuvant chemo AC q2weeks for 4 cycles starting 04/08/19-05/20/19 followed by Taxol weekly for 12 weeks starting 06/09/19.   INTERVAL HISTORY: [name] was seen in the infusion room, 2nd taxol infusing. She did well after first cycle. Energy and appetite remained adequate. She had "low key nausea" without vomiting for a few days, managed with anti-emetics. She had no BM for 3 days after taxol, then used laxative and had diarrhea x1 that resolved. She had moderate leg aches on days 3 and 4 after treatment. She remained able to walk, did not require pain meds. Denies fever, chills, cough, chest pain, dyspnea, leg swelling, neuropathy, or rash.    MEDICAL HISTORY:  Past Medical History:  Diagnosis Date  . Allergy   . Depression    ? if bipolar works the best  . Elevated fasting blood sugar    hx  . H/O measles   . Headache   . History of chicken pox   . Hx of migraines   . Microscopic hematuria    advised to see urology ? never went  . Preventative health care 06/20/2011    SURGICAL HISTORY: Past Surgical History:  Procedure Laterality Date  . BREAST LUMPECTOMY WITH RADIOACTIVE SEED AND SENTINEL LYMPH NODE BIOPSY Left 03/10/2019   Procedure: LEFT BREAST LUMPECTOMY WITH RADIOACTIVE SEED AND LEFT SENTINEL LYMPH NODE MAPPING;  Surgeon: Erroll Luna, MD;  Location: [place];  Service: General;  Laterality: Left;  . endometrial polyps     2 surgeries by Dr Cathlean Cower  . EYE SURGERY  04/29/2009   Rt eye tighten up  . PORTACATH PLACEMENT N/A  03/10/2019   Procedure: INSERTION PORT-A-CATH WITH ULTRASOUND;  Surgeon: Erroll Luna, MD;  Location: [place];  Service: General;  Laterality: N/A;  . steel plate in skull Left    from a crushed skull    I have reviewed the social history and family history with the patient and they are unchanged from previous note.  ALLERGIES:  is allergic to bromfed and venlafaxine.  MEDICATIONS:  Current Outpatient Medications  Medication Sig Dispense Refill  . FLUoxetine (PROZAC) 40 MG capsule Take 1 capsule (40 mg total) by mouth daily. 90 capsule 0  . lidocaine-prilocaine (EMLA) cream Apply to affected area once 30 g 3  . ondansetron (ZOFRAN) 8 MG tablet Take 1 tablet (8 mg total) by mouth 2 (two) times daily as needed. Start on the third day after chemotherapy. 30 tablet 2  . prochlorperazine (COMPAZINE) 10 MG tablet Take 1 tablet (10 mg total) by mouth every 6 (six) hours as needed (Nausea or vomiting). 30 tablet 2   No current facility-administered medications for this visit.   Facility-Administered Medications Ordered in Other Visits  Medication Dose Route Frequency Provider Last Rate Last Admin  . heparin lock flush 100 unit/mL  500 Units Intracatheter Once PRN Truitt Merle, MD      . loratadine (CLARITIN) tablet 10  mg  10 mg Oral Daily Truitt Merle, MD   10 mg at 06/17/19 1314  . PACLitaxel (TAXOL) 144 mg in sodium chloride 0.9 % 250 mL chemo infusion (> 33m/m2)  80 mg/m2 (Treatment Plan Recorded) Intravenous Once FTruitt Merle MD 274 mL/hr at 06/17/19 1426 144 mg at 06/17/19 1426  . sodium chloride flush (NS) 0.9 % injection 10 mL  10 mL Intracatheter PRN FTruitt Merle MD        PHYSICAL EXAMINATION: ECOG PERFORMANCE STATUS: 1 - Symptomatic but completely ambulatory BP 129/71, P 96, T 98.2, RR 16  GENERAL:alert, no distress and comfortable SKIN: no rash  EYES: sclera clear LUNGS: clear with normal breathing effort HEART: regular rate & rhythm, no lower extremity edema ABDOMEN:abdomen soft,  non-tender and normal bowel sounds NEURO: alert & oriented x 3 with fluent speech PAC without erythema  BREAST exam deferred   LABORATORY DATA:  I have reviewed the data as listed CBC Latest Ref Rng & Units 06/17/2019 06/09/2019 06/03/2019  WBC 4.0 - 10.5 K/uL 2.9(L) 4.1 5.1  Hemoglobin 12.0 - 15.0 g/dL 10.5(L) 11.4(L) 10.3(L)  Hematocrit 36.0 - 46.0 % 31.0(L) 34.9(L) 30.9(L)  Platelets 150 - 400 K/uL 191 324 135(L)     CMP Latest Ref Rng & Units 06/17/2019 06/09/2019 06/03/2019  Glucose 70 - 99 mg/dL 99 113(H) 115(H)  BUN 8 - 23 mg/dL 9 13 12   Creatinine 0.44 - 1.00 mg/dL 0.75 0.78 0.81  Sodium 135 - 145 mmol/L 141 140 140  Potassium 3.5 - 5.1 mmol/L 4.2 4.3 4.9  Chloride 98 - 111 mmol/L 106 105 105  CO2 22 - 32 mmol/L 26 26 28   Calcium 8.9 - 10.3 mg/dL 8.7(L) 9.2 9.2  Total Protein 6.5 - 8.1 g/dL 6.3(L) 6.7 6.4(L)  Total Bilirubin 0.3 - 1.2 mg/dL 0.3 0.3 0.3  Alkaline Phos 38 - 126 U/L 58 62 51  AST 15 - 41 U/L 24 19 14(L)  ALT 0 - 44 U/L 35 25 12      RADIOGRAPHIC STUDIES: I have personally reviewed the radiological images as listed and agreed with the findings in the report. No results found.   ASSESSMENT & PLAN: Cindy GEIDELis a 69y.o. female with   1.Malignant neoplasm of upper-inner quadrant of left breast, StageIIA,p(T2N0M0), ER/PR/HER2-,triple negative,GradeIII -She was diagnosed in 02/2019. She underwent left breast lumpectomy with SLNB on 03/10/19. -staging work up showed no definite evidence of metastasis  -due to her aggressive triple negative breast cancer, adjuvant chemo was recommended to reduce recurrence risk. She completed ddAC q2 weeks x4 (dose reduced C4) from 04/08/19 to 05/20/19. She did not tolerate well with decreased appetite, n/v, neutropenia and anemia requiring blood transfusion  -She began weekly taxol on 12/29, tolerated much better than AC, mild nausea, constipation and body aches, she recovered well   2. Genetic Testing  -Her  mother had colon cancer and father had bladder cancer, her MGF had cancer and her maternal great grandmother had breast cancer.  -I will check with Genetics to see if she is eligible for testing. She is interested if insurance covers it.  3. anemia -Secondary to chemo, required RBC transfusion on 12/23 after cycle 4 AC. -improved   4. Acid reflux  -Does cause her to cough.  -previously on prilosec but lost its efficacy, currently mild without treatment. -monitoring   5. Nausea and Vomiting, Loss of appetite, weight loss -Hernausea worsened with cycles 3 and 4 of AC, but has been mostly controlled with  antiemetics.  -appetite improved after she completed AC, not weighed today but states she is eating much better since starting taxol -monitor    Disposition:  [name] appears stable. She completed cycle 1 adjuvant taxol, tolerated mostly well overall with mild nausea, constipation, and moderate lower body aches. We reviewed symptom management for nausea/constipation. I recommend to start stool softener daily and take 1/2 - 1 dose miralax daily PRN. Body pain and other symptoms resolved, she recovered well. CBC and CMP are adequate for treatment. She will complete cycle 2 weekly taxol today. She will return next week for cycle 3, f/u in 2 weeks with cycle 4.    No problem-specific Assessment & Plan notes found for this encounter.   No orders of the defined types were placed in this encounter.  All questions were answered. The patient knows to call the clinic with any problems, questions or concerns. No barriers to learning was detected.     Alla Feeling, NP 06/17/19

## 2019-06-17 NOTE — Patient Instructions (Signed)
[facility] Discharge Instructions for Patients Receiving Chemotherapy  Today you received the following chemotherapy agents: Taxol.  To help prevent nausea and vomiting after your treatment, we encourage you to take your nausea medication as directed.   If you develop nausea and vomiting that is not controlled by your nausea medication, call the clinic.   BELOW ARE SYMPTOMS THAT SHOULD BE REPORTED IMMEDIATELY:  *FEVER GREATER THAN 100.5 F  *CHILLS WITH OR WITHOUT FEVER  NAUSEA AND VOMITING THAT IS NOT CONTROLLED WITH YOUR NAUSEA MEDICATION  *UNUSUAL SHORTNESS OF BREATH  *UNUSUAL BRUISING OR BLEEDING  TENDERNESS IN MOUTH AND THROAT WITH OR WITHOUT PRESENCE OF ULCERS  *URINARY PROBLEMS  *BOWEL PROBLEMS  UNUSUAL RASH Items with * indicate a potential emergency and should be followed up as soon as possible.  Feel free to call the clinic should you have any questions or concerns. The clinic phone number is [phone].  Please show the CHEMO ALERT CARD at check-in to the Emergency Department and triage nurse.  Paclitaxel injection What is this medicine? PACLITAXEL (PAK li TAX el) is a chemotherapy drug. It targets fast dividing cells, like cancer cells, and causes these cells to die. This medicine is used to treat ovarian cancer, breast cancer, lung cancer, Kaposi's sarcoma, and other cancers. This medicine may be used for other purposes; ask your health care provider or pharmacist if you have questions. COMMON BRAND NAME(S): Onxol, Taxol What should I tell my health care provider before I take this medicine? They need to know if you have any of these conditions:  history of irregular heartbeat  liver disease  low blood counts, like low white cell, platelet, or red cell counts  lung or breathing disease, like asthma  tingling of the fingers or toes, or other nerve disorder  an unusual or allergic reaction to paclitaxel, alcohol, polyoxyethylated castor  oil, other chemotherapy, other medicines, foods, dyes, or preservatives  pregnant or trying to get pregnant  breast-feeding How should I use this medicine? This drug is given as an infusion into a vein. It is administered in a hospital or clinic by a specially trained health care professional. Talk to your pediatrician regarding the use of this medicine in children. Special care may be needed. Overdosage: If you think you have taken too much of this medicine contact a poison control center or emergency room at once. NOTE: This medicine is only for you. Do not share this medicine with others. What if I miss a dose? It is important not to miss your dose. Call your doctor or health care professional if you are unable to keep an appointment. What may interact with this medicine? Do not take this medicine with any of the following medications:  disulfiram  metronidazole This medicine may also interact with the following medications:  antiviral medicines for hepatitis, HIV or AIDS  certain antibiotics like erythromycin and clarithromycin  certain medicines for fungal infections like ketoconazole and itraconazole  certain medicines for seizures like carbamazepine, phenobarbital, phenytoin  gemfibrozil  nefazodone  rifampin  St. John's wort This list may not describe all possible interactions. Give your health care provider a list of all the medicines, herbs, non-prescription drugs, or dietary supplements you use. Also tell them if you smoke, drink alcohol, or use illegal drugs. Some items may interact with your medicine. What should I watch for while using this medicine? Your condition will be monitored carefully while you are receiving this medicine. You will need important blood work   done while you are taking this medicine. This medicine can cause serious allergic reactions. To reduce your risk you will need to take other medicine(s) before treatment with this medicine. If you  experience allergic reactions like skin rash, itching or hives, swelling of the face, lips, or tongue, tell your doctor or health care professional right away. In some cases, you may be given additional medicines to help with side effects. Follow all directions for their use. This drug may make you feel generally unwell. This is not uncommon, as chemotherapy can affect healthy cells as well as cancer cells. Report any side effects. Continue your course of treatment even though you feel ill unless your doctor tells you to stop. Call your doctor or health care professional for advice if you get a fever, chills or sore throat, or other symptoms of a cold or flu. Do not treat yourself. This drug decreases your body's ability to fight infections. Try to avoid being around people who are sick. This medicine may increase your risk to bruise or bleed. Call your doctor or health care professional if you notice any unusual bleeding. Be careful brushing and flossing your teeth or using a toothpick because you may get an infection or bleed more easily. If you have any dental work done, tell your dentist you are receiving this medicine. Avoid taking products that contain aspirin, acetaminophen, ibuprofen, naproxen, or ketoprofen unless instructed by your doctor. These medicines may hide a fever. Do not become pregnant while taking this medicine. Women should inform their doctor if they wish to become pregnant or think they might be pregnant. There is a potential for serious side effects to an unborn child. Talk to your health care professional or pharmacist for more information. Do not breast-feed an infant while taking this medicine. Men are advised not to father a child while receiving this medicine. This product may contain alcohol. Ask your pharmacist or healthcare provider if this medicine contains alcohol. Be sure to tell all healthcare providers you are taking this medicine. Certain medicines, like metronidazole  and disulfiram, can cause an unpleasant reaction when taken with alcohol. The reaction includes flushing, headache, nausea, vomiting, sweating, and increased thirst. The reaction can last from 30 minutes to several hours. What side effects may I notice from receiving this medicine? Side effects that you should report to your doctor or health care professional as soon as possible:  allergic reactions like skin rash, itching or hives, swelling of the face, lips, or tongue  breathing problems  changes in vision  fast, irregular heartbeat  high or low blood pressure  mouth sores  pain, tingling, numbness in the hands or feet  signs of decreased platelets or bleeding - bruising, pinpoint red spots on the skin, black, tarry stools, blood in the urine  signs of decreased red blood cells - unusually weak or tired, feeling faint or lightheaded, falls  signs of infection - fever or chills, cough, sore throat, pain or difficulty passing urine  signs and symptoms of liver injury like dark yellow or brown urine; general ill feeling or flu-like symptoms; light-colored stools; loss of appetite; nausea; right upper belly pain; unusually weak or tired; yellowing of the eyes or skin  swelling of the ankles, feet, hands  unusually slow heartbeat Side effects that usually do not require medical attention (report to your doctor or health care professional if they continue or are bothersome):  diarrhea  hair loss  loss of appetite  muscle or joint pain    nausea, vomiting  pain, redness, or irritation at site where injected  tiredness This list may not describe all possible side effects. Call your doctor for medical advice about side effects. You may report side effects to FDA at 1-800-FDA-1088. Where should I keep my medicine? This drug is given in a hospital or clinic and will not be stored at home. NOTE: This sheet is a summary. It may not cover all possible information. If you have  questions about this medicine, talk to your doctor, pharmacist, or health care provider.  2020 Elsevier/Gold Standard (2017-01-29 13:14:55)  

## 2019-06-24 ENCOUNTER — Ambulatory Visit: Payer: PPO

## 2019-06-24 ENCOUNTER — Other Ambulatory Visit: Payer: PPO

## 2019-06-24 ENCOUNTER — Inpatient Hospital Stay: Payer: PPO

## 2019-06-24 ENCOUNTER — Telehealth: Payer: Self-pay | Admitting: *Deleted

## 2019-06-24 ENCOUNTER — Other Ambulatory Visit: Payer: Self-pay | Admitting: Hematology

## 2019-06-24 ENCOUNTER — Telehealth: Payer: Self-pay

## 2019-06-24 ENCOUNTER — Other Ambulatory Visit: Payer: Self-pay

## 2019-06-24 DIAGNOSIS — C50212 Malignant neoplasm of upper-inner quadrant of left female breast: Secondary | ICD-10-CM

## 2019-06-24 DIAGNOSIS — Z5111 Encounter for antineoplastic chemotherapy: Secondary | ICD-10-CM | POA: Diagnosis not present

## 2019-06-24 DIAGNOSIS — Z95828 Presence of other vascular implants and grafts: Secondary | ICD-10-CM

## 2019-06-24 LAB — CBC WITH DIFFERENTIAL (CANCER CENTER ONLY)
Abs Immature Granulocytes: 0.01 10*3/uL (ref 0.00–0.07)
Basophils Absolute: 0 10*3/uL (ref 0.0–0.1)
Basophils Relative: 1 %
Eosinophils Absolute: 0.1 10*3/uL (ref 0.0–0.5)
Eosinophils Relative: 7 %
HCT: 29.7 % — ABNORMAL LOW (ref 36.0–46.0)
Hemoglobin: 10.2 g/dL — ABNORMAL LOW (ref 12.0–15.0)
Immature Granulocytes: 1 %
Lymphocytes Relative: 36 %
Lymphs Abs: 0.7 10*3/uL (ref 0.7–4.0)
MCH: 32.5 pg (ref 26.0–34.0)
MCHC: 34.3 g/dL (ref 30.0–36.0)
MCV: 94.6 fL (ref 80.0–100.0)
Monocytes Absolute: 0.3 10*3/uL (ref 0.1–1.0)
Monocytes Relative: 13 %
Neutro Abs: 0.8 10*3/uL — ABNORMAL LOW (ref 1.7–7.7)
Neutrophils Relative %: 42 %
Platelet Count: 192 10*3/uL (ref 150–400)
RBC: 3.14 MIL/uL — ABNORMAL LOW (ref 3.87–5.11)
RDW: 16.1 % — ABNORMAL HIGH (ref 11.5–15.5)
WBC Count: 1.9 10*3/uL — ABNORMAL LOW (ref 4.0–10.5)
nRBC: 0 % (ref 0.0–0.2)

## 2019-06-24 LAB — CMP (CANCER CENTER ONLY)
ALT: 42 U/L (ref 0–44)
AST: 24 U/L (ref 15–41)
Albumin: 3.7 g/dL (ref 3.5–5.0)
Alkaline Phosphatase: 52 U/L (ref 38–126)
Anion gap: 9 (ref 5–15)
BUN: 12 mg/dL (ref 8–23)
CO2: 24 mmol/L (ref 22–32)
Calcium: 8.7 mg/dL — ABNORMAL LOW (ref 8.9–10.3)
Chloride: 105 mmol/L (ref 98–111)
Creatinine: 0.78 mg/dL (ref 0.44–1.00)
GFR, Est AFR Am: >60 mL/min (ref >60)
GFR, Estimated: >60 mL/min (ref >60)
Glucose, Bld: 129 mg/dL — ABNORMAL HIGH (ref 70–99)
Potassium: 4.1 mmol/L (ref 3.5–5.1)
Sodium: 138 mmol/L (ref 135–145)
Total Bilirubin: 0.4 mg/dL (ref 0.3–1.2)
Total Protein: 6.1 g/dL — ABNORMAL LOW (ref 6.5–8.1)

## 2019-06-24 MED ORDER — SODIUM CHLORIDE 0.9% FLUSH
10.0000 mL | Freq: Once | INTRAVENOUS | Status: AC
  Administered 2019-06-24: 10 mL via INTRAVENOUS
  Filled 2019-06-24: qty 10

## 2019-06-24 MED ORDER — HEPARIN SOD (PORK) LOCK FLUSH 100 UNIT/ML IV SOLN
500.0000 [IU] | Freq: Once | INTRAVENOUS | Status: AC
  Administered 2019-06-24: 500 [IU] via INTRAVENOUS
  Filled 2019-06-24: qty 5

## 2019-06-24 NOTE — Telephone Encounter (Signed)
Ms Cindy [name] called she stated she did not want the granix injection as she did not get chemo today.

## 2019-06-24 NOTE — Telephone Encounter (Signed)
Telephone call to patient to inquire if she would like to receive the granix injection as recommended by Dr. Burr Medico. Patient's voicemail picked up and message was left for a return call.

## 2019-06-30 NOTE — Progress Notes (Signed)
[place]   [phone] [phone]   Clinic Follow up Note   Patient Care Team: Mosie Lukes, MD as PCP - General (Family Medicine) Cherylann Banas, Cherly Anderson, MD (Inactive) (Obstetrics and Gynecology) Rockwell Germany, RN as Oncology Nurse Navigator Mauro Kaufmann, RN as Oncology Nurse Navigator Erroll Luna, MD as Consulting Physician (General Surgery) Truitt Merle, MD as Consulting Physician (Hematology) Gery Pray, MD as Consulting Physician (Radiation Oncology) 07/01/2019  CHIEF COMPLAINT: F/u left breast cancer   SUMMARY OF ONCOLOGIC HISTORY: Oncology History Overview Note  Cancer Staging Malignant neoplasm of upper-inner quadrant of left breast in female, estrogen receptor negative ([place]) Staging form: Breast, AJCC 8th Edition - Clinical stage from 02/06/2019: Stage IB (cT1c, cN0, cM0, G2, ER-, PR-, HER2-) - Signed by Truitt Merle, MD on 02/17/2019 - Pathologic stage from 03/10/2019: Stage IIA (pT2, pN0, cM0, G3, ER-, PR-, HER2-) - Signed by Truitt Merle, MD on 03/12/2019    Malignant neoplasm of upper-inner quadrant of left breast in female, estrogen receptor negative ([place])  02/04/2019 Mammogram   Diagnostic mammogram 02/04/19  IMPRESSION: Highly suspicious 1.7 x1.5 x1.7cm at 10:00 position of UPPER INNER LEFT breast mass 10 cm from nipple corresponding to the screening study finding. Tissue sampling recommended.   No abnormal LEFT axillary lymph nodes.   02/06/2019 Cancer Staging   Staging form: Breast, AJCC 8th Edition - Clinical stage from 02/06/2019: Stage IB (cT1c, cN0, cM0, G2, ER-, PR-, HER2-) - Signed by Truitt Merle, MD on 02/17/2019   02/06/2019 Initial Biopsy   Diagnosis 02/06/19 Breast, left, needle core biopsy, 10 o'clock, 10cm/n, ribbon clip - INVASIVE MAMMARY CARCINOMA.   02/06/2019 Receptors her2   Results: IMMUNOHISTOCHEMICAL AND MORPHOMETRIC ANALYSIS PERFORMED MANUALLY The tumor cells are NEGATIVE for Her2 (1+). Estrogen Receptor:  0%, NEGATIVE Progesterone Receptor: 0%, NEGATIVE Proliferation Marker Ki67: 40%   02/17/2019 Initial Diagnosis   Malignant neoplasm of upper-inner quadrant of left breast in female, estrogen receptor negative ([place])   03/10/2019 Cancer Staging   Staging form: Breast, AJCC 8th Edition - Pathologic stage from 03/10/2019: Stage IIA (pT2, pN0, cM0, G3, ER-, PR-, HER2-) - Signed by Truitt Merle, MD on 03/12/2019   03/10/2019 Surgery   LEFT BREAST LUMPECTOMY WITH RADIOACTIVE SEED AND LEFT SENTINEL LYMPH NODE MAPPING and PAC placement  by Dr. Brantley Stage  03/10/19   03/10/2019 Pathology Results   DIAGNOSIS: 03/10/19  A. BREAST, LEFT, LUMPECTOMY:  - Invasive ductal carcinoma, grade 3, 2.5 cm  - Carcinoma is 0.3 cm from superior margin  - Medial resection edge is 0.1 cm from carcinoma (final medial margin is  represented by part 2 which is negative for carcinoma)  - Negative for lymphovascular or perineural invasion  - Biopsy site changes  - See oncology table   B. BREAST, LEFT MEDIAL MARGIN, EXCISION:  - Benign breast parenchyma, negative for carcinoma   C. LYMPH NODE, LEFT AXILLARY, SENTINEL BIOPSY:  - Lymph node, negative for carcinoma (0/1)    04/06/2019 Imaging   Bone scan IMPRESSION: Mild focus of uptake is seen involving the anterior portion of left upper rib, but there is no corresponding abnormality seen on the CT scan of the same day. Potentially this may represent degenerative change or secondary to overlying postsurgical change. Attention on follow-up is recommended.   No definite scintigraphic evidence of osseous metastases.   04/06/2019 Imaging   CT CAP IMPRESSION: 1. Surgical changes left breast with borderline enlarged left axillary lymph node. No other definite evidence for metastatic  disease in the chest, abdomen, or pelvis. 2. Tiny bilateral pulmonary nodules. Attention on follow-up recommended. 3. Scattered small hypoattenuating lesions in the liver parenchyma. Some of  these have attenuation higher than would be expected for simple fluid and may be cysts complicated by proteinaceous debris or hemorrhage. MRI without and with contrast recommended to further evaluate. 4. Small to moderate hiatal hernia. 5. Aortic Atherosclerosis (ICD10-I70.0).   04/08/2019 -  Chemotherapy   chemo AC q2weeks for 4 cycles starting 04/08/19-05/20/19 followed by Taxol weekly for 12 weeks starting 06/09/19.       CURRENT THERAPY:  adjuvant chemo AC q2weeks for 4 cycles starting 04/08/19-12/9/20followed by Taxol weekly for 12 weeks starting 06/09/19.  INTERVAL HISTORY: Ms. Rossano returns for f/u and treatment as scheduled. She did not get treatment 1/13 due to neutropenia and did not want granix. She feels well today. Appetite fluctuates. She drinks 1-3 bottles of water per day. She is tired but functional, felt well enough to walk outside yesterday. Had 1 episode of diarrhea on 1/16 one hour after drinking Boost, then resolved. Denies n/v, fever, chills, cough chest pain, dyspnea, swelling, mucositis, or neuropathy.    MEDICAL HISTORY:  Past Medical History:  Diagnosis Date  . Allergy   . Depression    ? if bipolar works the best  . Elevated fasting blood sugar    hx  . H/O measles   . Headache   . History of chicken pox   . Hx of migraines   . Microscopic hematuria    advised to see urology ? never went  . Preventative health care 06/20/2011    SURGICAL HISTORY: Past Surgical History:  Procedure Laterality Date  . BREAST LUMPECTOMY WITH RADIOACTIVE SEED AND SENTINEL LYMPH NODE BIOPSY Left 03/10/2019   Procedure: LEFT BREAST LUMPECTOMY WITH RADIOACTIVE SEED AND LEFT SENTINEL LYMPH NODE MAPPING;  Surgeon: Erroll Luna, MD;  Location: [facility];  Service: General;  Laterality: Left;  . endometrial polyps     2 surgeries by Dr Cathlean Cower  . EYE SURGERY  04/29/2009   Rt eye tighten up  . PORTACATH PLACEMENT N/A 03/10/2019   Procedure: INSERTION PORT-A-CATH WITH  ULTRASOUND;  Surgeon: Erroll Luna, MD;  Location: [facility];  Service: General;  Laterality: N/A;  . steel plate in skull Left    from a crushed skull    I have reviewed the social history and family history with the patient and they are unchanged from previous note.  ALLERGIES:  is allergic to bromfed and venlafaxine.  MEDICATIONS:  Current Outpatient Medications  Medication Sig Dispense Refill  . FLUoxetine (PROZAC) 40 MG capsule Take 1 capsule (40 mg total) by mouth daily. 90 capsule 0  . lidocaine-prilocaine (EMLA) cream Apply to affected area once 30 g 3  . ondansetron (ZOFRAN) 8 MG tablet Take 1 tablet (8 mg total) by mouth 2 (two) times daily as needed. Start on the third day after chemotherapy. 30 tablet 2  . prochlorperazine (COMPAZINE) 10 MG tablet Take 1 tablet (10 mg total) by mouth every 6 (six) hours as needed (Nausea or vomiting). 30 tablet 2   No current facility-administered medications for this visit.   Facility-Administered Medications Ordered in Other Visits  Medication Dose Route Frequency Provider Last Rate Last Admin  . dexamethasone (DECADRON) 20 mg in sodium chloride 0.9 % 50 mL IVPB  20 mg Intravenous Once Truitt Merle, MD      . famotidine (PEPCID) IVPB 20 mg premix  20 mg Intravenous Once [place],  Krista Blue, MD 200 mL/hr at 07/01/19 1319 20 mg at 07/01/19 1319  . heparin lock flush 100 unit/mL  500 Units Intracatheter Once PRN Truitt Merle, MD      . loratadine (CLARITIN) tablet 10 mg  10 mg Oral Daily Truitt Merle, MD   10 mg at 07/01/19 1317  . PACLitaxel (TAXOL) 126 mg in sodium chloride 0.9 % 250 mL chemo infusion (> 60m/m2)  70 mg/m2 (Treatment Plan Recorded) Intravenous Once FTruitt Merle MD      . sodium chloride flush (NS) 0.9 % injection 10 mL  10 mL Intracatheter PRN FTruitt Merle MD        PHYSICAL EXAMINATION: ECOG PERFORMANCE STATUS: 1 - Symptomatic but completely ambulatory BP 120/65 P 92, RR 16, T 98.2  GENERAL:alert, no distress and comfortable SKIN: no rash    EYES: sclera clear OROPHARYNX: no thrush or ulcers NECK: without mass LUNGS: clear with normal breathing effort HEART: regular rate & rhythm, no lower extremity edema ABDOMEN: abdomen soft, non-tender and normal bowel sounds NEURO: alert & oriented x 3 with fluent speech Breast exam deferred PAC without erythema   LABORATORY DATA:  I have reviewed the data as listed CBC Latest Ref Rng & Units 07/01/2019 06/24/2019 06/17/2019  WBC 4.0 - 10.5 K/uL 2.9(L) 1.9(L) 2.9(L)  Hemoglobin 12.0 - 15.0 g/dL 11.4(L) 10.2(L) 10.5(L)  Hematocrit 36.0 - 46.0 % 34.5(L) 29.7(L) 31.0(L)  Platelets 150 - 400 K/uL 232 192 191     CMP Latest Ref Rng & Units 07/01/2019 06/24/2019 06/17/2019  Glucose 70 - 99 mg/dL 102(H) 129(H) 99  BUN 8 - 23 mg/dL _0 Creatinine 0.44 - 1.00 mg/dL 0.73 0.78 0.75  Sodium 135 - 145 mmol/L 138 138 141  Potassium 3.5 - 5.1 mmol/L 4.0 4.1 4.2  Chloride 98 - 111 mmol/L 107 105 106  CO2 22 - 32 mmol/L _1 Calcium 8.9 - 10.3 mg/dL 8.8(L) 8.7(L) 8.7(L)  Total Protein 6.5 - 8.1 g/dL 6.7 6.1(L) 6.3(L)  Total Bilirubin 0.3 - 1.2 mg/dL 0.4 0.4 0.3  Alkaline Phos 38 - 126 U/L 62 52 58  AST 15 - 41 U/L _2 ALT 0 - 44 U/L 36 42 35      RADIOGRAPHIC STUDIES: I have personally reviewed the radiological images as listed and agreed with the findings in the report. No results found.   ASSESSMENT & PLAN: Cindy RoscherMcIntoshis a 69y.o.femalewith   1.Malignant neoplasm of upper-inner quadrant of left breast, StageIIA,p(T2N0M0), ER/PR/HER2-,triple negative,GradeIII -She was diagnosed in 02/2019. She underwent left breast lumpectomy with SLNB on 03/10/19. -staging work up showed no definite evidence of metastasis  -due to her aggressive triple negative breast cancer, adjuvant chemo was recommended to reduce recurrence risk. She completed ddAC q2 weeks x4 (dose reduced C4) from 04/08/19 to 05/20/19. She did not tolerate well with decreased appetite, n/v, neutropenia and  anemia requiring blood transfusion  -She began weekly taxol on 12/29, tolerated much better than AC, mild nausea, constipation and body aches, she recovered well  -she developed neutropenia after week 2 and treatment was postponed, afebrile. She recovered well.   2. Genetic Testing  -Her mother had colon cancer and father had bladder cancer, her MGF had cancer and her maternal great grandmother had breast cancer.  -Will check with Genetics to see if she is eligible for testing. She is interested ifinsurance covers it.  3. anemia -Secondary to chemo, required RBC transfusion on 12/23 after cycle 4 AC. -  improved   4. Acid reflux  -Does cause her to cough.  -previously on prilosec but lost its efficacy, currently mild without treatment. -monitoring  -not discussed today   5. Nausea and Vomiting, Loss of appetite, weight loss -Hernausea worsened with cycles 3 and 4 of AC, but has been mostly controlled with antiemetics.  -appetite improved after she completed [facility] -monitor    Disposition:  Ms. Rosalita Chessman appears stable. She completed 2 cycles of weekly taxol 80 mg/m2. Week 3 was postponed due to neutropenia ANC 0.8, she declined granix. She otherwise tolerates treatment well overall. CBC improved today, ANC 1.7. CMP stable. Patient does not want granix in the future due to side effects and exorbitant cost. Given that she would likely require Peg/filgrastim support with increased taxol doses on q2 or q3 week regimens, I am recommending dose reduction to taxol 70 mg/m2 to keep her on weekly treatment, which patient prefers, and to hopefully prevent further neutropenia and treatment delays. I reviewed the case with Dr. Burr Medico and pharmacy. Ms. Rimmer agrees with the plan.   She will return for next cycle in 1 week, f/u in 2 weeks.    All questions were answered. The patient knows to call the clinic with any problems, questions or concerns. No barriers to learning was detected.     Alla Feeling, NP 07/01/19

## 2019-07-01 ENCOUNTER — Other Ambulatory Visit: Payer: Self-pay

## 2019-07-01 ENCOUNTER — Inpatient Hospital Stay (HOSPITAL_BASED_OUTPATIENT_CLINIC_OR_DEPARTMENT_OTHER): Payer: PPO | Admitting: Nurse Practitioner

## 2019-07-01 ENCOUNTER — Inpatient Hospital Stay: Payer: PPO

## 2019-07-01 ENCOUNTER — Encounter: Payer: Self-pay | Admitting: Nurse Practitioner

## 2019-07-01 VITALS — BP 120/65 | HR 92 | Temp 98.2°F | Resp 16

## 2019-07-01 DIAGNOSIS — Z171 Estrogen receptor negative status [ER-]: Secondary | ICD-10-CM | POA: Diagnosis not present

## 2019-07-01 DIAGNOSIS — C50212 Malignant neoplasm of upper-inner quadrant of left female breast: Secondary | ICD-10-CM

## 2019-07-01 DIAGNOSIS — Z5111 Encounter for antineoplastic chemotherapy: Secondary | ICD-10-CM | POA: Diagnosis not present

## 2019-07-01 DIAGNOSIS — T451X5A Adverse effect of antineoplastic and immunosuppressive drugs, initial encounter: Secondary | ICD-10-CM

## 2019-07-01 DIAGNOSIS — D701 Agranulocytosis secondary to cancer chemotherapy: Secondary | ICD-10-CM

## 2019-07-01 LAB — CMP (CANCER CENTER ONLY)
ALT: 36 U/L (ref 0–44)
AST: 25 U/L (ref 15–41)
Albumin: 4 g/dL (ref 3.5–5.0)
Alkaline Phosphatase: 62 U/L (ref 38–126)
Anion gap: 7 (ref 5–15)
BUN: 15 mg/dL (ref 8–23)
CO2: 24 mmol/L (ref 22–32)
Calcium: 8.8 mg/dL — ABNORMAL LOW (ref 8.9–10.3)
Chloride: 107 mmol/L (ref 98–111)
Creatinine: 0.73 mg/dL (ref 0.44–1.00)
GFR, Est AFR Am: >60 mL/min (ref >60)
GFR, Estimated: >60 mL/min (ref >60)
Glucose, Bld: 102 mg/dL — ABNORMAL HIGH (ref 70–99)
Potassium: 4 mmol/L (ref 3.5–5.1)
Sodium: 138 mmol/L (ref 135–145)
Total Bilirubin: 0.4 mg/dL (ref 0.3–1.2)
Total Protein: 6.7 g/dL (ref 6.5–8.1)

## 2019-07-01 LAB — CBC WITH DIFFERENTIAL (CANCER CENTER ONLY)
Abs Immature Granulocytes: 0.01 10*3/uL (ref 0.00–0.07)
Basophils Absolute: 0 10*3/uL (ref 0.0–0.1)
Basophils Relative: 1 %
Eosinophils Absolute: 0.1 10*3/uL (ref 0.0–0.5)
Eosinophils Relative: 2 %
HCT: 34.5 % — ABNORMAL LOW (ref 36.0–46.0)
Hemoglobin: 11.4 g/dL — ABNORMAL LOW (ref 12.0–15.0)
Immature Granulocytes: 0 %
Lymphocytes Relative: 26 %
Lymphs Abs: 0.8 10*3/uL (ref 0.7–4.0)
MCH: 31.8 pg (ref 26.0–34.0)
MCHC: 33 g/dL (ref 30.0–36.0)
MCV: 96.1 fL (ref 80.0–100.0)
Monocytes Absolute: 0.4 10*3/uL (ref 0.1–1.0)
Monocytes Relative: 15 %
Neutro Abs: 1.7 10*3/uL (ref 1.7–7.7)
Neutrophils Relative %: 56 %
Platelet Count: 232 10*3/uL (ref 150–400)
RBC: 3.59 MIL/uL — ABNORMAL LOW (ref 3.87–5.11)
RDW: 16.5 % — ABNORMAL HIGH (ref 11.5–15.5)
WBC Count: 2.9 10*3/uL — ABNORMAL LOW (ref 4.0–10.5)
nRBC: 0 % (ref 0.0–0.2)

## 2019-07-01 MED ORDER — LORATADINE 10 MG PO TABS
ORAL_TABLET | ORAL | Status: AC
  Filled 2019-07-01: qty 1

## 2019-07-01 MED ORDER — FAMOTIDINE IN NACL 20-0.9 MG/50ML-% IV SOLN
INTRAVENOUS | Status: AC
  Filled 2019-07-01: qty 50

## 2019-07-01 MED ORDER — HEPARIN SOD (PORK) LOCK FLUSH 100 UNIT/ML IV SOLN
500.0000 [IU] | Freq: Once | INTRAVENOUS | Status: AC | PRN
  Administered 2019-07-01: 16:00:00 500 [IU]
  Filled 2019-07-01: qty 5

## 2019-07-01 MED ORDER — FAMOTIDINE IN NACL 20-0.9 MG/50ML-% IV SOLN
20.0000 mg | Freq: Once | INTRAVENOUS | Status: AC
  Administered 2019-07-01: 13:00:00 20 mg via INTRAVENOUS

## 2019-07-01 MED ORDER — LORATADINE 10 MG PO TABS
10.0000 mg | ORAL_TABLET | Freq: Every day | ORAL | Status: DC
  Administered 2019-07-01: 10 mg via ORAL

## 2019-07-01 MED ORDER — SODIUM CHLORIDE 0.9% FLUSH
10.0000 mL | INTRAVENOUS | Status: DC | PRN
  Administered 2019-07-01: 16:00:00 10 mL
  Filled 2019-07-01: qty 10

## 2019-07-01 MED ORDER — SODIUM CHLORIDE 0.9 % IV SOLN
70.0000 mg/m2 | Freq: Once | INTRAVENOUS | Status: AC
  Administered 2019-07-01: 15:00:00 126 mg via INTRAVENOUS
  Filled 2019-07-01: qty 21

## 2019-07-01 MED ORDER — SODIUM CHLORIDE 0.9 % IV SOLN: 80.0000 mg/m2 | Freq: Once | INTRAVENOUS | Status: DC

## 2019-07-01 MED ORDER — SODIUM CHLORIDE 0.9 % IV SOLN
20.0000 mg | Freq: Once | INTRAVENOUS | Status: AC
  Administered 2019-07-01: 14:00:00 20 mg via INTRAVENOUS
  Filled 2019-07-01: qty 20

## 2019-07-01 MED ORDER — SODIUM CHLORIDE 0.9 % IV SOLN
Freq: Once | INTRAVENOUS | Status: AC
  Filled 2019-07-01: qty 250

## 2019-07-01 NOTE — Patient Instructions (Signed)

## 2019-07-01 NOTE — Progress Notes (Signed)
Spoke w/ Lacie/Dr. Burr Medico, paclitaxel dose is to be decreased today to 70 mg/m2 today and for future cycles. No scheduled nivestym injections at this time per patient request - leave in supportive care plan for now but with order instructions to only release if instructed my MD.   Demetrius Charity, PharmD, [facility] Pharmacist Pharmacy Phone: [phone] 07/01/2019

## 2019-07-02 ENCOUNTER — Telehealth: Payer: Self-pay | Admitting: Nurse Practitioner

## 2019-07-02 NOTE — Telephone Encounter (Signed)
Scheduled appt per 1/20 los. 

## 2019-07-08 ENCOUNTER — Inpatient Hospital Stay: Payer: PPO

## 2019-07-08 ENCOUNTER — Ambulatory Visit: Payer: PPO

## 2019-07-08 ENCOUNTER — Other Ambulatory Visit: Payer: PPO

## 2019-07-08 ENCOUNTER — Other Ambulatory Visit: Payer: Self-pay

## 2019-07-08 VITALS — BP 119/71 | HR 95 | Temp 98.3°F | Resp 16 | Wt 151.5 lb

## 2019-07-08 DIAGNOSIS — C50212 Malignant neoplasm of upper-inner quadrant of left female breast: Secondary | ICD-10-CM

## 2019-07-08 DIAGNOSIS — Z5111 Encounter for antineoplastic chemotherapy: Secondary | ICD-10-CM | POA: Diagnosis not present

## 2019-07-08 DIAGNOSIS — Z95828 Presence of other vascular implants and grafts: Secondary | ICD-10-CM | POA: Insufficient documentation

## 2019-07-08 DIAGNOSIS — Z171 Estrogen receptor negative status [ER-]: Secondary | ICD-10-CM

## 2019-07-08 LAB — CBC WITH DIFFERENTIAL (CANCER CENTER ONLY)
Abs Immature Granulocytes: 0.02 10*3/uL (ref 0.00–0.07)
Basophils Absolute: 0 10*3/uL (ref 0.0–0.1)
Basophils Relative: 0 %
Eosinophils Absolute: 0.1 10*3/uL (ref 0.0–0.5)
Eosinophils Relative: 3 %
HCT: 30.9 % — ABNORMAL LOW (ref 36.0–46.0)
Hemoglobin: 10.5 g/dL — ABNORMAL LOW (ref 12.0–15.0)
Immature Granulocytes: 1 %
Lymphocytes Relative: 26 %
Lymphs Abs: 0.7 10*3/uL (ref 0.7–4.0)
MCH: 32.6 pg (ref 26.0–34.0)
MCHC: 34 g/dL (ref 30.0–36.0)
MCV: 96 fL (ref 80.0–100.0)
Monocytes Absolute: 0.3 10*3/uL (ref 0.1–1.0)
Monocytes Relative: 13 %
Neutro Abs: 1.5 10*3/uL — ABNORMAL LOW (ref 1.7–7.7)
Neutrophils Relative %: 57 %
Platelet Count: 186 10*3/uL (ref 150–400)
RBC: 3.22 MIL/uL — ABNORMAL LOW (ref 3.87–5.11)
RDW: 15.6 % — ABNORMAL HIGH (ref 11.5–15.5)
WBC Count: 2.6 10*3/uL — ABNORMAL LOW (ref 4.0–10.5)
nRBC: 0 % (ref 0.0–0.2)

## 2019-07-08 LAB — CMP (CANCER CENTER ONLY)
ALT: 36 U/L (ref 0–44)
AST: 22 U/L (ref 15–41)
Albumin: 3.8 g/dL (ref 3.5–5.0)
Alkaline Phosphatase: 58 U/L (ref 38–126)
Anion gap: 7 (ref 5–15)
BUN: 17 mg/dL (ref 8–23)
CO2: 27 mmol/L (ref 22–32)
Calcium: 8.8 mg/dL — ABNORMAL LOW (ref 8.9–10.3)
Chloride: 105 mmol/L (ref 98–111)
Creatinine: 0.77 mg/dL (ref 0.44–1.00)
GFR, Est AFR Am: >60 mL/min (ref >60)
GFR, Estimated: >60 mL/min (ref >60)
Glucose, Bld: 111 mg/dL — ABNORMAL HIGH (ref 70–99)
Potassium: 4.2 mmol/L (ref 3.5–5.1)
Sodium: 139 mmol/L (ref 135–145)
Total Bilirubin: 0.4 mg/dL (ref 0.3–1.2)
Total Protein: 6.3 g/dL — ABNORMAL LOW (ref 6.5–8.1)

## 2019-07-08 MED ORDER — SODIUM CHLORIDE 0.9% FLUSH
10.0000 mL | Freq: Once | INTRAVENOUS | Status: AC
  Administered 2019-07-08: 10 mL
  Filled 2019-07-08: qty 10

## 2019-07-08 MED ORDER — LORATADINE 10 MG PO TABS
ORAL_TABLET | ORAL | Status: AC
  Filled 2019-07-08: qty 1

## 2019-07-08 MED ORDER — FAMOTIDINE IN NACL 20-0.9 MG/50ML-% IV SOLN
INTRAVENOUS | Status: AC
  Filled 2019-07-08: qty 50

## 2019-07-08 MED ORDER — SODIUM CHLORIDE 0.9 % IV SOLN
20.0000 mg | Freq: Once | INTRAVENOUS | Status: AC
  Administered 2019-07-08: 20 mg via INTRAVENOUS
  Filled 2019-07-08: qty 20

## 2019-07-08 MED ORDER — HEPARIN SOD (PORK) LOCK FLUSH 100 UNIT/ML IV SOLN
500.0000 [IU] | Freq: Once | INTRAVENOUS | Status: AC | PRN
  Administered 2019-07-08: 500 [IU]
  Filled 2019-07-08: qty 5

## 2019-07-08 MED ORDER — FAMOTIDINE IN NACL 20-0.9 MG/50ML-% IV SOLN
20.0000 mg | Freq: Once | INTRAVENOUS | Status: AC
  Administered 2019-07-08: 20 mg via INTRAVENOUS

## 2019-07-08 MED ORDER — SODIUM CHLORIDE 0.9 % IV SOLN
70.0000 mg/m2 | Freq: Once | INTRAVENOUS | Status: AC
  Administered 2019-07-08: 126 mg via INTRAVENOUS
  Filled 2019-07-08: qty 21

## 2019-07-08 MED ORDER — LORATADINE 10 MG PO TABS
10.0000 mg | ORAL_TABLET | Freq: Once | ORAL | Status: AC
  Administered 2019-07-08: 10 mg via ORAL

## 2019-07-08 MED ORDER — SODIUM CHLORIDE 0.9 % IV SOLN
Freq: Once | INTRAVENOUS | Status: AC
  Filled 2019-07-08: qty 250

## 2019-07-08 MED ORDER — SODIUM CHLORIDE 0.9% FLUSH
10.0000 mL | INTRAVENOUS | Status: DC | PRN
  Administered 2019-07-08: 10 mL
  Filled 2019-07-08: qty 10

## 2019-07-08 NOTE — Patient Instructions (Signed)
[facility] Discharge Instructions for Patients Receiving Chemotherapy  Today you received the following chemotherapy agents Paclitaxel  To help prevent nausea and vomiting after your treatment, we encourage you to take your nausea medication as directed   If you develop nausea and vomiting that is not controlled by your nausea medication, call the clinic.   BELOW ARE SYMPTOMS THAT SHOULD BE REPORTED IMMEDIATELY:  *FEVER GREATER THAN 100.5 F  *CHILLS WITH OR WITHOUT FEVER  NAUSEA AND VOMITING THAT IS NOT CONTROLLED WITH YOUR NAUSEA MEDICATION  *UNUSUAL SHORTNESS OF BREATH  *UNUSUAL BRUISING OR BLEEDING  TENDERNESS IN MOUTH AND THROAT WITH OR WITHOUT PRESENCE OF ULCERS  *URINARY PROBLEMS  *BOWEL PROBLEMS  UNUSUAL RASH Items with * indicate a potential emergency and should be followed up as soon as possible.  Feel free to call the clinic should you have any questions or concerns. The clinic phone number is [phone].  Please show the CHEMO ALERT CARD at check-in to the Emergency Department and triage nurse.   

## 2019-07-09 NOTE — Progress Notes (Signed)
[facility]   [phone] Fax:([phone]) [phone]   Clinic Follow up Note   Patient Care Team: Mosie Lukes, MD as PCP - General (Family Medicine) Cherylann Banas, Cherly Anderson, MD (Inactive) (Obstetrics and Gynecology) Rockwell Germany, RN as Oncology Nurse Navigator Mauro Kaufmann, RN as Oncology Nurse Navigator Erroll Luna, MD as Consulting Physician (General Surgery) Truitt Merle, MD as Consulting Physician (Hematology) Gery Pray, MD as Consulting Physician (Radiation Oncology)  Date of Service:  07/15/2019  CHIEF COMPLAINT: F/u of left breast cancer  SUMMARY OF ONCOLOGIC HISTORY: Oncology History Overview Note  Cancer Staging Malignant neoplasm of upper-inner quadrant of left breast in female, estrogen receptor negative ([place]) Staging form: Breast, AJCC 8th Edition - Clinical stage from 02/06/2019: Stage IB (cT1c, cN0, cM0, G2, ER-, PR-, HER2-) - Signed by Truitt Merle, MD on 02/17/2019 - Pathologic stage from 03/10/2019: Stage IIA (pT2, pN0, cM0, G3, ER-, PR-, HER2-) - Signed by Truitt Merle, MD on 03/12/2019    Malignant neoplasm of upper-inner quadrant of left breast in female, estrogen receptor negative ([place])  02/04/2019 Mammogram   Diagnostic mammogram 02/04/19  IMPRESSION: Highly suspicious 1.7 x1.5 x1.7cm at 10:00 position of UPPER INNER LEFT breast mass 10 cm from nipple corresponding to the screening study finding. Tissue sampling recommended.   No abnormal LEFT axillary lymph nodes.   02/06/2019 Cancer Staging   Staging form: Breast, AJCC 8th Edition - Clinical stage from 02/06/2019: Stage IB (cT1c, cN0, cM0, G2, ER-, PR-, HER2-) - Signed by Truitt Merle, MD on 02/17/2019   02/06/2019 Initial Biopsy   Diagnosis 02/06/19 Breast, left, needle core biopsy, 10 o'clock, 10cm/n, ribbon clip - INVASIVE MAMMARY CARCINOMA.   02/06/2019 Receptors her2   Results: IMMUNOHISTOCHEMICAL AND MORPHOMETRIC ANALYSIS PERFORMED MANUALLY The tumor cells are NEGATIVE for Her2  (1+). Estrogen Receptor: 0%, NEGATIVE Progesterone Receptor: 0%, NEGATIVE Proliferation Marker Ki67: 40%   02/17/2019 Initial Diagnosis   Malignant neoplasm of upper-inner quadrant of left breast in female, estrogen receptor negative ([place])   03/10/2019 Cancer Staging   Staging form: Breast, AJCC 8th Edition - Pathologic stage from 03/10/2019: Stage IIA (pT2, pN0, cM0, G3, ER-, PR-, HER2-) - Signed by Truitt Merle, MD on 03/12/2019   03/10/2019 Surgery   LEFT BREAST LUMPECTOMY WITH RADIOACTIVE SEED AND LEFT SENTINEL LYMPH NODE MAPPING and PAC placement  by Dr. Brantley Stage  03/10/19   03/10/2019 Pathology Results   DIAGNOSIS: 03/10/19  A. BREAST, LEFT, LUMPECTOMY:  - Invasive ductal carcinoma, grade 3, 2.5 cm  - Carcinoma is 0.3 cm from superior margin  - Medial resection edge is 0.1 cm from carcinoma (final medial margin is  represented by part 2 which is negative for carcinoma)  - Negative for lymphovascular or perineural invasion  - Biopsy site changes  - See oncology table   B. BREAST, LEFT MEDIAL MARGIN, EXCISION:  - Benign breast parenchyma, negative for carcinoma   C. LYMPH NODE, LEFT AXILLARY, SENTINEL BIOPSY:  - Lymph node, negative for carcinoma (0/1)    04/06/2019 Imaging   Bone scan IMPRESSION: Mild focus of uptake is seen involving the anterior portion of left upper rib, but there is no corresponding abnormality seen on the CT scan of the same day. Potentially this may represent degenerative change or secondary to overlying postsurgical change. Attention on follow-up is recommended.   No definite scintigraphic evidence of osseous metastases.   04/06/2019 Imaging   CT CAP IMPRESSION: 1. Surgical changes left breast with borderline enlarged left axillary lymph node. No  other definite evidence for metastatic disease in the chest, abdomen, or pelvis. 2. Tiny bilateral pulmonary nodules. Attention on follow-up recommended. 3. Scattered small hypoattenuating lesions in the  liver parenchyma. Some of these have attenuation higher than would be expected for simple fluid and may be cysts complicated by proteinaceous debris or hemorrhage. MRI without and with contrast recommended to further evaluate. 4. Small to moderate hiatal hernia. 5. Aortic Atherosclerosis (ICD10-I70.0).   04/08/2019 -  Chemotherapy   chemo AC q2weeks for 4 cycles starting 04/08/19-05/20/19 followed by Taxol weekly for 12 weeks starting 06/09/19.        CURRENT THERAPY:  chemo AC q2weeks for 4 cycles starting 04/08/19-05/20/19 followed by Taxol q2weeks starting 06/09/19.   INTERVAL HISTORY:  KEYANDRA SWENSON is here for a follow up and treatment. She presents to the clinic alone. She notes she is tolerating treatment well except for fatigue and exhaustion. She notes this lasts the whole week. She notes she can do activities and tasks she needs but will be tired. She notes she lays down often. She denies N&V or syncope. She will occasionally by lightheaded but mildly. She noes normal BM and denies neuropathy. She has not used ice bags during chemo.      REVIEW OF SYSTEMS:   Constitutional: Denies fevers, chills or abnormal weight loss (+) Fatigue (+) Occasional lightheadedness  Eyes: Denies blurriness of vision Ears, nose, mouth, throat, and face: Denies mucositis or sore throat Respiratory: Denies cough, dyspnea or wheezes Cardiovascular: Denies palpitation, chest discomfort or lower extremity swelling Gastrointestinal:  Denies nausea, heartburn or change in bowel habits Skin: Denies abnormal skin rashes Lymphatics: Denies new lymphadenopathy or easy bruising Neurological:Denies numbness, tingling or new weaknesses Behavioral/Psych: Mood is stable, no new changes  All other systems were reviewed with the patient and are negative.  MEDICAL HISTORY:  Past Medical History:  Diagnosis Date  . Allergy   . Depression    ? if bipolar works the best  . Elevated fasting blood sugar     hx  . H/O measles   . Headache   . History of chicken pox   . Hx of migraines   . Microscopic hematuria    advised to see urology ? never went  . Preventative health care 06/20/2011    SURGICAL HISTORY: Past Surgical History:  Procedure Laterality Date  . BREAST LUMPECTOMY WITH RADIOACTIVE SEED AND SENTINEL LYMPH NODE BIOPSY Left 03/10/2019   Procedure: LEFT BREAST LUMPECTOMY WITH RADIOACTIVE SEED AND LEFT SENTINEL LYMPH NODE MAPPING;  Surgeon: Erroll Luna, MD;  Location: [facility];  Service: General;  Laterality: Left;  . endometrial polyps     2 surgeries by Dr Cathlean Cower  . EYE SURGERY  04/29/2009   Rt eye tighten up  . PORTACATH PLACEMENT N/A 03/10/2019   Procedure: INSERTION PORT-A-CATH WITH ULTRASOUND;  Surgeon: Erroll Luna, MD;  Location: [facility];  Service: General;  Laterality: N/A;  . steel plate in skull Left    from a crushed skull    I have reviewed the social history and family history with the patient and they are unchanged from previous note.  ALLERGIES:  is allergic to bromfed and venlafaxine.  MEDICATIONS:  Current Outpatient Medications  Medication Sig Dispense Refill  . FLUoxetine (PROZAC) 40 MG capsule Take 1 capsule (40 mg total) by mouth daily. 90 capsule 0  . lidocaine-prilocaine (EMLA) cream Apply to affected area once 30 g 3  . ondansetron (ZOFRAN) 8 MG tablet Take 1 tablet (8 mg  total) by mouth 2 (two) times daily as needed. Start on the third day after chemotherapy. 30 tablet 2  . prochlorperazine (COMPAZINE) 10 MG tablet Take 1 tablet (10 mg total) by mouth every 6 (six) hours as needed (Nausea or vomiting). 30 tablet 2   No current facility-administered medications for this visit.    PHYSICAL EXAMINATION: ECOG PERFORMANCE STATUS: 1 - Symptomatic but completely ambulatory  Vitals:   07/15/19 1326  BP: 126/83  Pulse: (!) 121  Resp: 18  Temp: 98.3 F (36.8 C)  SpO2: 99%   Filed Weights   07/15/19 1326  Weight: 151 lb 6.4 oz (68.7 kg)     GENERAL:alert, no distress and comfortable SKIN: skin color, texture, turgor are normal, no rashes or significant lesions EYES: normal, Conjunctiva are pink and non-injected, sclera clear  NECK: supple, thyroid normal size, non-tender, without nodularity LYMPH:  no palpable lymphadenopathy in the cervical, axillary  LUNGS: clear to auscultation and percussion with normal breathing effort HEART: regular rate & rhythm and no murmurs and no lower extremity edema ABDOMEN:abdomen soft, non-tender and normal bowel sounds Musculoskeletal:no cyanosis of digits and no clubbing  NEURO: alert & oriented x 3 with fluent speech, no focal motor/sensory deficits BREAST: S/p left lumpectomy: Surgical incision healed well. No palpable mass, nodules or adenopathy bilaterally. Breast exam benign.   LABORATORY DATA:  I have reviewed the data as listed CBC Latest Ref Rng & Units 07/15/2019 07/08/2019 07/01/2019  WBC 4.0 - 10.5 K/uL 1.7(L) 2.6(L) 2.9(L)  Hemoglobin 12.0 - 15.0 g/dL 10.2(L) 10.5(L) 11.4(L)  Hematocrit 36.0 - 46.0 % 30.1(L) 30.9(L) 34.5(L)  Platelets 150 - 400 K/uL 218 186 232     CMP Latest Ref Rng & Units 07/15/2019 07/08/2019 07/01/2019  Glucose 70 - 99 mg/dL 103(H) 111(H) 102(H)  BUN 8 - 23 mg/dL _0 Creatinine 0.44 - 1.00 mg/dL 0.81 0.77 0.73  Sodium 135 - 145 mmol/L 141 139 138  Potassium 3.5 - 5.1 mmol/L 4.0 4.2 4.0  Chloride 98 - 111 mmol/L 105 105 107  CO2 22 - 32 mmol/L _1 Calcium 8.9 - 10.3 mg/dL 9.1 8.8(L) 8.8(L)  Total Protein 6.5 - 8.1 g/dL 6.5 6.3(L) 6.7  Total Bilirubin 0.3 - 1.2 mg/dL 0.5 0.4 0.4  Alkaline Phos 38 - 126 U/L 54 58 62  AST 15 - 41 U/L _2 ALT 0 - 44 U/L 45(H) 36 36      RADIOGRAPHIC STUDIES: I have personally reviewed the radiological images as listed and agreed with the findings in the report. No results found.   ASSESSMENT & PLAN:  Cindy [name] is a 68 y.o. female with    1.Malignant neoplasm of upper-inner quadrant of  left breast, StageIIA,p(T2N0M0), ER/PR/HER2-,triple negative,GradeIII -She was diagnosed in 02/2019. She underwent left breast lumpectomy with SLNB on 03/10/19. -Given her aggressive triple negative breast cancer, she was treated with adjuvant chemoAC q2weeks for 4 cycles. She is currently receiving Taxol q2weeks for 4 cycles since 06/09/19. Dose has been reduced due to neutropenia.  -S/p C4 Taxol she tolerated well with persistent fatigue. She has been managing this. I encouraged her to use ice bags during infusion to reduce risk of neuropathy. I also strongly encouraged her to increase activity level at home.  -Physical exam unremarkable. Labs reviewed, WBC 1.7, ANC 1, HG 10.2. Overall adequate to proceed with C5 Taxol at same dose.  -I discussed restarting Granix injection this week. She declined. I recommend she skip  Taxol next week to allow her counts to improve. She is agreeable.  -I discussed she is at increased risk for infection. I encouraged her to continue infection and COVID19 precautions and try to stay home next week.  -F/u in 2 weeks   2. Neutropenia  -Secondary to Taxol with WBC 1.7 and ANC 1 today (07/15/19) -I recommend restarting Granix injection this week. She declined. I recommend she skip Taxol next week to allow her counts to improve. She is agreeable.  -Will continue to monitor.   3. Genetic Testing  -Her mother had colon cancer and father had bladder cancer, her MGF had cancer and her maternal great grandmother had breast cancer.  -I will check with Genetics to see if she is eligible for testing. She is interested if insurance covers it.  4. Anemia -Secondary to chemo. -I encouraged her to start multivitamin. -Improved with blood transfusion on 12/23  -Mild and stable on Taxol  5. Acid reflux  -Does cause her to cough.  -ContinuePrilosec OTC.    PLAN: -Labs reviewed proceed with C5 Taxol today -Lab, flush and Taxol in 2, 3, 4 weeks, will try to see  her in infusion to reduce her waiting time, per pt's request  -F/u in 2 weeks    No problem-specific Assessment & Plan notes found for this encounter.   No orders of the defined types were placed in this encounter.  All questions were answered. The patient knows to call the clinic with any problems, questions or concerns. No barriers to learning was detected. The total time spent in the appointment was 30 minutes.     Truitt Merle, MD 07/15/2019   I, Joslyn Devon, am acting as scribe for Truitt Merle, MD.   I have reviewed the above documentation for accuracy and completeness, and I agree with the above.

## 2019-07-14 ENCOUNTER — Encounter: Payer: Self-pay | Admitting: *Deleted

## 2019-07-15 ENCOUNTER — Ambulatory Visit: Payer: PPO

## 2019-07-15 ENCOUNTER — Inpatient Hospital Stay (HOSPITAL_BASED_OUTPATIENT_CLINIC_OR_DEPARTMENT_OTHER): Payer: PPO | Admitting: Hematology

## 2019-07-15 ENCOUNTER — Other Ambulatory Visit: Payer: PPO

## 2019-07-15 ENCOUNTER — Inpatient Hospital Stay: Payer: PPO

## 2019-07-15 ENCOUNTER — Inpatient Hospital Stay: Payer: PPO | Attending: Hematology

## 2019-07-15 ENCOUNTER — Other Ambulatory Visit: Payer: Self-pay

## 2019-07-15 VITALS — HR 106

## 2019-07-15 VITALS — BP 126/83 | HR 121 | Temp 98.3°F | Resp 18 | Ht 64.0 in | Wt 151.4 lb

## 2019-07-15 DIAGNOSIS — R42 Dizziness and giddiness: Secondary | ICD-10-CM | POA: Insufficient documentation

## 2019-07-15 DIAGNOSIS — Z95828 Presence of other vascular implants and grafts: Secondary | ICD-10-CM

## 2019-07-15 DIAGNOSIS — Z5111 Encounter for antineoplastic chemotherapy: Secondary | ICD-10-CM | POA: Insufficient documentation

## 2019-07-15 DIAGNOSIS — C50212 Malignant neoplasm of upper-inner quadrant of left female breast: Secondary | ICD-10-CM

## 2019-07-15 DIAGNOSIS — D701 Agranulocytosis secondary to cancer chemotherapy: Secondary | ICD-10-CM | POA: Diagnosis not present

## 2019-07-15 DIAGNOSIS — T451X5A Adverse effect of antineoplastic and immunosuppressive drugs, initial encounter: Secondary | ICD-10-CM | POA: Diagnosis not present

## 2019-07-15 DIAGNOSIS — I700 Atherosclerosis of aorta: Secondary | ICD-10-CM | POA: Insufficient documentation

## 2019-07-15 DIAGNOSIS — Z800 Family history of malignant neoplasm of digestive organs: Secondary | ICD-10-CM | POA: Diagnosis not present

## 2019-07-15 DIAGNOSIS — Z803 Family history of malignant neoplasm of breast: Secondary | ICD-10-CM | POA: Diagnosis not present

## 2019-07-15 DIAGNOSIS — K219 Gastro-esophageal reflux disease without esophagitis: Secondary | ICD-10-CM | POA: Insufficient documentation

## 2019-07-15 DIAGNOSIS — Z171 Estrogen receptor negative status [ER-]: Secondary | ICD-10-CM | POA: Insufficient documentation

## 2019-07-15 DIAGNOSIS — F329 Major depressive disorder, single episode, unspecified: Secondary | ICD-10-CM | POA: Diagnosis not present

## 2019-07-15 DIAGNOSIS — D6481 Anemia due to antineoplastic chemotherapy: Secondary | ICD-10-CM | POA: Diagnosis not present

## 2019-07-15 DIAGNOSIS — Z79899 Other long term (current) drug therapy: Secondary | ICD-10-CM | POA: Diagnosis not present

## 2019-07-15 DIAGNOSIS — R5383 Other fatigue: Secondary | ICD-10-CM | POA: Insufficient documentation

## 2019-07-15 DIAGNOSIS — Z8052 Family history of malignant neoplasm of bladder: Secondary | ICD-10-CM | POA: Insufficient documentation

## 2019-07-15 LAB — CMP (CANCER CENTER ONLY)
ALT: 45 U/L — ABNORMAL HIGH (ref 0–44)
AST: 27 U/L (ref 15–41)
Albumin: 3.9 g/dL (ref 3.5–5.0)
Alkaline Phosphatase: 54 U/L (ref 38–126)
Anion gap: 10 (ref 5–15)
BUN: 13 mg/dL (ref 8–23)
CO2: 26 mmol/L (ref 22–32)
Calcium: 9.1 mg/dL (ref 8.9–10.3)
Chloride: 105 mmol/L (ref 98–111)
Creatinine: 0.81 mg/dL (ref 0.44–1.00)
GFR, Est AFR Am: >60 mL/min (ref >60)
GFR, Estimated: >60 mL/min (ref >60)
Glucose, Bld: 103 mg/dL — ABNORMAL HIGH (ref 70–99)
Potassium: 4 mmol/L (ref 3.5–5.1)
Sodium: 141 mmol/L (ref 135–145)
Total Bilirubin: 0.5 mg/dL (ref 0.3–1.2)
Total Protein: 6.5 g/dL (ref 6.5–8.1)

## 2019-07-15 LAB — CBC WITH DIFFERENTIAL (CANCER CENTER ONLY)
Abs Immature Granulocytes: 0 10*3/uL (ref 0.00–0.07)
Basophils Absolute: 0 10*3/uL (ref 0.0–0.1)
Basophils Relative: 1 %
Eosinophils Absolute: 0 10*3/uL (ref 0.0–0.5)
Eosinophils Relative: 2 %
HCT: 30.1 % — ABNORMAL LOW (ref 36.0–46.0)
Hemoglobin: 10.2 g/dL — ABNORMAL LOW (ref 12.0–15.0)
Immature Granulocytes: 0 %
Lymphocytes Relative: 32 %
Lymphs Abs: 0.6 10*3/uL — ABNORMAL LOW (ref 0.7–4.0)
MCH: 32.9 pg (ref 26.0–34.0)
MCHC: 33.9 g/dL (ref 30.0–36.0)
MCV: 97.1 fL (ref 80.0–100.0)
Monocytes Absolute: 0.1 10*3/uL (ref 0.1–1.0)
Monocytes Relative: 8 %
Neutro Abs: 1 10*3/uL — ABNORMAL LOW (ref 1.7–7.7)
Neutrophils Relative %: 57 %
Platelet Count: 218 10*3/uL (ref 150–400)
RBC: 3.1 MIL/uL — ABNORMAL LOW (ref 3.87–5.11)
RDW: 15.3 % (ref 11.5–15.5)
WBC Count: 1.7 10*3/uL — ABNORMAL LOW (ref 4.0–10.5)
nRBC: 0 % (ref 0.0–0.2)

## 2019-07-15 MED ORDER — SODIUM CHLORIDE 0.9% FLUSH
10.0000 mL | Freq: Once | INTRAVENOUS | Status: AC
  Administered 2019-07-15: 10 mL
  Filled 2019-07-15: qty 10

## 2019-07-15 MED ORDER — SODIUM CHLORIDE 0.9 % IV SOLN
70.0000 mg/m2 | Freq: Once | INTRAVENOUS | Status: AC
  Administered 2019-07-15: 126 mg via INTRAVENOUS
  Filled 2019-07-15: qty 21

## 2019-07-15 MED ORDER — LORATADINE 10 MG PO TABS
ORAL_TABLET | ORAL | Status: AC
  Filled 2019-07-15: qty 1

## 2019-07-15 MED ORDER — LORATADINE 10 MG PO TABS
10.0000 mg | ORAL_TABLET | Freq: Every day | ORAL | Status: DC
  Administered 2019-07-15: 10 mg via ORAL

## 2019-07-15 MED ORDER — FAMOTIDINE IN NACL 20-0.9 MG/50ML-% IV SOLN
INTRAVENOUS | Status: AC
  Filled 2019-07-15: qty 50

## 2019-07-15 MED ORDER — SODIUM CHLORIDE 0.9 % IV SOLN
Freq: Once | INTRAVENOUS | Status: AC
  Filled 2019-07-15: qty 250

## 2019-07-15 MED ORDER — FAMOTIDINE IN NACL 20-0.9 MG/50ML-% IV SOLN
20.0000 mg | Freq: Once | INTRAVENOUS | Status: AC
  Administered 2019-07-15: 20 mg via INTRAVENOUS

## 2019-07-15 MED ORDER — SODIUM CHLORIDE 0.9% FLUSH
10.0000 mL | INTRAVENOUS | Status: DC | PRN
  Administered 2019-07-15: 10 mL
  Filled 2019-07-15: qty 10

## 2019-07-15 MED ORDER — SODIUM CHLORIDE 0.9 % IV SOLN
20.0000 mg | Freq: Once | INTRAVENOUS | Status: AC
  Administered 2019-07-15: 15:00:00 20 mg via INTRAVENOUS
  Filled 2019-07-15: qty 20

## 2019-07-15 MED ORDER — HEPARIN SOD (PORK) LOCK FLUSH 100 UNIT/ML IV SOLN
500.0000 [IU] | Freq: Once | INTRAVENOUS | Status: AC | PRN
  Administered 2019-07-15: 500 [IU]
  Filled 2019-07-15: qty 5

## 2019-07-15 NOTE — Patient Instructions (Signed)
[facility] Discharge Instructions for Patients Receiving Chemotherapy  Today you received the following chemotherapy agents: paclitaxel.  To help prevent nausea and vomiting after your treatment, we encourage you to take your nausea medication as directed.   If you develop nausea and vomiting that is not controlled by your nausea medication, call the clinic.   BELOW ARE SYMPTOMS THAT SHOULD BE REPORTED IMMEDIATELY:  *FEVER GREATER THAN 100.5 F  *CHILLS WITH OR WITHOUT FEVER  NAUSEA AND VOMITING THAT IS NOT CONTROLLED WITH YOUR NAUSEA MEDICATION  *UNUSUAL SHORTNESS OF BREATH  *UNUSUAL BRUISING OR BLEEDING  TENDERNESS IN MOUTH AND THROAT WITH OR WITHOUT PRESENCE OF ULCERS  *URINARY PROBLEMS  *BOWEL PROBLEMS  UNUSUAL RASH Items with * indicate a potential emergency and should be followed up as soon as possible.  Feel free to call the clinic should you have any questions or concerns. The clinic phone number is [phone].  Please show the CHEMO ALERT CARD at check-in to the Emergency Department and triage nurse.   

## 2019-07-15 NOTE — Progress Notes (Signed)
Per Dr. Burr Medico ok to treat with ANC of 1.0. Pt will not receive granix.  Ms Cantrelle will not have treatment next week.

## 2019-07-15 NOTE — Progress Notes (Signed)
Per Dr. Feng, ok to treat with elevated HR.  

## 2019-07-16 ENCOUNTER — Telehealth: Payer: Self-pay | Admitting: Hematology

## 2019-07-16 ENCOUNTER — Encounter: Payer: Self-pay | Admitting: Hematology

## 2019-07-16 NOTE — Telephone Encounter (Signed)
Scheduled appt per 2/3 los.  Spoke with pt and she is aware of her appt time change.

## 2019-07-17 ENCOUNTER — Ambulatory Visit: Payer: PPO | Admitting: Family Medicine

## 2019-07-22 ENCOUNTER — Inpatient Hospital Stay: Payer: PPO

## 2019-07-22 ENCOUNTER — Ambulatory Visit: Payer: PPO

## 2019-07-22 ENCOUNTER — Other Ambulatory Visit: Payer: PPO

## 2019-07-23 NOTE — Progress Notes (Signed)
[place]   [phone] [phone]   Clinic Follow up Note   Patient Care Team: Mosie Lukes, MD as PCP - General (Family Medicine) Cherylann Banas, Cherly Anderson, MD (Inactive) (Obstetrics and Gynecology) Rockwell Germany, RN as Oncology Nurse Navigator Mauro Kaufmann, RN as Oncology Nurse Navigator Erroll Luna, MD as Consulting Physician (General Surgery) Truitt Merle, MD as Consulting Physician (Hematology) Gery Pray, MD as Consulting Physician (Radiation Oncology)  Date of Service:  07/29/2019  CHIEF COMPLAINT: F/u of left breast cancer  SUMMARY OF ONCOLOGIC HISTORY: Oncology History Overview Note  Cancer Staging Malignant neoplasm of upper-inner quadrant of left breast in female, estrogen receptor negative ([place]) Staging form: Breast, AJCC 8th Edition - Clinical stage from 02/06/2019: Stage IB (cT1c, cN0, cM0, G2, ER-, PR-, HER2-) - Signed by Truitt Merle, MD on 02/17/2019 - Pathologic stage from 03/10/2019: Stage IIA (pT2, pN0, cM0, G3, ER-, PR-, HER2-) - Signed by Truitt Merle, MD on 03/12/2019    Malignant neoplasm of upper-inner quadrant of left breast in female, estrogen receptor negative ([place])  02/04/2019 Mammogram   Diagnostic mammogram 02/04/19  IMPRESSION: Highly suspicious 1.7 x1.5 x1.7cm at 10:00 position of UPPER INNER LEFT breast mass 10 cm from nipple corresponding to the screening study finding. Tissue sampling recommended.   No abnormal LEFT axillary lymph nodes.   02/06/2019 Cancer Staging   Staging form: Breast, AJCC 8th Edition - Clinical stage from 02/06/2019: Stage IB (cT1c, cN0, cM0, G2, ER-, PR-, HER2-) - Signed by Truitt Merle, MD on 02/17/2019   02/06/2019 Initial Biopsy   Diagnosis 02/06/19 Breast, left, needle core biopsy, 10 o'clock, 10cm/n, ribbon clip - INVASIVE MAMMARY CARCINOMA.   02/06/2019 Receptors her2   Results: IMMUNOHISTOCHEMICAL AND MORPHOMETRIC ANALYSIS PERFORMED MANUALLY The tumor cells are NEGATIVE for Her2  (1+). Estrogen Receptor: 0%, NEGATIVE Progesterone Receptor: 0%, NEGATIVE Proliferation Marker Ki67: 40%   02/17/2019 Initial Diagnosis   Malignant neoplasm of upper-inner quadrant of left breast in female, estrogen receptor negative ([place])   03/10/2019 Cancer Staging   Staging form: Breast, AJCC 8th Edition - Pathologic stage from 03/10/2019: Stage IIA (pT2, pN0, cM0, G3, ER-, PR-, HER2-) - Signed by Truitt Merle, MD on 03/12/2019   03/10/2019 Surgery   LEFT BREAST LUMPECTOMY WITH RADIOACTIVE SEED AND LEFT SENTINEL LYMPH NODE MAPPING and PAC placement  by Dr. Brantley Stage  03/10/19   03/10/2019 Pathology Results   DIAGNOSIS: 03/10/19  A. BREAST, LEFT, LUMPECTOMY:  - Invasive ductal carcinoma, grade 3, 2.5 cm  - Carcinoma is 0.3 cm from superior margin  - Medial resection edge is 0.1 cm from carcinoma (final medial margin is  represented by part 2 which is negative for carcinoma)  - Negative for lymphovascular or perineural invasion  - Biopsy site changes  - See oncology table   B. BREAST, LEFT MEDIAL MARGIN, EXCISION:  - Benign breast parenchyma, negative for carcinoma   C. LYMPH NODE, LEFT AXILLARY, SENTINEL BIOPSY:  - Lymph node, negative for carcinoma (0/1)    04/06/2019 Imaging   Bone scan IMPRESSION: Mild focus of uptake is seen involving the anterior portion of left upper rib, but there is no corresponding abnormality seen on the CT scan of the same day. Potentially this may represent degenerative change or secondary to overlying postsurgical change. Attention on follow-up is recommended.   No definite scintigraphic evidence of osseous metastases.   04/06/2019 Imaging   CT CAP IMPRESSION: 1. Surgical changes left breast with borderline enlarged left axillary lymph node. No  other definite evidence for metastatic disease in the chest, abdomen, or pelvis. 2. Tiny bilateral pulmonary nodules. Attention on follow-up recommended. 3. Scattered small hypoattenuating lesions in the  liver parenchyma. Some of these have attenuation higher than would be expected for simple fluid and may be cysts complicated by proteinaceous debris or hemorrhage. MRI without and with contrast recommended to further evaluate. 4. Small to moderate hiatal hernia. 5. Aortic Atherosclerosis (ICD10-I70.0).   04/08/2019 -  Chemotherapy   chemo AC q2weeks for 4 cycles starting 04/08/19-05/20/19 followed by Taxol weekly for 12 weeks starting 06/09/19.        CURRENT THERAPY:  chemo AC q2weeks for 4 cycles starting 04/08/19-12/9/20followed by Taxol q2weeks starting 06/09/19.added Granix with C6.   INTERVAL HISTORY:  Cindy [name] is here for a follow up and treatment. She presents to the clinic alone. She notes she feels well. She denies neuropathy. She notes she is eating adequately and weight is stable. She is willing to start Granix injections.    REVIEW OF SYSTEMS:   Constitutional: Denies fevers, chills or abnormal weight loss Eyes: Denies blurriness of vision Ears, nose, mouth, throat, and face: Denies mucositis or sore throat Respiratory: Denies cough, dyspnea or wheezes Cardiovascular: Denies palpitation, chest discomfort or lower extremity swelling Gastrointestinal:  Denies nausea, heartburn or change in bowel habits Skin: Denies abnormal skin rashes Lymphatics: Denies new lymphadenopathy or easy bruising Neurological:Denies numbness, tingling or new weaknesses Behavioral/Psych: Mood is stable, no new changes  All other systems were reviewed with the patient and are negative.  MEDICAL HISTORY:  Past Medical History:  Diagnosis Date  . Allergy   . Depression    ? if bipolar works the best  . Elevated fasting blood sugar    hx  . H/O measles   . Headache   . History of chicken pox   . Hx of migraines   . Microscopic hematuria    advised to see urology ? never went  . Preventative health care 06/20/2011    SURGICAL HISTORY: Past Surgical History:  Procedure  Laterality Date  . BREAST LUMPECTOMY WITH RADIOACTIVE SEED AND SENTINEL LYMPH NODE BIOPSY Left 03/10/2019   Procedure: LEFT BREAST LUMPECTOMY WITH RADIOACTIVE SEED AND LEFT SENTINEL LYMPH NODE MAPPING;  Surgeon: Erroll Luna, MD;  Location: [facility];  Service: General;  Laterality: Left;  . endometrial polyps     2 surgeries by Dr Cathlean Cower  . EYE SURGERY  04/29/2009   Rt eye tighten up  . PORTACATH PLACEMENT N/A 03/10/2019   Procedure: INSERTION PORT-A-CATH WITH ULTRASOUND;  Surgeon: Erroll Luna, MD;  Location: [facility];  Service: General;  Laterality: N/A;  . steel plate in skull Left    from a crushed skull    I have reviewed the social history and family history with the patient and they are unchanged from previous note.  ALLERGIES:  is allergic to bromfed and venlafaxine.  MEDICATIONS:  Current Outpatient Medications  Medication Sig Dispense Refill  . FLUoxetine (PROZAC) 40 MG capsule Take 1 capsule (40 mg total) by mouth daily. 90 capsule 0  . lidocaine-prilocaine (EMLA) cream Apply to affected area once 30 g 3  . ondansetron (ZOFRAN) 8 MG tablet Take 1 tablet (8 mg total) by mouth 2 (two) times daily as needed. Start on the third day after chemotherapy. 30 tablet 2  . prochlorperazine (COMPAZINE) 10 MG tablet Take 1 tablet (10 mg total) by mouth every 6 (six) hours as needed (Nausea or vomiting). 30 tablet 2  No current facility-administered medications for this visit.   Facility-Administered Medications Ordered in Other Visits  Medication Dose Route Frequency Provider Last Rate Last Admin  . dexamethasone (DECADRON) 20 mg in sodium chloride 0.9 % 50 mL IVPB  20 mg Intravenous Once Truitt Merle, MD      . heparin lock flush 100 unit/mL  500 Units Intracatheter Once PRN Truitt Merle, MD      . loratadine (CLARITIN) tablet 10 mg  10 mg Oral Daily Truitt Merle, MD   10 mg at 07/29/19 1245  . PACLitaxel (TAXOL) 144 mg in sodium chloride 0.9 % 250 mL chemo infusion (> 41m/m2)  80 mg/m2  (Treatment Plan Recorded) Intravenous Once FTruitt Merle MD      . sodium chloride flush (NS) 0.9 % injection 10 mL  10 mL Intracatheter PRN FTruitt Merle MD        PHYSICAL EXAMINATION: ECOG PERFORMANCE STATUS: 1 - Symptomatic but completely ambulatory  Vitals:   07/29/19 1121  BP: 113/70  Pulse: (!) 106  Resp: 18  Temp: 98.4 F (36.9 C)  SpO2: 99%   Filed Weights   07/29/19 1121  Weight: 150 lb 4.8 oz (68.2 kg)    Due to COVID19 we will limit examination to appearance. Patient had no complaints.  GENERAL:alert, no distress and comfortable SKIN: skin color normal, no rashes or significant lesions EYES: normal, Conjunctiva are pink and non-injected, sclera clear  NEURO: alert & oriented x 3 with fluent speech   LABORATORY DATA:  I have reviewed the data as listed CBC Latest Ref Rng & Units 07/29/2019 07/15/2019 07/08/2019  WBC 4.0 - 10.5 K/uL 2.2(L) 1.7(L) 2.6(L)  Hemoglobin 12.0 - 15.0 g/dL 10.5(L) 10.2(L) 10.5(L)  Hematocrit 36.0 - 46.0 % 31.3(L) 30.1(L) 30.9(L)  Platelets 150 - 400 K/uL 243 218 186     CMP Latest Ref Rng & Units 07/29/2019 07/15/2019 07/08/2019  Glucose 70 - 99 mg/dL 114(H) 103(H) 111(H)  BUN 8 - 23 mg/dL 12 13 17   Creatinine 0.44 - 1.00 mg/dL 0.80 0.81 0.77  Sodium 135 - 145 mmol/L 141 141 139  Potassium 3.5 - 5.1 mmol/L 4.0 4.0 4.2  Chloride 98 - 111 mmol/L 107 105 105  CO2 22 - 32 mmol/L 26 26 27   Calcium 8.9 - 10.3 mg/dL 9.0 9.1 8.8(L)  Total Protein 6.5 - 8.1 g/dL 6.5 6.5 6.3(L)  Total Bilirubin 0.3 - 1.2 mg/dL 0.4 0.5 0.4  Alkaline Phos 38 - 126 U/L 55 54 58  AST 15 - 41 U/L 22 27 22   ALT 0 - 44 U/L 32 45(H) 36      RADIOGRAPHIC STUDIES: I have personally reviewed the radiological images as listed and agreed with the findings in the report. No results found.   ASSESSMENT & PLAN:  Cindy FASSNACHTis a 69y.o. female with    1.Malignant neoplasm of upper-inner quadrant of left breast, StageIIA,p(T2N0M0), ER/PR/HER2-,triple  negative,GradeIII -She was diagnosed in 02/2019. She underwent left breast lumpectomy with SLNB on 03/10/19. -Given her aggressive triple negative breast cancer, she was treated with adjuvant chemoAC q2weeks for 4 cycles. She is currently receiving Taxol q2weeks for 4 cycles since 06/09/19. Dose has been reduced starting 07/01/19 due to neutropenia.  -Her taxol was held last week due to neutropenia. Labs reviewed today, WBC 2.2 and ANC 1.1, Hg 10.5. Overall adequate to proceed with C6 Taxol today.  -I discussed in order to continue I recommend Granix injections to maintain her blood counts. Will give when indicated  such as this week. She is agreeable.  -Plan to complete by end of March. Then she will proceed with Adjuvant radiation.  -I will refer her to Dr. Brantley Stage to have PAC removed after radiation  -F/u in 2 weeks   2. Neutropenia  -Secondary to Taxol. She has required Taxol dose reduction to weekly and holding chemo for 1 week.  -I reviewed risk her high risk of infection and I again recommend restarting Granix injection, she agreed. Start with C6 as needed.  -Will continue to monitor.   3. Genetic Testing  -Her mother had colon cancer and father had bladder cancer, her MGF had cancer and her maternal great grandmother had breast cancer.  -I will check with Genetics to see if she is eligible for testing. She is interested ifinsurance covers it.  4. Anemia -Secondary to chemo. -I encouraged her to start multivitamin. -Improvedwith blood transfusion on 12/23 -Mild and stable on Taxol.   5. Acid reflux  -Does cause her to cough.  -ContinuePrilosec OTC.    PLAN: -Labs reviewed and adequate to proceed with C6 Taxol today, will increase dose back to 80 mg/m, she agrees to get Granix on 2/20 -Lab, flush, Taxol weekly X6 -f/u in 2, 4 and 6 weeks    No problem-specific Assessment & Plan notes found for this encounter.   No orders of the defined types were placed in  this encounter.  All questions were answered. The patient knows to call the clinic with any problems, questions or concerns. No barriers to learning was detected. The total time spent in the appointment was 30 minutes.     Truitt Merle, MD 07/29/2019   I, Joslyn Devon, am acting as scribe for Truitt Merle, MD.   I have reviewed the above documentation for accuracy and completeness, and I agree with the above.

## 2019-07-29 ENCOUNTER — Other Ambulatory Visit: Payer: PPO

## 2019-07-29 ENCOUNTER — Inpatient Hospital Stay: Payer: PPO

## 2019-07-29 ENCOUNTER — Inpatient Hospital Stay (HOSPITAL_BASED_OUTPATIENT_CLINIC_OR_DEPARTMENT_OTHER): Payer: PPO | Admitting: Hematology

## 2019-07-29 ENCOUNTER — Other Ambulatory Visit: Payer: Self-pay

## 2019-07-29 ENCOUNTER — Encounter: Payer: Self-pay | Admitting: Hematology

## 2019-07-29 ENCOUNTER — Ambulatory Visit: Payer: PPO

## 2019-07-29 VITALS — HR 83

## 2019-07-29 VITALS — BP 113/70 | HR 106 | Temp 98.4°F | Resp 18 | Ht 64.0 in | Wt 150.3 lb

## 2019-07-29 DIAGNOSIS — Z171 Estrogen receptor negative status [ER-]: Secondary | ICD-10-CM | POA: Diagnosis not present

## 2019-07-29 DIAGNOSIS — C50212 Malignant neoplasm of upper-inner quadrant of left female breast: Secondary | ICD-10-CM | POA: Diagnosis not present

## 2019-07-29 DIAGNOSIS — Z5111 Encounter for antineoplastic chemotherapy: Secondary | ICD-10-CM | POA: Diagnosis not present

## 2019-07-29 DIAGNOSIS — Z95828 Presence of other vascular implants and grafts: Secondary | ICD-10-CM

## 2019-07-29 LAB — CMP (CANCER CENTER ONLY)
ALT: 32 U/L (ref 0–44)
AST: 22 U/L (ref 15–41)
Albumin: 3.9 g/dL (ref 3.5–5.0)
Alkaline Phosphatase: 55 U/L (ref 38–126)
Anion gap: 8 (ref 5–15)
BUN: 12 mg/dL (ref 8–23)
CO2: 26 mmol/L (ref 22–32)
Calcium: 9 mg/dL (ref 8.9–10.3)
Chloride: 107 mmol/L (ref 98–111)
Creatinine: 0.8 mg/dL (ref 0.44–1.00)
GFR, Est AFR Am: >60 mL/min (ref >60)
GFR, Estimated: >60 mL/min (ref >60)
Glucose, Bld: 114 mg/dL — ABNORMAL HIGH (ref 70–99)
Potassium: 4 mmol/L (ref 3.5–5.1)
Sodium: 141 mmol/L (ref 135–145)
Total Bilirubin: 0.4 mg/dL (ref 0.3–1.2)
Total Protein: 6.5 g/dL (ref 6.5–8.1)

## 2019-07-29 LAB — CBC WITH DIFFERENTIAL (CANCER CENTER ONLY)
Abs Immature Granulocytes: 0.01 10*3/uL (ref 0.00–0.07)
Basophils Absolute: 0 10*3/uL (ref 0.0–0.1)
Basophils Relative: 1 %
Eosinophils Absolute: 0 10*3/uL (ref 0.0–0.5)
Eosinophils Relative: 1 %
HCT: 31.3 % — ABNORMAL LOW (ref 36.0–46.0)
Hemoglobin: 10.5 g/dL — ABNORMAL LOW (ref 12.0–15.0)
Immature Granulocytes: 1 %
Lymphocytes Relative: 30 %
Lymphs Abs: 0.7 10*3/uL (ref 0.7–4.0)
MCH: 32.5 pg (ref 26.0–34.0)
MCHC: 33.5 g/dL (ref 30.0–36.0)
MCV: 96.9 fL (ref 80.0–100.0)
Monocytes Absolute: 0.4 10*3/uL (ref 0.1–1.0)
Monocytes Relative: 19 %
Neutro Abs: 1.1 10*3/uL — ABNORMAL LOW (ref 1.7–7.7)
Neutrophils Relative %: 48 %
Platelet Count: 243 10*3/uL (ref 150–400)
RBC: 3.23 MIL/uL — ABNORMAL LOW (ref 3.87–5.11)
RDW: 14.9 % (ref 11.5–15.5)
WBC Count: 2.2 10*3/uL — ABNORMAL LOW (ref 4.0–10.5)
nRBC: 0 % (ref 0.0–0.2)

## 2019-07-29 MED ORDER — SODIUM CHLORIDE 0.9 % IV SOLN
Freq: Once | INTRAVENOUS | Status: AC
  Filled 2019-07-29: qty 250

## 2019-07-29 MED ORDER — FAMOTIDINE IN NACL 20-0.9 MG/50ML-% IV SOLN
20.0000 mg | Freq: Once | INTRAVENOUS | Status: AC
  Administered 2019-07-29: 20 mg via INTRAVENOUS

## 2019-07-29 MED ORDER — LORATADINE 10 MG PO TABS
ORAL_TABLET | ORAL | Status: AC
  Filled 2019-07-29: qty 1

## 2019-07-29 MED ORDER — HEPARIN SOD (PORK) LOCK FLUSH 100 UNIT/ML IV SOLN
500.0000 [IU] | Freq: Once | INTRAVENOUS | Status: AC | PRN
  Administered 2019-07-29: 500 [IU]
  Filled 2019-07-29: qty 5

## 2019-07-29 MED ORDER — SODIUM CHLORIDE 0.9% FLUSH
10.0000 mL | INTRAVENOUS | Status: DC | PRN
  Administered 2019-07-29: 10 mL
  Filled 2019-07-29: qty 10

## 2019-07-29 MED ORDER — FAMOTIDINE IN NACL 20-0.9 MG/50ML-% IV SOLN
INTRAVENOUS | Status: AC
  Filled 2019-07-29: qty 50

## 2019-07-29 MED ORDER — SODIUM CHLORIDE 0.9 % IV SOLN
80.0000 mg/m2 | Freq: Once | INTRAVENOUS | Status: AC
  Administered 2019-07-29: 14:00:00 144 mg via INTRAVENOUS
  Filled 2019-07-29: qty 24

## 2019-07-29 MED ORDER — LORATADINE 10 MG PO TABS
10.0000 mg | ORAL_TABLET | Freq: Every day | ORAL | Status: DC
  Administered 2019-07-29: 10 mg via ORAL

## 2019-07-29 MED ORDER — SODIUM CHLORIDE 0.9 % IV SOLN
20.0000 mg | Freq: Once | INTRAVENOUS | Status: AC
  Administered 2019-07-29: 20 mg via INTRAVENOUS
  Filled 2019-07-29: qty 20

## 2019-07-29 MED ORDER — SODIUM CHLORIDE 0.9% FLUSH
10.0000 mL | Freq: Once | INTRAVENOUS | Status: AC
  Administered 2019-07-29: 11:00:00 10 mL
  Filled 2019-07-29: qty 10

## 2019-07-29 NOTE — Patient Instructions (Signed)

## 2019-07-29 NOTE — Progress Notes (Signed)
Okay to treat with ANC 1.1 per Dr. Burr Medico. Will get granix injection.

## 2019-07-29 NOTE — Patient Instructions (Signed)
[facility] Discharge Instructions for Patients Receiving Chemotherapy  Today you received the following chemotherapy agents:  Taxol.  To help prevent nausea and vomiting after your treatment, we encourage you to take your nausea medication as directed.   If you develop nausea and vomiting that is not controlled by your nausea medication, call the clinic.   BELOW ARE SYMPTOMS THAT SHOULD BE REPORTED IMMEDIATELY:  *FEVER GREATER THAN 100.5 F  *CHILLS WITH OR WITHOUT FEVER  NAUSEA AND VOMITING THAT IS NOT CONTROLLED WITH YOUR NAUSEA MEDICATION  *UNUSUAL SHORTNESS OF BREATH  *UNUSUAL BRUISING OR BLEEDING  TENDERNESS IN MOUTH AND THROAT WITH OR WITHOUT PRESENCE OF ULCERS  *URINARY PROBLEMS  *BOWEL PROBLEMS  UNUSUAL RASH Items with * indicate a potential emergency and should be followed up as soon as possible.  Feel free to call the clinic should you have any questions or concerns. The clinic phone number is [phone].  Please show the CHEMO ALERT CARD at check-in to the Emergency Department and triage nurse.   

## 2019-07-30 ENCOUNTER — Telehealth: Payer: Self-pay | Admitting: Hematology

## 2019-07-30 NOTE — Telephone Encounter (Signed)
Scheduled appt per 2/17 los.  Spoke with pt and she is aware of the appt dates and time.  Per the patient the MD to see if the patient could not have a f/u on 3/31 due to the appt time that was available, which was a late afternoon.  Per Burr Medico it was okay to go ahead and cancel the MD visit that was scheduled for 3/31.

## 2019-08-01 ENCOUNTER — Other Ambulatory Visit: Payer: Self-pay

## 2019-08-01 ENCOUNTER — Inpatient Hospital Stay: Payer: PPO

## 2019-08-01 VITALS — BP 135/77 | HR 113 | Temp 97.8°F

## 2019-08-01 DIAGNOSIS — Z171 Estrogen receptor negative status [ER-]: Secondary | ICD-10-CM

## 2019-08-01 DIAGNOSIS — Z5111 Encounter for antineoplastic chemotherapy: Secondary | ICD-10-CM | POA: Diagnosis not present

## 2019-08-01 DIAGNOSIS — C50212 Malignant neoplasm of upper-inner quadrant of left female breast: Secondary | ICD-10-CM

## 2019-08-01 DIAGNOSIS — Z95828 Presence of other vascular implants and grafts: Secondary | ICD-10-CM

## 2019-08-01 MED ORDER — FILGRASTIM-AAFI 300 MCG/0.5ML IJ SOSY
300.0000 ug | PREFILLED_SYRINGE | Freq: Once | INTRAMUSCULAR | Status: AC
  Administered 2019-08-01: 12:00:00 300 ug via SUBCUTANEOUS
  Filled 2019-08-01: qty 0.5

## 2019-08-01 NOTE — Patient Instructions (Signed)

## 2019-08-04 ENCOUNTER — Encounter: Payer: Self-pay | Admitting: Family Medicine

## 2019-08-04 MED ORDER — FLUOXETINE HCL 40 MG PO CAPS: 40.0000 mg | ORAL_CAPSULE | Freq: Every day | ORAL | 2 refills | Status: DC

## 2019-08-05 ENCOUNTER — Inpatient Hospital Stay: Payer: PPO

## 2019-08-05 ENCOUNTER — Other Ambulatory Visit: Payer: Self-pay

## 2019-08-05 VITALS — BP 117/69 | HR 98 | Temp 98.3°F

## 2019-08-05 DIAGNOSIS — Z95828 Presence of other vascular implants and grafts: Secondary | ICD-10-CM

## 2019-08-05 DIAGNOSIS — C50212 Malignant neoplasm of upper-inner quadrant of left female breast: Secondary | ICD-10-CM

## 2019-08-05 DIAGNOSIS — Z5111 Encounter for antineoplastic chemotherapy: Secondary | ICD-10-CM | POA: Diagnosis not present

## 2019-08-05 DIAGNOSIS — Z171 Estrogen receptor negative status [ER-]: Secondary | ICD-10-CM

## 2019-08-05 LAB — CBC WITH DIFFERENTIAL (CANCER CENTER ONLY)
Abs Immature Granulocytes: 0.02 10*3/uL (ref 0.00–0.07)
Basophils Absolute: 0 10*3/uL (ref 0.0–0.1)
Basophils Relative: 0 %
Eosinophils Absolute: 0.1 10*3/uL (ref 0.0–0.5)
Eosinophils Relative: 2 %
HCT: 31.5 % — ABNORMAL LOW (ref 36.0–46.0)
Hemoglobin: 10.4 g/dL — ABNORMAL LOW (ref 12.0–15.0)
Immature Granulocytes: 1 %
Lymphocytes Relative: 29 %
Lymphs Abs: 0.9 10*3/uL (ref 0.7–4.0)
MCH: 33.1 pg (ref 26.0–34.0)
MCHC: 33 g/dL (ref 30.0–36.0)
MCV: 100.3 fL — ABNORMAL HIGH (ref 80.0–100.0)
Monocytes Absolute: 0.5 10*3/uL (ref 0.1–1.0)
Monocytes Relative: 15 %
Neutro Abs: 1.6 10*3/uL — ABNORMAL LOW (ref 1.7–7.7)
Neutrophils Relative %: 53 %
Platelet Count: 200 10*3/uL (ref 150–400)
RBC: 3.14 MIL/uL — ABNORMAL LOW (ref 3.87–5.11)
RDW: 14.5 % (ref 11.5–15.5)
WBC Count: 3 10*3/uL — ABNORMAL LOW (ref 4.0–10.5)
nRBC: 0 % (ref 0.0–0.2)

## 2019-08-05 LAB — CMP (CANCER CENTER ONLY)
ALT: 30 U/L (ref 0–44)
AST: 18 U/L (ref 15–41)
Albumin: 3.7 g/dL (ref 3.5–5.0)
Alkaline Phosphatase: 59 U/L (ref 38–126)
Anion gap: 9 (ref 5–15)
BUN: 13 mg/dL (ref 8–23)
CO2: 28 mmol/L (ref 22–32)
Calcium: 9.3 mg/dL (ref 8.9–10.3)
Chloride: 105 mmol/L (ref 98–111)
Creatinine: 0.76 mg/dL (ref 0.44–1.00)
GFR, Est AFR Am: >60 mL/min (ref >60)
GFR, Estimated: >60 mL/min (ref >60)
Glucose, Bld: 118 mg/dL — ABNORMAL HIGH (ref 70–99)
Potassium: 3.9 mmol/L (ref 3.5–5.1)
Sodium: 142 mmol/L (ref 135–145)
Total Bilirubin: 0.3 mg/dL (ref 0.3–1.2)
Total Protein: 6.3 g/dL — ABNORMAL LOW (ref 6.5–8.1)

## 2019-08-05 MED ORDER — SODIUM CHLORIDE 0.9 % IV SOLN
Freq: Once | INTRAVENOUS | Status: AC
  Filled 2019-08-05: qty 250

## 2019-08-05 MED ORDER — SODIUM CHLORIDE 0.9% FLUSH
10.0000 mL | Freq: Once | INTRAVENOUS | Status: AC
  Administered 2019-08-05: 10 mL
  Filled 2019-08-05: qty 10

## 2019-08-05 MED ORDER — HEPARIN SOD (PORK) LOCK FLUSH 100 UNIT/ML IV SOLN
500.0000 [IU] | Freq: Once | INTRAVENOUS | Status: AC | PRN
  Administered 2019-08-05: 500 [IU]
  Filled 2019-08-05: qty 5

## 2019-08-05 MED ORDER — LORATADINE 10 MG PO TABS
10.0000 mg | ORAL_TABLET | Freq: Every day | ORAL | Status: DC
  Administered 2019-08-05: 10 mg via ORAL

## 2019-08-05 MED ORDER — SODIUM CHLORIDE 0.9% FLUSH
10.0000 mL | INTRAVENOUS | Status: DC | PRN
  Administered 2019-08-05: 10 mL
  Filled 2019-08-05: qty 10

## 2019-08-05 MED ORDER — SODIUM CHLORIDE 0.9 % IV SOLN
80.0000 mg/m2 | Freq: Once | INTRAVENOUS | Status: AC
  Administered 2019-08-05: 144 mg via INTRAVENOUS
  Filled 2019-08-05: qty 24

## 2019-08-05 MED ORDER — FAMOTIDINE IN NACL 20-0.9 MG/50ML-% IV SOLN
20.0000 mg | Freq: Once | INTRAVENOUS | Status: AC
  Administered 2019-08-05: 20 mg via INTRAVENOUS

## 2019-08-05 MED ORDER — SODIUM CHLORIDE 0.9 % IV SOLN
20.0000 mg | Freq: Once | INTRAVENOUS | Status: AC
  Administered 2019-08-05: 20 mg via INTRAVENOUS
  Filled 2019-08-05: qty 20

## 2019-08-05 MED ORDER — FAMOTIDINE IN NACL 20-0.9 MG/50ML-% IV SOLN
INTRAVENOUS | Status: AC
  Filled 2019-08-05: qty 50

## 2019-08-05 MED ORDER — LORATADINE 10 MG PO TABS
ORAL_TABLET | ORAL | Status: AC
  Filled 2019-08-05: qty 1

## 2019-08-05 NOTE — Patient Instructions (Signed)
[facility] Discharge Instructions for Patients Receiving Chemotherapy  Today you received the following chemotherapy agents:  Taxol.  To help prevent nausea and vomiting after your treatment, we encourage you to take your nausea medication as directed.   If you develop nausea and vomiting that is not controlled by your nausea medication, call the clinic.   BELOW ARE SYMPTOMS THAT SHOULD BE REPORTED IMMEDIATELY:  *FEVER GREATER THAN 100.5 F  *CHILLS WITH OR WITHOUT FEVER  NAUSEA AND VOMITING THAT IS NOT CONTROLLED WITH YOUR NAUSEA MEDICATION  *UNUSUAL SHORTNESS OF BREATH  *UNUSUAL BRUISING OR BLEEDING  TENDERNESS IN MOUTH AND THROAT WITH OR WITHOUT PRESENCE OF ULCERS  *URINARY PROBLEMS  *BOWEL PROBLEMS  UNUSUAL RASH Items with * indicate a potential emergency and should be followed up as soon as possible.  Feel free to call the clinic should you have any questions or concerns. The clinic phone number is [phone].  Please show the CHEMO ALERT CARD at check-in to the Emergency Department and triage nurse.   

## 2019-08-05 NOTE — Progress Notes (Signed)
Spoke w/ Dr. Burr Medico, no Faylene Kurtz this week. Plan to give between paclitaxel doses if ANC < 1.3.   Demetrius Charity, PharmD, [facility] Pharmacist Pharmacy Phone: [phone] 08/05/2019

## 2019-08-06 NOTE — Progress Notes (Signed)
Cindy [name]   [phone] [phone]   Clinic Follow up Note   Patient Care Team: Mosie Lukes, MD as PCP - General (Family Medicine) Cherylann Banas, Cherly Anderson, MD (Inactive) (Obstetrics and Gynecology) Rockwell Germany, RN as Oncology Nurse Navigator Mauro Kaufmann, RN as Oncology Nurse Navigator Erroll Luna, MD as Consulting Physician (General Surgery) Truitt Merle, MD as Consulting Physician (Hematology) Gery Pray, MD as Consulting Physician (Radiation Oncology)  Date of Service:  08/12/2019  CHIEF COMPLAINT: F/u of left breast cancer  SUMMARY OF ONCOLOGIC HISTORY: Oncology History Overview Note  Cancer Staging Malignant neoplasm of upper-inner quadrant of left breast in female, estrogen receptor negative ([place]) Staging form: Breast, AJCC 8th Edition - Clinical stage from 02/06/2019: Stage IB (cT1c, cN0, cM0, G2, ER-, PR-, HER2-) - Signed by Truitt Merle, MD on 02/17/2019 - Pathologic stage from 03/10/2019: Stage IIA (pT2, pN0, cM0, G3, ER-, PR-, HER2-) - Signed by Truitt Merle, MD on 03/12/2019    Malignant neoplasm of upper-inner quadrant of left breast in female, estrogen receptor negative ([place])  02/04/2019 Mammogram   Diagnostic mammogram 02/04/19  IMPRESSION: Highly suspicious 1.7 x1.5 x1.7cm at 10:00 position of UPPER INNER LEFT breast mass 10 cm from nipple corresponding to the screening study finding. Tissue sampling recommended.   No abnormal LEFT axillary lymph nodes.   02/06/2019 Cancer Staging   Staging form: Breast, AJCC 8th Edition - Clinical stage from 02/06/2019: Stage IB (cT1c, cN0, cM0, G2, ER-, PR-, HER2-) - Signed by Truitt Merle, MD on 02/17/2019   02/06/2019 Initial Biopsy   Diagnosis 02/06/19 Breast, left, needle core biopsy, 10 o'clock, 10cm/n, ribbon clip - INVASIVE MAMMARY CARCINOMA.   02/06/2019 Receptors her2   Results: IMMUNOHISTOCHEMICAL AND MORPHOMETRIC ANALYSIS PERFORMED MANUALLY The tumor cells are NEGATIVE for Her2  (1+). Estrogen Receptor: 0%, NEGATIVE Progesterone Receptor: 0%, NEGATIVE Proliferation Marker Ki67: 40%   02/17/2019 Initial Diagnosis   Malignant neoplasm of upper-inner quadrant of left breast in female, estrogen receptor negative ([place])   03/10/2019 Cancer Staging   Staging form: Breast, AJCC 8th Edition - Pathologic stage from 03/10/2019: Stage IIA (pT2, pN0, cM0, G3, ER-, PR-, HER2-) - Signed by Truitt Merle, MD on 03/12/2019   03/10/2019 Surgery   LEFT BREAST LUMPECTOMY WITH RADIOACTIVE SEED AND LEFT SENTINEL LYMPH NODE MAPPING and PAC placement  by Dr. Brantley Stage  03/10/19   03/10/2019 Pathology Results   DIAGNOSIS: 03/10/19  A. BREAST, LEFT, LUMPECTOMY:  - Invasive ductal carcinoma, grade 3, 2.5 cm  - Carcinoma is 0.3 cm from superior margin  - Medial resection edge is 0.1 cm from carcinoma (final medial margin is  represented by part 2 which is negative for carcinoma)  - Negative for lymphovascular or perineural invasion  - Biopsy site changes  - See oncology table   B. BREAST, LEFT MEDIAL MARGIN, EXCISION:  - Benign breast parenchyma, negative for carcinoma   C. LYMPH NODE, LEFT AXILLARY, SENTINEL BIOPSY:  - Lymph node, negative for carcinoma (0/1)    04/06/2019 Imaging   Bone scan IMPRESSION: Mild focus of uptake is seen involving the anterior portion of left upper rib, but there is no corresponding abnormality seen on the CT scan of the same day. Potentially this may represent degenerative change or secondary to overlying postsurgical change. Attention on follow-up is recommended.   No definite scintigraphic evidence of osseous metastases.   04/06/2019 Imaging   CT CAP IMPRESSION: 1. Surgical changes left breast with borderline enlarged left axillary lymph node. No  other definite evidence for metastatic disease in the chest, abdomen, or pelvis. 2. Tiny bilateral pulmonary nodules. Attention on follow-up recommended. 3. Scattered small hypoattenuating lesions in the  liver parenchyma. Some of these have attenuation higher than would be expected for simple fluid and may be cysts complicated by proteinaceous debris or hemorrhage. MRI without and with contrast recommended to further evaluate. 4. Small to moderate hiatal hernia. 5. Aortic Atherosclerosis (ICD10-I70.0).   04/08/2019 -  Chemotherapy   chemo AC q2weeks for 4 cycles starting 04/08/19-05/20/19 followed by Taxol weekly for 12 weeks starting 06/09/19.        CURRENT THERAPY:  chemo AC q2weeks for 4 cycles starting 04/08/19-12/9/20followed by Taxolweekly starting 06/09/19 for 12 weeks.added Granix with Taxol C6.   INTERVAL HISTORY:  Cindy [name] is here for a follow up and treatment. She presents to the clinic alone. She notes around day 2-3 she will feel down for a few days and then will get better. She notes with last week's cycle she was fatigued the whole week and not very active. She notes she is only active with walk from our parking low. She denies any pain or diarrhea. She notes any nausea she had is from hiatal hernia. She is not taking medication outside of tums. She has been able to maintain weight with fair eating. She noticed her pulse rate has been elevated in clinic lately. She denies palpitations but has very slight SOB. She has not checked her pulse at home.     REVIEW OF SYSTEMS:   Constitutional: Denies fevers, chills or abnormal weight loss Eyes: Denies blurriness of vision Ears, nose, mouth, throat, and face: Denies mucositis or sore throat Respiratory: Denies cough, dyspnea or wheezes Cardiovascular: Denies palpitation, chest discomfort or lower extremity swelling Gastrointestinal:  Denies heartburn or change in bowel habits (+) Hiatal hernia with nausea  Skin: Denies abnormal skin rashes Lymphatics: Denies new lymphadenopathy or easy bruising Neurological:Denies numbness, tingling or new weaknesses Behavioral/Psych: Mood is stable, no new changes  All other  systems were reviewed with the patient and are negative.  MEDICAL HISTORY:  Past Medical History:  Diagnosis Date  . Allergy   . Depression    ? if bipolar works the best  . Elevated fasting blood sugar    hx  . H/O measles   . Headache   . History of chicken pox   . Hx of migraines   . Microscopic hematuria    advised to see urology ? never went  . Preventative health care 06/20/2011    SURGICAL HISTORY: Past Surgical History:  Procedure Laterality Date  . BREAST LUMPECTOMY WITH RADIOACTIVE SEED AND SENTINEL LYMPH NODE BIOPSY Left 03/10/2019   Procedure: LEFT BREAST LUMPECTOMY WITH RADIOACTIVE SEED AND LEFT SENTINEL LYMPH NODE MAPPING;  Surgeon: Erroll Luna, MD;  Location: [facility];  Service: General;  Laterality: Left;  . endometrial polyps     2 surgeries by Dr Cathlean Cower  . EYE SURGERY  04/29/2009   Rt eye tighten up  . PORTACATH PLACEMENT N/A 03/10/2019   Procedure: INSERTION PORT-A-CATH WITH ULTRASOUND;  Surgeon: Erroll Luna, MD;  Location: [facility];  Service: General;  Laterality: N/A;  . steel plate in skull Left    from a crushed skull    I have reviewed the social history and family history with the patient and they are unchanged from previous note.  ALLERGIES:  is allergic to bromfed and venlafaxine.  MEDICATIONS:  Current Outpatient Medications  Medication Sig Dispense Refill  .  FLUoxetine (PROZAC) 40 MG capsule Take 1 capsule (40 mg total) by mouth daily. 90 capsule 2  . lidocaine-prilocaine (EMLA) cream Apply to affected area once 30 g 3  . ondansetron (ZOFRAN) 8 MG tablet Take 1 tablet (8 mg total) by mouth 2 (two) times daily as needed. Start on the third day after chemotherapy. 30 tablet 2  . prochlorperazine (COMPAZINE) 10 MG tablet Take 1 tablet (10 mg total) by mouth every 6 (six) hours as needed (Nausea or vomiting). 30 tablet 2   No current facility-administered medications for this visit.    PHYSICAL EXAMINATION: ECOG PERFORMANCE STATUS: 3 -  Symptomatic, >50% confined to bed  Vitals:   08/12/19 1154  BP: 124/72  Pulse: (!) 126  Resp: 17  Temp: (!) 97.5 F (36.4 C)  SpO2: 98%   Filed Weights   08/12/19 1154  Weight: 148 lb 12.8 oz (67.5 kg)    GENERAL:alert, no distress and comfortable SKIN: skin color, texture, turgor are normal, no rashes or significant lesions EYES: normal, Conjunctiva are pink and non-injected, sclera clear  NECK: supple, thyroid normal size, non-tender, without nodularity LYMPH:  no palpable lymphadenopathy in the cervical, axillary  LUNGS: clear to auscultation and percussion with normal breathing effort HEART: regular rhythm and no murmurs and no lower extremity edema  ABDOMEN:abdomen soft, non-tender and normal bowel sounds Musculoskeletal:no cyanosis of digits and no clubbing  NEURO: alert & oriented x 3 with fluent speech, no focal motor/sensory deficits  LABORATORY DATA:  I have reviewed the data as listed CBC Latest Ref Rng & Units 08/05/2019 07/29/2019 07/15/2019  WBC 4.0 - 10.5 K/uL 3.0(L) 2.2(L) 1.7(L)  Hemoglobin 12.0 - 15.0 g/dL 10.4(L) 10.5(L) 10.2(L)  Hematocrit 36.0 - 46.0 % 31.5(L) 31.3(L) 30.1(L)  Platelets 150 - 400 K/uL 200 243 218     CMP Latest Ref Rng & Units 08/05/2019 07/29/2019 07/15/2019  Glucose 70 - 99 mg/dL 118(H) 114(H) 103(H)  BUN 8 - 23 mg/dL 13 12 13   Creatinine 0.44 - 1.00 mg/dL 0.76 0.80 0.81  Sodium 135 - 145 mmol/L 142 141 141  Potassium 3.5 - 5.1 mmol/L 3.9 4.0 4.0  Chloride 98 - 111 mmol/L 105 107 105  CO2 22 - 32 mmol/L 28 26 26   Calcium 8.9 - 10.3 mg/dL 9.3 9.0 9.1  Total Protein 6.5 - 8.1 g/dL 6.3(L) 6.5 6.5  Total Bilirubin 0.3 - 1.2 mg/dL 0.3 0.4 0.5  Alkaline Phos 38 - 126 U/L 59 55 54  AST 15 - 41 U/L 18 22 27   ALT 0 - 44 U/L 30 32 45(H)      RADIOGRAPHIC STUDIES: I have personally reviewed the radiological images as listed and agreed with the findings in the report. No results found.   ASSESSMENT & PLAN:  Cindy [name] is a 69 y.o.  female with    1.Malignant neoplasm of upper-inner quadrant of left breast, StageIIA,p(T2N0M0), ER/PR/HER2-,triple negative,GradeIII -She was diagnosed in 02/2019. She underwent left breast lumpectomy with SLNB on 03/10/19. -Given her aggressive triple negative breast cancer,she was treated with adjuvantchemoAC q2weeks for 4 cycles. She is currently receivingTaxolweekly for 12 weeks since 06/09/19. Dose for C3-5 due to neutropenia. Granix was added with C6.  -S/p C7 Taxol she had more persistent fatigue that lasted longer with little to no activity. She has been having recent mildly elevated heart rate. I recommend she take a week break of Taxol to give her more time for her body to recover. We may stop chemo before 12 cycles  if needed.  -I recommend she drink more water, eat well with high protein diet, remain active. Will restart chemo next week with dose reduction to 75m/m2.  -Plan to complete by end of March. Then she will proceed with Adjuvant radiation. I will refer her to Dr. CBrantley Stageto have PAC removed after radiation  -F/u in 2 weeks   2. Neutropenia  -Secondary to Taxol. She has required Taxol dose reduction to weekly and holding chemo for 1 week.  -I reviewed risk her high risk of infection and I again recommend restarting Granix injection, she agreed. Start with C6 as needed.  -Will continue to monitor.Has recently improved.   3. Genetic Testing  -Her mother had colon cancer and father had bladder cancer, her MGF had cancer and her maternal great grandmother had breast cancer.  -I will check with Genetics to see if she is eligible for testing. She is interested ifinsurance covers it.  4.Anemia -Secondary to chemo. -I encouraged her to start multivitamin. -Improvedwith blood transfusion on 12/23 -Mild and stable on Taxol.   5. Acid reflux, Hiatal hernia   -Does cause her to cough.  -ContinuePrilosec OTC.  -She also has hiatal hernia which leads to mild  cough and nausea. Partly managed with tums.   6. Mild Tachycardia  -She has been having mildly elevated heart rate in clinic lately. She denies palpitations or chest pain but notes very slight shortness of breath -I encouraged her to monitor her heart rate at home.    PLAN: -Will hold chemo this week due to severe fatigue  -Lab, flush, Taxol in 1, 2, 3, 4 weeks with reduce dose to 737mm2 -f/u 2 and 4 weeks    No problem-specific Assessment & Plan notes found for this encounter.   No orders of the defined types were placed in this encounter.  All questions were answered. The patient knows to call the clinic with any problems, questions or concerns. No barriers to learning was detected. The total time spent in the appointment was 30 minutes.     YaTruitt MerleMD 08/12/2019   I, AmJoslyn Devonam acting as scribe for YaTruitt MerleMD.   I have reviewed the above documentation for accuracy and completeness, and I agree with the above.

## 2019-08-11 ENCOUNTER — Encounter: Payer: Self-pay | Admitting: *Deleted

## 2019-08-12 ENCOUNTER — Inpatient Hospital Stay: Payer: PPO

## 2019-08-12 ENCOUNTER — Other Ambulatory Visit: Payer: PPO

## 2019-08-12 ENCOUNTER — Encounter: Payer: Self-pay | Admitting: Hematology

## 2019-08-12 ENCOUNTER — Other Ambulatory Visit: Payer: Self-pay

## 2019-08-12 ENCOUNTER — Ambulatory Visit: Payer: PPO | Admitting: Nurse Practitioner

## 2019-08-12 ENCOUNTER — Inpatient Hospital Stay: Payer: PPO | Attending: Hematology | Admitting: Hematology

## 2019-08-12 VITALS — BP 124/72 | HR 126 | Temp 97.5°F | Resp 17 | Ht 64.0 in | Wt 148.8 lb

## 2019-08-12 DIAGNOSIS — Z5111 Encounter for antineoplastic chemotherapy: Secondary | ICD-10-CM | POA: Diagnosis not present

## 2019-08-12 DIAGNOSIS — F329 Major depressive disorder, single episode, unspecified: Secondary | ICD-10-CM | POA: Insufficient documentation

## 2019-08-12 DIAGNOSIS — D701 Agranulocytosis secondary to cancer chemotherapy: Secondary | ICD-10-CM | POA: Insufficient documentation

## 2019-08-12 DIAGNOSIS — D6481 Anemia due to antineoplastic chemotherapy: Secondary | ICD-10-CM | POA: Diagnosis not present

## 2019-08-12 DIAGNOSIS — Z8052 Family history of malignant neoplasm of bladder: Secondary | ICD-10-CM | POA: Diagnosis not present

## 2019-08-12 DIAGNOSIS — C50212 Malignant neoplasm of upper-inner quadrant of left female breast: Secondary | ICD-10-CM | POA: Diagnosis not present

## 2019-08-12 DIAGNOSIS — Z803 Family history of malignant neoplasm of breast: Secondary | ICD-10-CM | POA: Insufficient documentation

## 2019-08-12 DIAGNOSIS — T451X5A Adverse effect of antineoplastic and immunosuppressive drugs, initial encounter: Secondary | ICD-10-CM | POA: Diagnosis not present

## 2019-08-12 DIAGNOSIS — K219 Gastro-esophageal reflux disease without esophagitis: Secondary | ICD-10-CM | POA: Diagnosis not present

## 2019-08-12 DIAGNOSIS — K449 Diaphragmatic hernia without obstruction or gangrene: Secondary | ICD-10-CM | POA: Insufficient documentation

## 2019-08-12 DIAGNOSIS — Z800 Family history of malignant neoplasm of digestive organs: Secondary | ICD-10-CM | POA: Diagnosis not present

## 2019-08-12 DIAGNOSIS — R918 Other nonspecific abnormal finding of lung field: Secondary | ICD-10-CM | POA: Insufficient documentation

## 2019-08-12 DIAGNOSIS — Z171 Estrogen receptor negative status [ER-]: Secondary | ICD-10-CM

## 2019-08-12 DIAGNOSIS — Z79899 Other long term (current) drug therapy: Secondary | ICD-10-CM | POA: Diagnosis not present

## 2019-08-13 ENCOUNTER — Telehealth: Payer: Self-pay | Admitting: Hematology

## 2019-08-13 NOTE — Telephone Encounter (Signed)
No los per 3/3. 

## 2019-08-19 ENCOUNTER — Inpatient Hospital Stay: Payer: PPO

## 2019-08-19 ENCOUNTER — Other Ambulatory Visit: Payer: Self-pay

## 2019-08-19 VITALS — BP 126/70 | HR 92 | Temp 98.7°F | Resp 17 | Wt 149.5 lb

## 2019-08-19 DIAGNOSIS — C50212 Malignant neoplasm of upper-inner quadrant of left female breast: Secondary | ICD-10-CM

## 2019-08-19 DIAGNOSIS — Z171 Estrogen receptor negative status [ER-]: Secondary | ICD-10-CM

## 2019-08-19 DIAGNOSIS — Z5111 Encounter for antineoplastic chemotherapy: Secondary | ICD-10-CM | POA: Diagnosis not present

## 2019-08-19 DIAGNOSIS — Z95828 Presence of other vascular implants and grafts: Secondary | ICD-10-CM

## 2019-08-19 LAB — CMP (CANCER CENTER ONLY)
ALT: 20 U/L (ref 0–44)
AST: 19 U/L (ref 15–41)
Albumin: 3.8 g/dL (ref 3.5–5.0)
Alkaline Phosphatase: 54 U/L (ref 38–126)
Anion gap: 6 (ref 5–15)
BUN: 13 mg/dL (ref 8–23)
CO2: 27 mmol/L (ref 22–32)
Calcium: 9.1 mg/dL (ref 8.9–10.3)
Chloride: 107 mmol/L (ref 98–111)
Creatinine: 0.77 mg/dL (ref 0.44–1.00)
GFR, Est AFR Am: >60 mL/min (ref >60)
GFR, Estimated: >60 mL/min (ref >60)
Glucose, Bld: 102 mg/dL — ABNORMAL HIGH (ref 70–99)
Potassium: 4.1 mmol/L (ref 3.5–5.1)
Sodium: 140 mmol/L (ref 135–145)
Total Bilirubin: 0.3 mg/dL (ref 0.3–1.2)
Total Protein: 6.3 g/dL — ABNORMAL LOW (ref 6.5–8.1)

## 2019-08-19 LAB — CBC WITH DIFFERENTIAL (CANCER CENTER ONLY)
Abs Immature Granulocytes: 0 10*3/uL (ref 0.00–0.07)
Basophils Absolute: 0 10*3/uL (ref 0.0–0.1)
Basophils Relative: 1 %
Eosinophils Absolute: 0 10*3/uL (ref 0.0–0.5)
Eosinophils Relative: 1 %
HCT: 32.3 % — ABNORMAL LOW (ref 36.0–46.0)
Hemoglobin: 10.7 g/dL — ABNORMAL LOW (ref 12.0–15.0)
Immature Granulocytes: 0 %
Lymphocytes Relative: 35 %
Lymphs Abs: 1 10*3/uL (ref 0.7–4.0)
MCH: 32.8 pg (ref 26.0–34.0)
MCHC: 33.1 g/dL (ref 30.0–36.0)
MCV: 99.1 fL (ref 80.0–100.0)
Monocytes Absolute: 0.5 10*3/uL (ref 0.1–1.0)
Monocytes Relative: 18 %
Neutro Abs: 1.3 10*3/uL — ABNORMAL LOW (ref 1.7–7.7)
Neutrophils Relative %: 45 %
Platelet Count: 265 10*3/uL (ref 150–400)
RBC: 3.26 MIL/uL — ABNORMAL LOW (ref 3.87–5.11)
RDW: 14.1 % (ref 11.5–15.5)
WBC Count: 2.9 10*3/uL — ABNORMAL LOW (ref 4.0–10.5)
nRBC: 0 % (ref 0.0–0.2)

## 2019-08-19 MED ORDER — SODIUM CHLORIDE 0.9% FLUSH
10.0000 mL | INTRAVENOUS | Status: DC | PRN
  Administered 2019-08-19: 10 mL
  Filled 2019-08-19: qty 10

## 2019-08-19 MED ORDER — FAMOTIDINE IN NACL 20-0.9 MG/50ML-% IV SOLN
INTRAVENOUS | Status: AC
  Filled 2019-08-19: qty 50

## 2019-08-19 MED ORDER — SODIUM CHLORIDE 0.9 % IV SOLN
70.0000 mg/m2 | Freq: Once | INTRAVENOUS | Status: AC
  Administered 2019-08-19: 126 mg via INTRAVENOUS
  Filled 2019-08-19: qty 21

## 2019-08-19 MED ORDER — SODIUM CHLORIDE 0.9 % IV SOLN
Freq: Once | INTRAVENOUS | Status: AC
  Filled 2019-08-19: qty 250

## 2019-08-19 MED ORDER — HEPARIN SOD (PORK) LOCK FLUSH 100 UNIT/ML IV SOLN
500.0000 [IU] | Freq: Once | INTRAVENOUS | Status: AC | PRN
  Administered 2019-08-19: 500 [IU]
  Filled 2019-08-19: qty 5

## 2019-08-19 MED ORDER — SODIUM CHLORIDE 0.9% FLUSH
10.0000 mL | Freq: Once | INTRAVENOUS | Status: AC
  Administered 2019-08-19: 10 mL
  Filled 2019-08-19: qty 10

## 2019-08-19 MED ORDER — LORATADINE 10 MG PO TABS
ORAL_TABLET | ORAL | Status: AC
  Filled 2019-08-19: qty 1

## 2019-08-19 MED ORDER — FAMOTIDINE IN NACL 20-0.9 MG/50ML-% IV SOLN
20.0000 mg | Freq: Once | INTRAVENOUS | Status: AC
  Administered 2019-08-19: 20 mg via INTRAVENOUS

## 2019-08-19 MED ORDER — SODIUM CHLORIDE 0.9 % IV SOLN
20.0000 mg | Freq: Once | INTRAVENOUS | Status: AC
  Administered 2019-08-19: 20 mg via INTRAVENOUS
  Filled 2019-08-19: qty 20

## 2019-08-19 MED ORDER — LORATADINE 10 MG PO TABS
10.0000 mg | ORAL_TABLET | Freq: Every day | ORAL | Status: DC
  Administered 2019-08-19: 10 mg via ORAL

## 2019-08-19 NOTE — Patient Instructions (Signed)
[facility] Discharge Instructions for Patients Receiving Chemotherapy  Today you received the following chemotherapy agents:  Taxol.  To help prevent nausea and vomiting after your treatment, we encourage you to take your nausea medication as directed.   If you develop nausea and vomiting that is not controlled by your nausea medication, call the clinic.   BELOW ARE SYMPTOMS THAT SHOULD BE REPORTED IMMEDIATELY:  *FEVER GREATER THAN 100.5 F  *CHILLS WITH OR WITHOUT FEVER  NAUSEA AND VOMITING THAT IS NOT CONTROLLED WITH YOUR NAUSEA MEDICATION  *UNUSUAL SHORTNESS OF BREATH  *UNUSUAL BRUISING OR BLEEDING  TENDERNESS IN MOUTH AND THROAT WITH OR WITHOUT PRESENCE OF ULCERS  *URINARY PROBLEMS  *BOWEL PROBLEMS  UNUSUAL RASH Items with * indicate a potential emergency and should be followed up as soon as possible.  Feel free to call the clinic should you have any questions or concerns. The clinic phone number is [phone].  Please show the CHEMO ALERT CARD at check-in to the Emergency Department and triage nurse.   

## 2019-08-19 NOTE — Progress Notes (Signed)
Per Dr. Burr Medico, ok to treat with ANC 1.3.   Per [name], NP, hold nivestym injection per injection treatment plan parameters.

## 2019-08-20 NOTE — Progress Notes (Signed)
Pharmacist Chemotherapy Monitoring - Follow Up Assessment    I verify that I have reviewed each item in the below checklist:  Regimen:  . Patient due for treatment based on regimen frequency. . Plan date matches the patient's scheduled date. . Premedications are appropriate for the patient's regimen. . Assessed if patient has a supportive care therapy plans, and if patient is due for treatment.  Organ Function and Labs: . Appropriate labs and drug-specific monitoring are ordered prior to the patient's treatment.  Dose Assessment: . If applicable to patient's regimen, lifetime cumulative doses have been properly documented and assessed.  Toxicity Monitoring/Prevention: . Patient has appropriate take home medications prescribed.  . Medication allergies and previous infusion related reactions, if applicable, have been reviewed and addressed.  Order Review: . Treatment plan orders signed and/or patient is scheduled to see a provider prior to their treatment.  Cindy [name] 08/20/2019 3:02 PM

## 2019-08-25 NOTE — Progress Notes (Signed)
[place]   [phone] [phone]   Clinic Follow up Note   Patient Care Team: Mosie Lukes, MD as PCP - General (Family Medicine) Cherylann Banas, Cherly Anderson, MD (Inactive) (Obstetrics and Gynecology) Rockwell Germany, RN as Oncology Nurse Navigator Mauro Kaufmann, RN as Oncology Nurse Navigator Erroll Luna, MD as Consulting Physician (General Surgery) Truitt Merle, MD as Consulting Physician (Hematology) Gery Pray, MD as Consulting Physician (Radiation Oncology) 08/26/2019  CHIEF COMPLAINT: F/u left breast cancer   SUMMARY OF ONCOLOGIC HISTORY: Oncology History Overview Note  Cancer Staging Malignant neoplasm of upper-inner quadrant of left breast in female, estrogen receptor negative ([place]) Staging form: Breast, AJCC 8th Edition - Clinical stage from 02/06/2019: Stage IB (cT1c, cN0, cM0, G2, ER-, PR-, HER2-) - Signed by Truitt Merle, MD on 02/17/2019 - Pathologic stage from 03/10/2019: Stage IIA (pT2, pN0, cM0, G3, ER-, PR-, HER2-) - Signed by Truitt Merle, MD on 03/12/2019    Malignant neoplasm of upper-inner quadrant of left breast in female, estrogen receptor negative ([place])  02/04/2019 Mammogram   Diagnostic mammogram 02/04/19  IMPRESSION: Highly suspicious 1.7 x1.5 x1.7cm at 10:00 position of UPPER INNER LEFT breast mass 10 cm from nipple corresponding to the screening study finding. Tissue sampling recommended.   No abnormal LEFT axillary lymph nodes.   02/06/2019 Cancer Staging   Staging form: Breast, AJCC 8th Edition - Clinical stage from 02/06/2019: Stage IB (cT1c, cN0, cM0, G2, ER-, PR-, HER2-) - Signed by Truitt Merle, MD on 02/17/2019   02/06/2019 Initial Biopsy   Diagnosis 02/06/19 Breast, left, needle core biopsy, 10 o'clock, 10cm/n, ribbon clip - INVASIVE MAMMARY CARCINOMA.   02/06/2019 Receptors her2   Results: IMMUNOHISTOCHEMICAL AND MORPHOMETRIC ANALYSIS PERFORMED MANUALLY The tumor cells are NEGATIVE for Her2 (1+). Estrogen Receptor:  0%, NEGATIVE Progesterone Receptor: 0%, NEGATIVE Proliferation Marker Ki67: 40%   02/17/2019 Initial Diagnosis   Malignant neoplasm of upper-inner quadrant of left breast in female, estrogen receptor negative ([place])   03/10/2019 Cancer Staging   Staging form: Breast, AJCC 8th Edition - Pathologic stage from 03/10/2019: Stage IIA (pT2, pN0, cM0, G3, ER-, PR-, HER2-) - Signed by Truitt Merle, MD on 03/12/2019   03/10/2019 Surgery   LEFT BREAST LUMPECTOMY WITH RADIOACTIVE SEED AND LEFT SENTINEL LYMPH NODE MAPPING and PAC placement  by Dr. Brantley Stage  03/10/19   03/10/2019 Pathology Results   DIAGNOSIS: 03/10/19  A. BREAST, LEFT, LUMPECTOMY:  - Invasive ductal carcinoma, grade 3, 2.5 cm  - Carcinoma is 0.3 cm from superior margin  - Medial resection edge is 0.1 cm from carcinoma (final medial margin is  represented by part 2 which is negative for carcinoma)  - Negative for lymphovascular or perineural invasion  - Biopsy site changes  - See oncology table   B. BREAST, LEFT MEDIAL MARGIN, EXCISION:  - Benign breast parenchyma, negative for carcinoma   C. LYMPH NODE, LEFT AXILLARY, SENTINEL BIOPSY:  - Lymph node, negative for carcinoma (0/1)    04/06/2019 Imaging   Bone scan IMPRESSION: Mild focus of uptake is seen involving the anterior portion of left upper rib, but there is no corresponding abnormality seen on the CT scan of the same day. Potentially this may represent degenerative change or secondary to overlying postsurgical change. Attention on follow-up is recommended.   No definite scintigraphic evidence of osseous metastases.   04/06/2019 Imaging   CT CAP IMPRESSION: 1. Surgical changes left breast with borderline enlarged left axillary lymph node. No other definite evidence for metastatic  disease in the chest, abdomen, or pelvis. 2. Tiny bilateral pulmonary nodules. Attention on follow-up recommended. 3. Scattered small hypoattenuating lesions in the liver parenchyma. Some of  these have attenuation higher than would be expected for simple fluid and may be cysts complicated by proteinaceous debris or hemorrhage. MRI without and with contrast recommended to further evaluate. 4. Small to moderate hiatal hernia. 5. Aortic Atherosclerosis (ICD10-I70.0).   04/08/2019 -  Chemotherapy   chemo AC q2weeks for 4 cycles starting 04/08/19-05/20/19 followed by Taxol weekly for 12 weeks starting 06/09/19.       CURRENT THERAPY:  chemo AC q2weeks for 4 cycles starting 04/08/19-12/9/20followed by Taxolweekly starting 06/09/19 for 12 weeks.added Granix with Taxol C6.  INTERVAL HISTORY: Cindy [name] returns for f/u and treatment as scheduled. She completed cycle 8 taxol on 08/19/19. She didn't feel as bad this time. Had little more energy over the weekend and was able to walk outside last 2 days. Mild DOE she attributes to lack of physical activity over 3 months of chemo. She also wears a bra when she comes to appointments which is restrictive. She has felt dizzy on standing recently, not much today.  She has cycles of burning, then nausea, then cough from her hiatal hernia. No vomiting. Denies pain. No chest pain.  No recent fever or chills.  Appetite is stable. She drinks 1.5 bottles of water and a cup of coffee per day. Has mild constipation that does not require medication.    MEDICAL HISTORY:  Past Medical History:  Diagnosis Date  . Allergy   . Depression    ? if bipolar works the best  . Elevated fasting blood sugar    hx  . H/O measles   . Headache   . History of chicken pox   . Hx of migraines   . Microscopic hematuria    advised to see urology ? never went  . Preventative health care 06/20/2011    SURGICAL HISTORY: Past Surgical History:  Procedure Laterality Date  . BREAST LUMPECTOMY WITH RADIOACTIVE SEED AND SENTINEL LYMPH NODE BIOPSY Left 03/10/2019   Procedure: LEFT BREAST LUMPECTOMY WITH RADIOACTIVE SEED AND LEFT SENTINEL LYMPH NODE MAPPING;  Surgeon:  Erroll Luna, MD;  Location: [facility];  Service: General;  Laterality: Left;  . endometrial polyps     2 surgeries by Dr Cathlean Cower  . EYE SURGERY  04/29/2009   Rt eye tighten up  . PORTACATH PLACEMENT N/A 03/10/2019   Procedure: INSERTION PORT-A-CATH WITH ULTRASOUND;  Surgeon: Erroll Luna, MD;  Location: [facility];  Service: General;  Laterality: N/A;  . steel plate in skull Left    from a crushed skull    I have reviewed the social history and family history with the patient and they are unchanged from previous note.  ALLERGIES:  is allergic to bromfed and venlafaxine.  MEDICATIONS:  Current Outpatient Medications  Medication Sig Dispense Refill  . FLUoxetine (PROZAC) 40 MG capsule Take 1 capsule (40 mg total) by mouth daily. 90 capsule 2  . lidocaine-prilocaine (EMLA) cream Apply to affected area once 30 g 3  . ondansetron (ZOFRAN) 8 MG tablet Take 1 tablet (8 mg total) by mouth 2 (two) times daily as needed. Start on the third day after chemotherapy. 30 tablet 2  . prochlorperazine (COMPAZINE) 10 MG tablet Take 1 tablet (10 mg total) by mouth every 6 (six) hours as needed (Nausea or vomiting). 30 tablet 2   No current facility-administered medications for this visit.  PHYSICAL EXAMINATION: ECOG PERFORMANCE STATUS: 2 - Symptomatic, <50% confined to bed  Vitals:   08/26/19 1300 08/26/19 1334  BP: 96/69 (!) 86/66  Pulse: (!) 109 (!) 115  Resp:    Temp:    SpO2: 98% 96%   Filed Weights   08/26/19 1256  Weight: 148 lb 14.4 oz (67.5 kg)    GENERAL:alert, no distress and comfortable SKIN: no rash  EYES:  sclera clear LUNGS: clear with normal breathing effort HEART: tachycardic, regular rhythm. No lower extremity edema NEURO: alert & oriented x 3 with fluent speech, normal gait  PAC without erythema   LABORATORY DATA:  I have reviewed the data as listed CBC Latest Ref Rng & Units 08/26/2019 08/19/2019 08/05/2019  WBC 4.0 - 10.5 K/uL 3.1(L) 2.9(L) 3.0(L)  Hemoglobin  12.0 - 15.0 g/dL 11.1(L) 10.7(L) 10.4(L)  Hematocrit 36.0 - 46.0 % 33.2(L) 32.3(L) 31.5(L)  Platelets 150 - 400 K/uL 228 265 200     CMP Latest Ref Rng & Units 08/26/2019 08/19/2019 08/05/2019  Glucose 70 - 99 mg/dL 98 102(H) 118(H)  BUN 8 - 23 mg/dL 13 13 13   Creatinine 0.44 - 1.00 mg/dL 0.78 0.77 0.76  Sodium 135 - 145 mmol/L 140 140 142  Potassium 3.5 - 5.1 mmol/L 4.2 4.1 3.9  Chloride 98 - 111 mmol/L 106 107 105  CO2 22 - 32 mmol/L 26 27 28   Calcium 8.9 - 10.3 mg/dL 9.2 9.1 9.3  Total Protein 6.5 - 8.1 g/dL 6.3(L) 6.3(L) 6.3(L)  Total Bilirubin 0.3 - 1.2 mg/dL 0.4 0.3 0.3  Alkaline Phos 38 - 126 U/L 53 54 59  AST 15 - 41 U/L 17 19 18   ALT 0 - 44 U/L 18 20 30       RADIOGRAPHIC STUDIES: I have personally reviewed the radiological images as listed and agreed with the findings in the report. No results found.   ASSESSMENT & PLAN: Cindy Kugelman McIntoshis a 69 y.o.femalewith   1.Malignant neoplasm of upper-inner quadrant of left breast, StageIIA,p(T2N0M0), ER/PR/HER2-,triple negative,GradeIII -She was diagnosed in 02/2019. She underwent left breast lumpectomy with SLNB on 03/10/19. -staging work up after surgery showed mildly enlarged left axillary LN, tiny pulmonary nodules, and indeterminate liver lesions, no definite evidence of metastasis. She can not have MRI due to metal implant -due to her aggressive triple negative breast cancer, adjuvant chemo was recommended to reduce recurrence risk. She completed ddAC q2 weeks x4 (dose reduced C4) from 04/08/19 to 05/20/19. She did not tolerate well with decreased appetite, n/v, neutropenia and anemia requiring blood transfusion  -She began weekly taxol on 12/29, s/p 8 cycles, tolerates moderately well mainly fatigue  -she developed neutropenia after week 2 and treatment was postponed, afebrile. She recovered well. granix was added as needed ANC <1.3, taxol was also dose-reduced   2. Genetic Testing  -Her mother had colon cancer and  father had bladder cancer, her MGF had cancer and her maternal great grandmother had breast cancer.  -Will check with Genetics to see if she is eligible for testing. She is interested ifinsurance covers it.  3. anemia -Secondary to chemo, required RBC transfusion on 12/23 after cycle 4 AC. -improved, stable   4. Acid reflux, hiatal hernia   -Does cause mild nausea and intermittent cough.  -previously on prilosec but lost its efficacy, currently mild without treatment. -monitoring -stable  5. Nausea and Vomiting, Loss of appetite, weight loss -Hernausea worsened withcycles 3 and 4 of AC,but has been mostly controlled with antiemetics. -appetite improved after she  completed AC -monitor. Mild nausea mostly related to hernia  Disposition:  Ms. Haseley appears stable. She completed 4 cycles of adjuvant AC and cycle 8 Taxol. She tolerates chemo fairly well, mainly fatigue, and ongoing symptoms from her hiatal hernia. She recovers moderately well. She has evidence of orthostatic hypotension today, with dizziness and tachycardia. She is dehydrated likely due to inadequate po. I encouraged her to drink more. Will support with 1 L NS today. Labs otherwise adequate to proceed with cycle 9 Taxol without dosage adjustment today. She does not need granix with this cycle.   The plan is for her to complete adjuvant chemo on 3/31. She will then proceed with radiation. I will send a message to Dr. Sondra Come today. Phone f/u in 6 weeks to see how she has recovered from chemo.   All questions were answered. The patient knows to call the clinic with any problems, questions or concerns. No barriers to learning was detected.     Alla Feeling, NP 08/26/19

## 2019-08-26 ENCOUNTER — Inpatient Hospital Stay: Payer: PPO

## 2019-08-26 ENCOUNTER — Other Ambulatory Visit: Payer: Self-pay

## 2019-08-26 ENCOUNTER — Inpatient Hospital Stay (HOSPITAL_BASED_OUTPATIENT_CLINIC_OR_DEPARTMENT_OTHER): Payer: PPO | Admitting: Nurse Practitioner

## 2019-08-26 ENCOUNTER — Encounter: Payer: Self-pay | Admitting: Nurse Practitioner

## 2019-08-26 VITALS — BP 86/66 | HR 115 | Temp 98.2°F | Resp 18 | Ht 64.0 in | Wt 148.9 lb

## 2019-08-26 VITALS — BP 121/79 | HR 96

## 2019-08-26 DIAGNOSIS — Z95828 Presence of other vascular implants and grafts: Secondary | ICD-10-CM

## 2019-08-26 DIAGNOSIS — Z5111 Encounter for antineoplastic chemotherapy: Secondary | ICD-10-CM | POA: Diagnosis not present

## 2019-08-26 DIAGNOSIS — Z171 Estrogen receptor negative status [ER-]: Secondary | ICD-10-CM

## 2019-08-26 DIAGNOSIS — C50212 Malignant neoplasm of upper-inner quadrant of left female breast: Secondary | ICD-10-CM | POA: Diagnosis not present

## 2019-08-26 DIAGNOSIS — I951 Orthostatic hypotension: Secondary | ICD-10-CM

## 2019-08-26 LAB — CBC WITH DIFFERENTIAL (CANCER CENTER ONLY)
Abs Immature Granulocytes: 0.02 10*3/uL (ref 0.00–0.07)
Basophils Absolute: 0 10*3/uL (ref 0.0–0.1)
Basophils Relative: 0 %
Eosinophils Absolute: 0.1 10*3/uL (ref 0.0–0.5)
Eosinophils Relative: 2 %
HCT: 33.2 % — ABNORMAL LOW (ref 36.0–46.0)
Hemoglobin: 11.1 g/dL — ABNORMAL LOW (ref 12.0–15.0)
Immature Granulocytes: 1 %
Lymphocytes Relative: 30 %
Lymphs Abs: 1 10*3/uL (ref 0.7–4.0)
MCH: 32.6 pg (ref 26.0–34.0)
MCHC: 33.4 g/dL (ref 30.0–36.0)
MCV: 97.6 fL (ref 80.0–100.0)
Monocytes Absolute: 0.4 10*3/uL (ref 0.1–1.0)
Monocytes Relative: 12 %
Neutro Abs: 1.7 10*3/uL (ref 1.7–7.7)
Neutrophils Relative %: 55 %
Platelet Count: 228 10*3/uL (ref 150–400)
RBC: 3.4 MIL/uL — ABNORMAL LOW (ref 3.87–5.11)
RDW: 13.8 % (ref 11.5–15.5)
WBC Count: 3.1 10*3/uL — ABNORMAL LOW (ref 4.0–10.5)
nRBC: 0 % (ref 0.0–0.2)

## 2019-08-26 LAB — CMP (CANCER CENTER ONLY)
ALT: 18 U/L (ref 0–44)
AST: 17 U/L (ref 15–41)
Albumin: 3.8 g/dL (ref 3.5–5.0)
Alkaline Phosphatase: 53 U/L (ref 38–126)
Anion gap: 8 (ref 5–15)
BUN: 13 mg/dL (ref 8–23)
CO2: 26 mmol/L (ref 22–32)
Calcium: 9.2 mg/dL (ref 8.9–10.3)
Chloride: 106 mmol/L (ref 98–111)
Creatinine: 0.78 mg/dL (ref 0.44–1.00)
GFR, Est AFR Am: >60 mL/min (ref >60)
GFR, Estimated: >60 mL/min (ref >60)
Glucose, Bld: 98 mg/dL (ref 70–99)
Potassium: 4.2 mmol/L (ref 3.5–5.1)
Sodium: 140 mmol/L (ref 135–145)
Total Bilirubin: 0.4 mg/dL (ref 0.3–1.2)
Total Protein: 6.3 g/dL — ABNORMAL LOW (ref 6.5–8.1)

## 2019-08-26 MED ORDER — SODIUM CHLORIDE 0.9 % IV SOLN
Freq: Once | INTRAVENOUS | Status: AC
  Filled 2019-08-26: qty 250

## 2019-08-26 MED ORDER — FAMOTIDINE IN NACL 20-0.9 MG/50ML-% IV SOLN
INTRAVENOUS | Status: AC
  Filled 2019-08-26: qty 50

## 2019-08-26 MED ORDER — LORATADINE 10 MG PO TABS
ORAL_TABLET | ORAL | Status: AC
  Filled 2019-08-26: qty 1

## 2019-08-26 MED ORDER — SODIUM CHLORIDE 0.9 % IV SOLN
20.0000 mg | Freq: Once | INTRAVENOUS | Status: AC
  Administered 2019-08-26: 20 mg via INTRAVENOUS
  Filled 2019-08-26: qty 20

## 2019-08-26 MED ORDER — SODIUM CHLORIDE 0.9% FLUSH
10.0000 mL | INTRAVENOUS | Status: DC | PRN
  Administered 2019-08-26: 10 mL
  Filled 2019-08-26: qty 10

## 2019-08-26 MED ORDER — LORATADINE 10 MG PO TABS
10.0000 mg | ORAL_TABLET | Freq: Every day | ORAL | Status: DC
  Administered 2019-08-26: 10 mg via ORAL

## 2019-08-26 MED ORDER — FAMOTIDINE IN NACL 20-0.9 MG/50ML-% IV SOLN
20.0000 mg | Freq: Once | INTRAVENOUS | Status: AC
  Administered 2019-08-26: 20 mg via INTRAVENOUS

## 2019-08-26 MED ORDER — SODIUM CHLORIDE 0.9 % IV SOLN
70.0000 mg/m2 | Freq: Once | INTRAVENOUS | Status: AC
  Administered 2019-08-26: 126 mg via INTRAVENOUS
  Filled 2019-08-26: qty 21

## 2019-08-26 MED ORDER — SODIUM CHLORIDE 0.9% FLUSH
10.0000 mL | Freq: Once | INTRAVENOUS | Status: AC
  Administered 2019-08-26: 10 mL
  Filled 2019-08-26: qty 10

## 2019-08-26 MED ORDER — HEPARIN SOD (PORK) LOCK FLUSH 100 UNIT/ML IV SOLN
500.0000 [IU] | Freq: Once | INTRAVENOUS | Status: AC | PRN
  Administered 2019-08-26: 16:00:00 500 [IU]
  Filled 2019-08-26: qty 5

## 2019-08-26 NOTE — Progress Notes (Signed)
Ok to treat with current vitals per [place].  Will give extra IVF.

## 2019-08-26 NOTE — Patient Instructions (Signed)

## 2019-08-26 NOTE — Progress Notes (Signed)
Paclitaxel infusion to be given over 1 hour given dose and weekly frequency.

## 2019-08-27 ENCOUNTER — Telehealth: Payer: Self-pay | Admitting: Nurse Practitioner

## 2019-08-27 NOTE — Telephone Encounter (Signed)
Scheduled appt per 3/17 los.  Spoke with pt and scheduled the phone visit and not the lab and port flush.  Pt wanted to wait till her radiation appts were scheduled.  Informed the MD.  Patient is aware of her scheduled phone visit day and time.

## 2019-09-02 ENCOUNTER — Inpatient Hospital Stay: Payer: PPO

## 2019-09-02 ENCOUNTER — Other Ambulatory Visit: Payer: Self-pay | Admitting: Nurse Practitioner

## 2019-09-02 ENCOUNTER — Other Ambulatory Visit: Payer: Self-pay

## 2019-09-02 VITALS — BP 115/72 | HR 93 | Temp 97.8°F | Resp 18

## 2019-09-02 DIAGNOSIS — Z5111 Encounter for antineoplastic chemotherapy: Secondary | ICD-10-CM | POA: Diagnosis not present

## 2019-09-02 DIAGNOSIS — C50212 Malignant neoplasm of upper-inner quadrant of left female breast: Secondary | ICD-10-CM

## 2019-09-02 DIAGNOSIS — Z95828 Presence of other vascular implants and grafts: Secondary | ICD-10-CM

## 2019-09-02 DIAGNOSIS — Z171 Estrogen receptor negative status [ER-]: Secondary | ICD-10-CM

## 2019-09-02 LAB — CMP (CANCER CENTER ONLY)
ALT: 18 U/L (ref 0–44)
AST: 15 U/L (ref 15–41)
Albumin: 3.7 g/dL (ref 3.5–5.0)
Alkaline Phosphatase: 52 U/L (ref 38–126)
Anion gap: 7 (ref 5–15)
BUN: 18 mg/dL (ref 8–23)
CO2: 27 mmol/L (ref 22–32)
Calcium: 9.3 mg/dL (ref 8.9–10.3)
Chloride: 105 mmol/L (ref 98–111)
Creatinine: 0.82 mg/dL (ref 0.44–1.00)
GFR, Est AFR Am: >60 mL/min (ref >60)
GFR, Estimated: >60 mL/min (ref >60)
Glucose, Bld: 99 mg/dL (ref 70–99)
Potassium: 4.1 mmol/L (ref 3.5–5.1)
Sodium: 139 mmol/L (ref 135–145)
Total Bilirubin: 0.4 mg/dL (ref 0.3–1.2)
Total Protein: 6.2 g/dL — ABNORMAL LOW (ref 6.5–8.1)

## 2019-09-02 LAB — CBC WITH DIFFERENTIAL (CANCER CENTER ONLY)
Abs Immature Granulocytes: 0.01 10*3/uL (ref 0.00–0.07)
Basophils Absolute: 0 10*3/uL (ref 0.0–0.1)
Basophils Relative: 1 %
Eosinophils Absolute: 0 10*3/uL (ref 0.0–0.5)
Eosinophils Relative: 2 %
HCT: 31.1 % — ABNORMAL LOW (ref 36.0–46.0)
Hemoglobin: 10.4 g/dL — ABNORMAL LOW (ref 12.0–15.0)
Immature Granulocytes: 1 %
Lymphocytes Relative: 41 %
Lymphs Abs: 0.8 10*3/uL (ref 0.7–4.0)
MCH: 32.4 pg (ref 26.0–34.0)
MCHC: 33.4 g/dL (ref 30.0–36.0)
MCV: 96.9 fL (ref 80.0–100.0)
Monocytes Absolute: 0.2 10*3/uL (ref 0.1–1.0)
Monocytes Relative: 8 %
Neutro Abs: 0.9 10*3/uL — ABNORMAL LOW (ref 1.7–7.7)
Neutrophils Relative %: 47 %
Platelet Count: 216 10*3/uL (ref 150–400)
RBC: 3.21 MIL/uL — ABNORMAL LOW (ref 3.87–5.11)
RDW: 13.9 % (ref 11.5–15.5)
WBC Count: 1.9 10*3/uL — ABNORMAL LOW (ref 4.0–10.5)
nRBC: 0 % (ref 0.0–0.2)

## 2019-09-02 MED ORDER — HEPARIN SOD (PORK) LOCK FLUSH 100 UNIT/ML IV SOLN
500.0000 [IU] | Freq: Once | INTRAVENOUS | Status: AC | PRN
  Administered 2019-09-02: 16:00:00 500 [IU]
  Filled 2019-09-02: qty 5

## 2019-09-02 MED ORDER — SODIUM CHLORIDE 0.9 % IV SOLN
70.0000 mg/m2 | Freq: Once | INTRAVENOUS | Status: AC
  Administered 2019-09-02: 126 mg via INTRAVENOUS
  Filled 2019-09-02: qty 21

## 2019-09-02 MED ORDER — LORATADINE 10 MG PO TABS
ORAL_TABLET | ORAL | Status: AC
  Filled 2019-09-02: qty 1

## 2019-09-02 MED ORDER — FAMOTIDINE IN NACL 20-0.9 MG/50ML-% IV SOLN
20.0000 mg | Freq: Once | INTRAVENOUS | Status: AC
  Administered 2019-09-02: 20 mg via INTRAVENOUS

## 2019-09-02 MED ORDER — HEPARIN SOD (PORK) LOCK FLUSH 100 UNIT/ML IV SOLN
500.0000 [IU] | Freq: Once | INTRAVENOUS | Status: DC
  Filled 2019-09-02: qty 5

## 2019-09-02 MED ORDER — LORATADINE 10 MG PO TABS
10.0000 mg | ORAL_TABLET | Freq: Every day | ORAL | Status: DC
  Administered 2019-09-02: 14:00:00 10 mg via ORAL

## 2019-09-02 MED ORDER — SODIUM CHLORIDE 0.9 % IV SOLN
Freq: Once | INTRAVENOUS | Status: AC
  Filled 2019-09-02: qty 250

## 2019-09-02 MED ORDER — SODIUM CHLORIDE 0.9 % IV SOLN
20.0000 mg | Freq: Once | INTRAVENOUS | Status: AC
  Administered 2019-09-02: 20 mg via INTRAVENOUS
  Filled 2019-09-02: qty 20

## 2019-09-02 MED ORDER — SODIUM CHLORIDE 0.9% FLUSH
10.0000 mL | INTRAVENOUS | Status: DC | PRN
  Administered 2019-09-02: 10 mL
  Filled 2019-09-02: qty 10

## 2019-09-02 MED ORDER — FAMOTIDINE IN NACL 20-0.9 MG/50ML-% IV SOLN
INTRAVENOUS | Status: AC
  Filled 2019-09-02: qty 50

## 2019-09-02 MED ORDER — SODIUM CHLORIDE 0.9% FLUSH
10.0000 mL | Freq: Once | INTRAVENOUS | Status: AC
  Administered 2019-09-02: 10 mL
  Filled 2019-09-02: qty 10

## 2019-09-02 NOTE — Patient Instructions (Signed)
[facility] Discharge Instructions for Patients Receiving Chemotherapy  Today you received the following chemotherapy agents:  Taxol.  To help prevent nausea and vomiting after your treatment, we encourage you to take your nausea medication as directed.   If you develop nausea and vomiting that is not controlled by your nausea medication, call the clinic.   BELOW ARE SYMPTOMS THAT SHOULD BE REPORTED IMMEDIATELY:  *FEVER GREATER THAN 100.5 F  *CHILLS WITH OR WITHOUT FEVER  NAUSEA AND VOMITING THAT IS NOT CONTROLLED WITH YOUR NAUSEA MEDICATION  *UNUSUAL SHORTNESS OF BREATH  *UNUSUAL BRUISING OR BLEEDING  TENDERNESS IN MOUTH AND THROAT WITH OR WITHOUT PRESENCE OF ULCERS  *URINARY PROBLEMS  *BOWEL PROBLEMS  UNUSUAL RASH Items with * indicate a potential emergency and should be followed up as soon as possible.  Feel free to call the clinic should you have any questions or concerns. The clinic phone number is [phone].  Please show the CHEMO ALERT CARD at check-in to the Emergency Department and triage nurse.   

## 2019-09-02 NOTE — Progress Notes (Signed)
I spoke to Cindy [name] in treatment room. She is nearing the end of adjuvant chemo. We briefly reviewed her initial staging work up CT CAP and bone scan. There are several areas that warrant f/u, including enlarged left axillary and subpectoral LNs, pulmonary nodules, and indeterminate liver lesions. MRI is not possible due to her metal implant. Will do CT chest and CT AP w/wo contrast, liver protocol. She agrees with the plan. Will schedule after she completes chemo. Orders placed.   ANC is 0.9 today, will treat with taxol as planned and add granix on 3/25. Patient agrees. Pharmacy aware.   [name], NP

## 2019-09-02 NOTE — Progress Notes (Signed)
Per Dr. Burr Medico: OK to treat with ANC 0.9 as long as patient is agreeable to come back for filgrastim injection. Patient stated she can come back tomorrow or Saturday afternoon. Will send scheduling message for injection appointment

## 2019-09-03 ENCOUNTER — Encounter: Payer: Self-pay | Admitting: Nurse Practitioner

## 2019-09-03 ENCOUNTER — Other Ambulatory Visit: Payer: Self-pay

## 2019-09-03 ENCOUNTER — Inpatient Hospital Stay: Payer: PPO

## 2019-09-03 VITALS — BP 121/74 | HR 96 | Temp 98.3°F | Resp 20

## 2019-09-03 DIAGNOSIS — Z5111 Encounter for antineoplastic chemotherapy: Secondary | ICD-10-CM | POA: Diagnosis not present

## 2019-09-03 DIAGNOSIS — Z171 Estrogen receptor negative status [ER-]: Secondary | ICD-10-CM

## 2019-09-03 DIAGNOSIS — Z95828 Presence of other vascular implants and grafts: Secondary | ICD-10-CM

## 2019-09-03 DIAGNOSIS — C50212 Malignant neoplasm of upper-inner quadrant of left female breast: Secondary | ICD-10-CM

## 2019-09-03 MED ORDER — FILGRASTIM-AAFI 300 MCG/0.5ML IJ SOSY
300.0000 ug | PREFILLED_SYRINGE | Freq: Once | INTRAMUSCULAR | Status: AC
  Administered 2019-09-03: 300 ug via SUBCUTANEOUS
  Filled 2019-09-03: qty 0.5

## 2019-09-03 NOTE — Patient Instructions (Signed)

## 2019-09-03 NOTE — Progress Notes (Signed)
Pharmacist Chemotherapy Monitoring - Follow Up Assessment    I verify that I have reviewed each item in the below checklist:  . Regimen for the patient is scheduled for the appropriate day and plan matches scheduled date. Marland Kitchen Appropriate non-routine labs are ordered dependent on drug ordered. . If applicable, additional medications reviewed and ordered per protocol based on lifetime cumulative doses and/or treatment regimen.   Plan for follow-up and/or issues identified: Yes . I-vent associated with next due treatment: Yes . MD and/or nursing notified: No    Kennith Center, Pharm.D., CPP 09/03/2019@1 :24 PM

## 2019-09-03 NOTE — Progress Notes (Deleted)
Pharmacist Chemotherapy Monitoring - Follow Up Assessment    I verify that I have reviewed each item in the below checklist:  . Regimen for the patient is scheduled for the appropriate day and plan matches scheduled date. Marland Kitchen Appropriate non-routine labs are ordered dependent on drug ordered. . If applicable, additional medications reviewed and ordered per protocol based on lifetime cumulative doses and/or treatment regimen.   Plan for follow-up and/or issues identified: No . I-vent associated with next due treatment: No . MD and/or nursing notified: No    Kennith Center, Pharm.D., CPP 09/03/2019@1 :23 PM

## 2019-09-08 ENCOUNTER — Other Ambulatory Visit: Payer: Self-pay | Admitting: *Deleted

## 2019-09-08 DIAGNOSIS — C50212 Malignant neoplasm of upper-inner quadrant of left female breast: Secondary | ICD-10-CM

## 2019-09-08 DIAGNOSIS — Z171 Estrogen receptor negative status [ER-]: Secondary | ICD-10-CM

## 2019-09-09 ENCOUNTER — Inpatient Hospital Stay: Payer: PPO

## 2019-09-09 ENCOUNTER — Ambulatory Visit: Payer: PPO

## 2019-09-09 ENCOUNTER — Ambulatory Visit: Payer: PPO | Admitting: Nurse Practitioner

## 2019-09-09 ENCOUNTER — Other Ambulatory Visit: Payer: PPO

## 2019-09-09 ENCOUNTER — Encounter: Payer: Self-pay | Admitting: *Deleted

## 2019-09-09 ENCOUNTER — Other Ambulatory Visit: Payer: Self-pay

## 2019-09-09 VITALS — BP 118/73 | HR 96 | Temp 98.1°F | Resp 18

## 2019-09-09 DIAGNOSIS — C50212 Malignant neoplasm of upper-inner quadrant of left female breast: Secondary | ICD-10-CM

## 2019-09-09 DIAGNOSIS — Z171 Estrogen receptor negative status [ER-]: Secondary | ICD-10-CM

## 2019-09-09 DIAGNOSIS — Z95828 Presence of other vascular implants and grafts: Secondary | ICD-10-CM

## 2019-09-09 DIAGNOSIS — Z5111 Encounter for antineoplastic chemotherapy: Secondary | ICD-10-CM | POA: Diagnosis not present

## 2019-09-09 LAB — CMP (CANCER CENTER ONLY)
ALT: 21 U/L (ref 0–44)
AST: 18 U/L (ref 15–41)
Albumin: 3.7 g/dL (ref 3.5–5.0)
Alkaline Phosphatase: 55 U/L (ref 38–126)
Anion gap: 10 (ref 5–15)
BUN: 13 mg/dL (ref 8–23)
CO2: 25 mmol/L (ref 22–32)
Calcium: 8.8 mg/dL — ABNORMAL LOW (ref 8.9–10.3)
Chloride: 107 mmol/L (ref 98–111)
Creatinine: 0.78 mg/dL (ref 0.44–1.00)
GFR, Est AFR Am: >60 mL/min (ref >60)
GFR, Estimated: >60 mL/min (ref >60)
Glucose, Bld: 100 mg/dL — ABNORMAL HIGH (ref 70–99)
Potassium: 4.1 mmol/L (ref 3.5–5.1)
Sodium: 142 mmol/L (ref 135–145)
Total Bilirubin: 0.3 mg/dL (ref 0.3–1.2)
Total Protein: 6.1 g/dL — ABNORMAL LOW (ref 6.5–8.1)

## 2019-09-09 LAB — CBC WITH DIFFERENTIAL (CANCER CENTER ONLY)
Abs Immature Granulocytes: 0.03 10*3/uL (ref 0.00–0.07)
Basophils Absolute: 0 10*3/uL (ref 0.0–0.1)
Basophils Relative: 1 %
Eosinophils Absolute: 0 10*3/uL (ref 0.0–0.5)
Eosinophils Relative: 2 %
HCT: 30.5 % — ABNORMAL LOW (ref 36.0–46.0)
Hemoglobin: 10.3 g/dL — ABNORMAL LOW (ref 12.0–15.0)
Immature Granulocytes: 2 %
Lymphocytes Relative: 40 %
Lymphs Abs: 0.8 10*3/uL (ref 0.7–4.0)
MCH: 33 pg (ref 26.0–34.0)
MCHC: 33.8 g/dL (ref 30.0–36.0)
MCV: 97.8 fL (ref 80.0–100.0)
Monocytes Absolute: 0.4 10*3/uL (ref 0.1–1.0)
Monocytes Relative: 19 %
Neutro Abs: 0.7 10*3/uL — ABNORMAL LOW (ref 1.7–7.7)
Neutrophils Relative %: 36 %
Platelet Count: 211 10*3/uL (ref 150–400)
RBC: 3.12 MIL/uL — ABNORMAL LOW (ref 3.87–5.11)
RDW: 14.4 % (ref 11.5–15.5)
WBC Count: 1.9 10*3/uL — ABNORMAL LOW (ref 4.0–10.5)
nRBC: 0 % (ref 0.0–0.2)

## 2019-09-09 MED ORDER — SODIUM CHLORIDE 0.9% FLUSH
10.0000 mL | Freq: Once | INTRAVENOUS | Status: AC
  Administered 2019-09-09: 10 mL
  Filled 2019-09-09: qty 10

## 2019-09-09 MED ORDER — HEPARIN SOD (PORK) LOCK FLUSH 100 UNIT/ML IV SOLN
500.0000 [IU] | Freq: Once | INTRAVENOUS | Status: AC
  Administered 2019-09-09: 500 [IU]
  Filled 2019-09-09: qty 5

## 2019-09-09 MED ORDER — FILGRASTIM-AAFI 300 MCG/0.5ML IJ SOSY
300.0000 ug | PREFILLED_SYRINGE | Freq: Once | INTRAMUSCULAR | Status: AC
  Administered 2019-09-09: 300 ug via SUBCUTANEOUS
  Filled 2019-09-09: qty 0.5

## 2019-09-09 MED ORDER — HEPARIN SOD (PORK) LOCK FLUSH 100 UNIT/ML IV SOLN
500.0000 [IU] | Freq: Once | INTRAVENOUS | Status: DC
  Filled 2019-09-09: qty 5

## 2019-09-09 NOTE — Progress Notes (Signed)
Patient will not receive treatment today with ANC 0.7 per [facility] NP. She will instead receive pfilgratim injection and reschedule treatment.

## 2019-09-10 ENCOUNTER — Telehealth: Payer: Self-pay | Admitting: Hematology

## 2019-09-10 ENCOUNTER — Encounter: Payer: Self-pay | Admitting: Nurse Practitioner

## 2019-09-10 NOTE — Progress Notes (Signed)
[place]   [phone] [phone]   Clinic Follow up Note   Patient Care Team: Mosie Lukes, MD as PCP - General (Family Medicine) Cherylann Banas, Cherly Anderson, MD (Inactive) (Obstetrics and Gynecology) Rockwell Germany, RN as Oncology Nurse Navigator Mauro Kaufmann, RN as Oncology Nurse Navigator Erroll Luna, MD as Consulting Physician (General Surgery) Truitt Merle, MD as Consulting Physician (Hematology) Gery Pray, MD as Consulting Physician (Radiation Oncology)  Date of Service:  09/14/2019  CHIEF COMPLAINT: F/u of left breast cancer  SUMMARY OF ONCOLOGIC HISTORY: Oncology History Overview Note  Cancer Staging Malignant neoplasm of upper-inner quadrant of left breast in female, estrogen receptor negative ([place]) Staging form: Breast, AJCC 8th Edition - Clinical stage from 02/06/2019: Stage IB (cT1c, cN0, cM0, G2, ER-, PR-, HER2-) - Signed by Truitt Merle, MD on 02/17/2019 - Pathologic stage from 03/10/2019: Stage IIA (pT2, pN0, cM0, G3, ER-, PR-, HER2-) - Signed by Truitt Merle, MD on 03/12/2019    Malignant neoplasm of upper-inner quadrant of left breast in female, estrogen receptor negative ([place])  02/04/2019 Mammogram   Diagnostic mammogram 02/04/19  IMPRESSION: Highly suspicious 1.7 x1.5 x1.7cm at 10:00 position of UPPER INNER LEFT breast mass 10 cm from nipple corresponding to the screening study finding. Tissue sampling recommended.   No abnormal LEFT axillary lymph nodes.   02/06/2019 Cancer Staging   Staging form: Breast, AJCC 8th Edition - Clinical stage from 02/06/2019: Stage IB (cT1c, cN0, cM0, G2, ER-, PR-, HER2-) - Signed by Truitt Merle, MD on 02/17/2019   02/06/2019 Initial Biopsy   Diagnosis 02/06/19 Breast, left, needle core biopsy, 10 o'clock, 10cm/n, ribbon clip - INVASIVE MAMMARY CARCINOMA.   02/06/2019 Receptors her2   Results: IMMUNOHISTOCHEMICAL AND MORPHOMETRIC ANALYSIS PERFORMED MANUALLY The tumor cells are NEGATIVE for Her2  (1+). Estrogen Receptor: 0%, NEGATIVE Progesterone Receptor: 0%, NEGATIVE Proliferation Marker Ki67: 40%   02/17/2019 Initial Diagnosis   Malignant neoplasm of upper-inner quadrant of left breast in female, estrogen receptor negative ([place])   03/10/2019 Cancer Staging   Staging form: Breast, AJCC 8th Edition - Pathologic stage from 03/10/2019: Stage IIA (pT2, pN0, cM0, G3, ER-, PR-, HER2-) - Signed by Truitt Merle, MD on 03/12/2019   03/10/2019 Surgery   LEFT BREAST LUMPECTOMY WITH RADIOACTIVE SEED AND LEFT SENTINEL LYMPH NODE MAPPING and PAC placement  by Dr. Brantley Stage  03/10/19   03/10/2019 Pathology Results   DIAGNOSIS: 03/10/19  A. BREAST, LEFT, LUMPECTOMY:  - Invasive ductal carcinoma, grade 3, 2.5 cm  - Carcinoma is 0.3 cm from superior margin  - Medial resection edge is 0.1 cm from carcinoma (final medial margin is  represented by part 2 which is negative for carcinoma)  - Negative for lymphovascular or perineural invasion  - Biopsy site changes  - See oncology table   B. BREAST, LEFT MEDIAL MARGIN, EXCISION:  - Benign breast parenchyma, negative for carcinoma   C. LYMPH NODE, LEFT AXILLARY, SENTINEL BIOPSY:  - Lymph node, negative for carcinoma (0/1)    04/06/2019 Imaging   Bone scan IMPRESSION: Mild focus of uptake is seen involving the anterior portion of left upper rib, but there is no corresponding abnormality seen on the CT scan of the same day. Potentially this may represent degenerative change or secondary to overlying postsurgical change. Attention on follow-up is recommended.   No definite scintigraphic evidence of osseous metastases.   04/06/2019 Imaging   CT CAP IMPRESSION: 1. Surgical changes left breast with borderline enlarged left axillary lymph node. No  other definite evidence for metastatic disease in the chest, abdomen, or pelvis. 2. Tiny bilateral pulmonary nodules. Attention on follow-up recommended. 3. Scattered small hypoattenuating lesions in the  liver parenchyma. Some of these have attenuation higher than would be expected for simple fluid and may be cysts complicated by proteinaceous debris or hemorrhage. MRI without and with contrast recommended to further evaluate. 4. Small to moderate hiatal hernia. 5. Aortic Atherosclerosis (ICD10-I70.0).   04/08/2019 -  Chemotherapy   chemo AC q2weeks for 4 cycles starting 04/08/19-05/20/19 followed by Taxol weekly for 12 weeks starting 06/09/19.        CURRENT THERAPY:  chemo AC q2weeks for 4 cycles starting 04/08/19-12/9/20followed by Taxolweekly starting 06/09/19 for 12 weeks.added Granix with Taxol C6.   INTERVAL HISTORY:  Cindy [name] is here for a follow up and treatment. She presents to the clinic alone. She notes on her 2 weeks off Taxol she did feel better and feels her best today. She denies any new changes.     REVIEW OF SYSTEMS:   Constitutional: Denies fevers, chills or abnormal weight loss Eyes: Denies blurriness of vision Ears, nose, mouth, throat, and face: Denies mucositis or sore throat Respiratory: Denies cough, dyspnea or wheezes Cardiovascular: Denies palpitation, chest discomfort or lower extremity swelling Gastrointestinal:  Denies nausea, heartburn or change in bowel habits Skin: Denies abnormal skin rashes Lymphatics: Denies new lymphadenopathy or easy bruising Neurological:Denies numbness, tingling or new weaknesses Behavioral/Psych: Mood is stable, no new changes  All other systems were reviewed with the patient and are negative.  MEDICAL HISTORY:  Past Medical History:  Diagnosis Date  . Allergy   . Depression    ? if bipolar works the best  . Elevated fasting blood sugar    hx  . H/O measles   . Headache   . History of chicken pox   . Hx of migraines   . Microscopic hematuria    advised to see urology ? never went  . Preventative health care 06/20/2011    SURGICAL HISTORY: Past Surgical History:  Procedure Laterality Date  .  BREAST LUMPECTOMY WITH RADIOACTIVE SEED AND SENTINEL LYMPH NODE BIOPSY Left 03/10/2019   Procedure: LEFT BREAST LUMPECTOMY WITH RADIOACTIVE SEED AND LEFT SENTINEL LYMPH NODE MAPPING;  Surgeon: Erroll Luna, MD;  Location: [facility];  Service: General;  Laterality: Left;  . endometrial polyps     2 surgeries by Dr Cathlean Cower  . EYE SURGERY  04/29/2009   Rt eye tighten up  . PORTACATH PLACEMENT N/A 03/10/2019   Procedure: INSERTION PORT-A-CATH WITH ULTRASOUND;  Surgeon: Erroll Luna, MD;  Location: [place];  Service: General;  Laterality: N/A;  . steel plate in skull Left    from a crushed skull    I have reviewed the social history and family history with the patient and they are unchanged from previous note.  ALLERGIES:  is allergic to bromfed and venlafaxine.  MEDICATIONS:  Current Outpatient Medications  Medication Sig Dispense Refill  . FLUoxetine (PROZAC) 40 MG capsule Take 1 capsule (40 mg total) by mouth daily. 90 capsule 2  . lidocaine-prilocaine (EMLA) cream Apply to affected area once 30 g 3  . ondansetron (ZOFRAN) 8 MG tablet Take 1 tablet (8 mg total) by mouth 2 (two) times daily as needed. Start on the third day after chemotherapy. 30 tablet 2  . prochlorperazine (COMPAZINE) 10 MG tablet Take 1 tablet (10 mg total) by mouth every 6 (six) hours as needed (Nausea or vomiting). 30 tablet 2  No current facility-administered medications for this visit.    PHYSICAL EXAMINATION: ECOG PERFORMANCE STATUS: 1 - Symptomatic but completely ambulatory  Vitals:   09/14/19 0834  BP: 119/68  Pulse: (!) 101  Resp: 18  Temp: 97.9 F (36.6 C)  SpO2: 98%   Filed Weights   09/14/19 0834  Weight: 149 lb 9.6 oz (67.9 kg)    Due to COVID19 we will limit examination to appearance. Patient had no complaints.  GENERAL:alert, no distress and comfortable SKIN: skin color normal, no rashes or significant lesions EYES: normal, Conjunctiva are pink and non-injected, sclera clear  NEURO:  alert & oriented x 3 with fluent speech   LABORATORY DATA:  I have reviewed the data as listed CBC Latest Ref Rng & Units 09/14/2019 09/09/2019 09/02/2019  WBC 4.0 - 10.5 K/uL 3.6(L) 1.9(L) 1.9(L)  Hemoglobin 12.0 - 15.0 g/dL 11.0(L) 10.3(L) 10.4(L)  Hematocrit 36.0 - 46.0 % 32.9(L) 30.5(L) 31.1(L)  Platelets 150 - 400 K/uL 221 211 216     CMP Latest Ref Rng & Units 09/09/2019 09/02/2019 08/26/2019  Glucose 70 - 99 mg/dL 100(H) 99 98  BUN 8 - 23 mg/dL 13 18 13   Creatinine 0.44 - 1.00 mg/dL 0.78 0.82 0.78  Sodium 135 - 145 mmol/L 142 139 140  Potassium 3.5 - 5.1 mmol/L 4.1 4.1 4.2  Chloride 98 - 111 mmol/L 107 105 106  CO2 22 - 32 mmol/L 25 27 26   Calcium 8.9 - 10.3 mg/dL 8.8(L) 9.3 9.2  Total Protein 6.5 - 8.1 g/dL 6.1(L) 6.2(L) 6.3(L)  Total Bilirubin 0.3 - 1.2 mg/dL 0.3 0.4 0.4  Alkaline Phos 38 - 126 U/L 55 52 53  AST 15 - 41 U/L 18 15 17   ALT 0 - 44 U/L 21 18 18       RADIOGRAPHIC STUDIES: I have personally reviewed the radiological images as listed and agreed with the findings in the report. No results found.   ASSESSMENT & PLAN:  DELAYNIE STETZER is a 69 y.o. female with    1.Malignant neoplasm of upper-inner quadrant of left breast, StageIIA,p(T2N0M0), ER/PR/HER2-,triple negative,GradeIII -She was diagnosed in 02/2019. She underwent left breast lumpectomy with SLNB on 03/10/19. -Given her aggressive triple negative breast cancer,she was treated with adjuvantchemoAC q2weeks for 4 cycles. She is currently receivingTaxolweekly for 12 weeks since 06/09/19. Dose for C3-5 due to neutropenia. Granix was added with C6.  -Her Taxol was held since 09/02/19 due to neutropenia. She felt better on her week off.  -Labs reviewed, WBC 3.6, Hg 11. ANC 2.1. Overall adequate to proceed with last cycle Taxol today.  -She will return to Dr. Sondra Come in 2-3 weeks to discuss start of Radiation. I will refer her to Dr. Brantley Stage to have PAC removed afterradiation -I checked with our  clinical trial options and she is not a candidate for our current open clinical trails.  -We also discussed the breast cancer surveillance after treatments. She will continue annual screening mammogram, self exam, and a routine office visit with lab and exam with [place]. -She is fine to proceed with COVID19 vaccine in 3-4 weeks.  -F/u in 8-9 weeks.    2. Neutropenia  -Secondary to Taxol.She has required Taxol dose reduction to weekly and holding chemo for 1 week.  -Granix was added with C6 taxol.   3. Genetic Testing  -Her mother had colon cancer and father had bladder cancer, her MGF had cancer and her maternal great grandmother had breast cancer.  -I will check with Genetics to see  if she is eligible for testing. She is interested ifinsurance covers it.  4.Anemia -Secondary to chemo. -I encouraged her to start multivitamin. -Improvedwith blood transfusion on 12/23 -Mild and stable on Taxol.Has improved on her week off. Should resolve after she completes chemo.   5. Acid reflux, Hiatal hernia   -Does cause her to cough.  -ContinuePrilosec OTC.  -She also has hiatal hernia which leads to mild cough and nausea. Partly managed with tums.   6. Mild Tachycardia  -She has been having mildly elevated heart rate in clinic lately. She denies palpitations or chest pain but notes very slight shortness of breath -I encouraged her to monitor her heart rate at home.    PLAN: -Labs reviewed and adequate to proceed with last dose adjuvantTaxol today at 52m/m2 -Proceed with Radiation soon  -CT CAP on 09/16/19, will call her after CT  -lab and f/u in 8-9 weeks    No problem-specific Assessment & Plan notes found for this encounter.   No orders of the defined types were placed in this encounter.  All questions were answered. The patient knows to call the clinic with any problems, questions or concerns. No barriers to learning was detected. The total time spent in the appointment  was 30 minutes.     YTruitt Merle MD 09/14/2019   I, AJoslyn Devon am acting as scribe for YTruitt Merle MD.   I have reviewed the above documentation for accuracy and completeness, and I agree with the above.

## 2019-09-10 NOTE — Telephone Encounter (Signed)
Scheduled appt per 3/31 sch msg - pt aware of appt date and time

## 2019-09-11 ENCOUNTER — Other Ambulatory Visit: Payer: PPO

## 2019-09-11 ENCOUNTER — Ambulatory Visit: Payer: PPO

## 2019-09-11 ENCOUNTER — Ambulatory Visit: Payer: PPO | Admitting: Hematology

## 2019-09-14 ENCOUNTER — Other Ambulatory Visit: Payer: Self-pay

## 2019-09-14 ENCOUNTER — Inpatient Hospital Stay: Payer: PPO

## 2019-09-14 ENCOUNTER — Inpatient Hospital Stay (HOSPITAL_BASED_OUTPATIENT_CLINIC_OR_DEPARTMENT_OTHER): Payer: PPO | Admitting: Hematology

## 2019-09-14 ENCOUNTER — Encounter: Payer: Self-pay | Admitting: *Deleted

## 2019-09-14 ENCOUNTER — Inpatient Hospital Stay: Payer: PPO | Attending: Hematology

## 2019-09-14 ENCOUNTER — Encounter: Payer: Self-pay | Admitting: Hematology

## 2019-09-14 VITALS — HR 87

## 2019-09-14 VITALS — BP 119/68 | HR 101 | Temp 97.9°F | Resp 18 | Ht 64.0 in | Wt 149.6 lb

## 2019-09-14 DIAGNOSIS — Z95828 Presence of other vascular implants and grafts: Secondary | ICD-10-CM

## 2019-09-14 DIAGNOSIS — Z171 Estrogen receptor negative status [ER-]: Secondary | ICD-10-CM | POA: Diagnosis not present

## 2019-09-14 DIAGNOSIS — K449 Diaphragmatic hernia without obstruction or gangrene: Secondary | ICD-10-CM | POA: Insufficient documentation

## 2019-09-14 DIAGNOSIS — K219 Gastro-esophageal reflux disease without esophagitis: Secondary | ICD-10-CM | POA: Insufficient documentation

## 2019-09-14 DIAGNOSIS — Z5111 Encounter for antineoplastic chemotherapy: Secondary | ICD-10-CM | POA: Insufficient documentation

## 2019-09-14 DIAGNOSIS — C50212 Malignant neoplasm of upper-inner quadrant of left female breast: Secondary | ICD-10-CM

## 2019-09-14 DIAGNOSIS — R000 Tachycardia, unspecified: Secondary | ICD-10-CM | POA: Insufficient documentation

## 2019-09-14 DIAGNOSIS — D6481 Anemia due to antineoplastic chemotherapy: Secondary | ICD-10-CM | POA: Insufficient documentation

## 2019-09-14 DIAGNOSIS — D701 Agranulocytosis secondary to cancer chemotherapy: Secondary | ICD-10-CM | POA: Insufficient documentation

## 2019-09-14 LAB — CMP (CANCER CENTER ONLY)
ALT: 19 U/L (ref 0–44)
AST: 16 U/L (ref 15–41)
Albumin: 3.7 g/dL (ref 3.5–5.0)
Alkaline Phosphatase: 60 U/L (ref 38–126)
Anion gap: 10 (ref 5–15)
BUN: 17 mg/dL (ref 8–23)
CO2: 23 mmol/L (ref 22–32)
Calcium: 9.1 mg/dL (ref 8.9–10.3)
Chloride: 107 mmol/L (ref 98–111)
Creatinine: 0.77 mg/dL (ref 0.44–1.00)
GFR, Est AFR Am: >60 mL/min (ref >60)
GFR, Estimated: >60 mL/min (ref >60)
Glucose, Bld: 105 mg/dL — ABNORMAL HIGH (ref 70–99)
Potassium: 3.9 mmol/L (ref 3.5–5.1)
Sodium: 140 mmol/L (ref 135–145)
Total Bilirubin: 0.4 mg/dL (ref 0.3–1.2)
Total Protein: 6.3 g/dL — ABNORMAL LOW (ref 6.5–8.1)

## 2019-09-14 LAB — CBC WITH DIFFERENTIAL (CANCER CENTER ONLY)
Abs Immature Granulocytes: 0.03 10*3/uL (ref 0.00–0.07)
Basophils Absolute: 0 10*3/uL (ref 0.0–0.1)
Basophils Relative: 1 %
Eosinophils Absolute: 0.1 10*3/uL (ref 0.0–0.5)
Eosinophils Relative: 2 %
HCT: 32.9 % — ABNORMAL LOW (ref 36.0–46.0)
Hemoglobin: 11 g/dL — ABNORMAL LOW (ref 12.0–15.0)
Immature Granulocytes: 1 %
Lymphocytes Relative: 26 %
Lymphs Abs: 0.9 10*3/uL (ref 0.7–4.0)
MCH: 32.8 pg (ref 26.0–34.0)
MCHC: 33.4 g/dL (ref 30.0–36.0)
MCV: 98.2 fL (ref 80.0–100.0)
Monocytes Absolute: 0.4 10*3/uL (ref 0.1–1.0)
Monocytes Relative: 11 %
Neutro Abs: 2.1 10*3/uL (ref 1.7–7.7)
Neutrophils Relative %: 59 %
Platelet Count: 221 10*3/uL (ref 150–400)
RBC: 3.35 MIL/uL — ABNORMAL LOW (ref 3.87–5.11)
RDW: 14.7 % (ref 11.5–15.5)
WBC Count: 3.6 10*3/uL — ABNORMAL LOW (ref 4.0–10.5)
nRBC: 0 % (ref 0.0–0.2)

## 2019-09-14 MED ORDER — SODIUM CHLORIDE 0.9% FLUSH
10.0000 mL | Freq: Once | INTRAVENOUS | Status: AC
  Administered 2019-09-14: 10 mL
  Filled 2019-09-14: qty 10

## 2019-09-14 MED ORDER — FAMOTIDINE IN NACL 20-0.9 MG/50ML-% IV SOLN
INTRAVENOUS | Status: AC
  Filled 2019-09-14: qty 50

## 2019-09-14 MED ORDER — SODIUM CHLORIDE 0.9 % IV SOLN
Freq: Once | INTRAVENOUS | Status: AC
  Filled 2019-09-14: qty 250

## 2019-09-14 MED ORDER — SODIUM CHLORIDE 0.9 % IV SOLN
70.0000 mg/m2 | Freq: Once | INTRAVENOUS | Status: AC
  Administered 2019-09-14: 126 mg via INTRAVENOUS
  Filled 2019-09-14: qty 21

## 2019-09-14 MED ORDER — FAMOTIDINE IN NACL 20-0.9 MG/50ML-% IV SOLN
20.0000 mg | Freq: Once | INTRAVENOUS | Status: AC
  Administered 2019-09-14: 20 mg via INTRAVENOUS

## 2019-09-14 MED ORDER — SODIUM CHLORIDE 0.9% FLUSH
10.0000 mL | INTRAVENOUS | Status: DC | PRN
  Administered 2019-09-14: 10 mL
  Filled 2019-09-14: qty 10

## 2019-09-14 MED ORDER — LORATADINE 10 MG PO TABS
ORAL_TABLET | ORAL | Status: AC
  Filled 2019-09-14: qty 1

## 2019-09-14 MED ORDER — HEPARIN SOD (PORK) LOCK FLUSH 100 UNIT/ML IV SOLN
500.0000 [IU] | Freq: Once | INTRAVENOUS | Status: AC | PRN
  Administered 2019-09-14: 500 [IU]
  Filled 2019-09-14: qty 5

## 2019-09-14 MED ORDER — LORATADINE 10 MG PO TABS
10.0000 mg | ORAL_TABLET | Freq: Every day | ORAL | Status: DC
  Administered 2019-09-14: 10 mg via ORAL

## 2019-09-14 MED ORDER — SODIUM CHLORIDE 0.9 % IV SOLN
20.0000 mg | Freq: Once | INTRAVENOUS | Status: AC
  Administered 2019-09-14: 20 mg via INTRAVENOUS
  Filled 2019-09-14: qty 20

## 2019-09-14 NOTE — Patient Instructions (Signed)
[facility] Discharge Instructions for Patients Receiving Chemotherapy  Today you received the following chemotherapy agents:  Taxol.  To help prevent nausea and vomiting after your treatment, we encourage you to take your nausea medication as directed.   If you develop nausea and vomiting that is not controlled by your nausea medication, call the clinic.   BELOW ARE SYMPTOMS THAT SHOULD BE REPORTED IMMEDIATELY:  *FEVER GREATER THAN 100.5 F  *CHILLS WITH OR WITHOUT FEVER  NAUSEA AND VOMITING THAT IS NOT CONTROLLED WITH YOUR NAUSEA MEDICATION  *UNUSUAL SHORTNESS OF BREATH  *UNUSUAL BRUISING OR BLEEDING  TENDERNESS IN MOUTH AND THROAT WITH OR WITHOUT PRESENCE OF ULCERS  *URINARY PROBLEMS  *BOWEL PROBLEMS  UNUSUAL RASH Items with * indicate a potential emergency and should be followed up as soon as possible.  Feel free to call the clinic should you have any questions or concerns. The clinic phone number is [phone].  Please show the CHEMO ALERT CARD at check-in to the Emergency Department and triage nurse.   

## 2019-09-16 ENCOUNTER — Ambulatory Visit (HOSPITAL_COMMUNITY)
Admission: RE | Admit: 2019-09-16 | Discharge: 2019-09-16 | Disposition: A | Discharge status: Home / Self Care | Payer: PPO | Source: Ambulatory Visit | Attending: Nurse Practitioner | Admitting: Nurse Practitioner

## 2019-09-16 ENCOUNTER — Other Ambulatory Visit: Payer: Self-pay

## 2019-09-16 ENCOUNTER — Telehealth: Payer: Self-pay | Admitting: Hematology

## 2019-09-16 DIAGNOSIS — C50212 Malignant neoplasm of upper-inner quadrant of left female breast: Secondary | ICD-10-CM | POA: Diagnosis not present

## 2019-09-16 DIAGNOSIS — Z171 Estrogen receptor negative status [ER-]: Secondary | ICD-10-CM | POA: Diagnosis not present

## 2019-09-16 DIAGNOSIS — C50912 Malignant neoplasm of unspecified site of left female breast: Secondary | ICD-10-CM | POA: Diagnosis not present

## 2019-09-16 DIAGNOSIS — K449 Diaphragmatic hernia without obstruction or gangrene: Secondary | ICD-10-CM | POA: Diagnosis not present

## 2019-09-16 MED ORDER — IOHEXOL 300 MG/ML  SOLN
100.0000 mL | Freq: Once | INTRAMUSCULAR | Status: AC | PRN
  Administered 2019-09-16: 13:00:00 100 mL via INTRAVENOUS

## 2019-09-16 MED ORDER — SODIUM CHLORIDE (PF) 0.9 % IJ SOLN
INTRAMUSCULAR | Status: AC
  Filled 2019-09-16: qty 50

## 2019-09-16 NOTE — Telephone Encounter (Signed)
Scheduled appts per 4/5 los. Pt confirmed new appt date and times.

## 2019-09-21 ENCOUNTER — Telehealth: Payer: Self-pay | Admitting: Nurse Practitioner

## 2019-09-21 NOTE — Telephone Encounter (Signed)
I reviewed and discussed her CT CAP from 09/16/19 which shows no evidence of disease in chest, abdomen, or pelvis. The previously seen left axillary and subpectoral nodes have resolved, were likely reactive to surgery. The tiny pulmonary nodules are unchanged, as are nodules in the liver which are likely cysts. I also reviewed the result with Dr. Burr Medico who agrees with the interpretation. Cindy [name] asked me about the aortic atherosclerosis; she had normal lipid panel in 03/2019 which she plans to repeat in 03/2020 per PCP, she is not on statin. Further risk stratification pending labs.   She will see Dr. Brantley Stage on 4/13 and will discuss PAC removal. She will see Dr. Sondra Come next week to proceed with RT. We will see her back 11/16/19.   She understands the above and appreciates the call.   Cindy Rue, NP

## 2019-09-22 ENCOUNTER — Ambulatory Visit: Payer: Self-pay | Admitting: Surgery

## 2019-09-22 DIAGNOSIS — C50912 Malignant neoplasm of unspecified site of left female breast: Secondary | ICD-10-CM | POA: Diagnosis not present

## 2019-09-22 NOTE — H&P (Signed)
Junius Creamer Documented: 09/22/2019 1:41 PM Location: [facility] Patient #: [id number] DOB: 01/09/1951 Single / Language: [name] / Race: White Female  History of Present Illness Marcello Moores A. Tannen Vandezande MD; 09/22/2019 4:59 PM) Patient words: Patient returns for follow-up after breast conserving surgery of her left breast and subsequent postoperative chemotherapy. She is done with chemotherapy. She will begin radiation therapy soon. She is weak but overall has done well with chemotherapy.  The patient is a 69 year old female.   Allergies (Chanel Teressa Senter, CMA; 09/22/2019 1:41 PM) Bromfed *COUGH/COLD/ALLERGY* Shortness of breath. Venlafaxine HCl *ANTIDEPRESSANTS* Does not remember reaction Allergies Reconciled  Medication History (Chanel Teressa Senter, CMA; 09/22/2019 1:41 PM) FLUoxetine HCl (40MG  Capsule, Oral) Active. Xanax (0.25MG  Tablet, Oral) Active. Medications Reconciled    Vitals (Chanel Nolan CMA; 09/22/2019 1:41 PM) 09/22/2019 1:41 PM Weight: 146 lb Height: 64in Body Surface Area: 1.71 m Body Mass Index: 25.06 kg/m  Temp.: 97.68F  Pulse: 122 (Regular)  BP: 124/74 (Sitting, Left Arm, Standard)        Physical Exam (Oriana Horiuchi A. Gerturde Kuba MD; 09/22/2019 4:59 PM)  General Mental Status-Alert. General Appearance-Consistent with stated age. Hydration-Well hydrated. Voice-Normal.  Breast Note: Left breast shows postoperative change without mass lesion. Incisions are healed. Right upper chest port noted.  Neurologic Neurologic evaluation reveals -alert and oriented x 3 with no impairment of recent or remote memory. Mental Status-Normal.  Lymphatic Axillary  General Axillary Region: Bilateral - Description - Normal. Tenderness - Non Tender.    Assessment & Plan (Sayuri Rhames A. Yanelis Osika MD; 09/22/2019 5:00 PM)  BREAST CANCER, LEFT (C50.912) Impression: port removal in june doing well  Total time 20 minutes for examination, review of  records, discussion of treatment options, discussion of port removal and surgical complications as well as alternatives to surgery and timing  Current Plans Pt Education - [employer] Free Text Education/Instructions: discussed with patient and provided information. I recommended surgery to remove the catheter. I explained the technique of removal with use of local anesthesia & possible need for more aggressive sedation/anesthesia for patient comfort.  Risks such as bleeding, infection, and other risks were discussed. Post-operative dressing/incision care was discussed. I noted a good likelihood this will help address the problem. We will work to minimize complications. Questions were answered. The patient expresses understanding & wishes to proceed with surgery.

## 2019-09-29 NOTE — Progress Notes (Signed)
Radiation Oncology         [phone]) [phone] ________________________________  Name: Cindy [name] MRN: [id number]  Date: 09/30/2019  DOB: March 20, 1951  Re-Evaluation Note  CC: Mosie Lukes, MD  Truitt Merle, MD    ICD-10-CM   1. Malignant neoplasm of upper-inner quadrant of left breast in female, estrogen receptor negative ([place])  C50.212    Z17.1     Diagnosis: Stage pT2, pN0 Left Breast UIQ, Invasive Ductal Carcinoma, ER- / PR- / Her2-, Grade 3  Narrative: The patient returns today to discuss radiation treatment options. She was seen in the multidisciplinary breast clinic on 02/18/2019. At that time, it was recommended that the patient proceed with left lumpectomy with sentinel lymph node biopsy and port placement, adjuvant chemotherapy, and adjuvant radiation therapy.  The patient underwent left breast lumpectomy with left sentinel lymph node biopsy on 03/10/2019 performed by Dr. Brantley Stage. Pathology from the procedure showed grade 3 invasive ductal carcinoma that was 0.3 cm from the superior margin. Medial resection edge was 0.1 cm from carcinoma. No lymphovascular or perineural invasion was present. Left medial margin and left axillary sentinel lymph node were both negative for carcinoma.  Of note, the patient had a PAP smear on 03/19/2019 that was negative.  Echocardiogram on 03/26/2019 showed an EF of 50-55%. For this, the patient was referred to Dr. Haroldine Laws. She was seen on 04/01/2019, during which time Dr. Haroldine Laws assured the patient that her EF and doppler parameters were normal and her echocardiogram was misinterpreted.  CT of chest/abdomen/pelvis on 04/06/2019 showed surgical changes in the left breast with borderline enlarge left axillary lymph node. There was no other definite evidence for metastatic disease in the chest, abdomen, or pelvis. Of note, there were also tiny bilateral pulmonary nodules and scattered small hypo-attenuating lesions in the liver parenchyma, some of  which had attenuation high than would be expected for simple fluid and may have been cysts complicated by proteinaceous debris or hemorrhage.  Bone scan on 04/06/2019 showed a mild focus of uptake involving the anterior portion of the left upper rib, but there was no corresponding abnormality seen on the CT scan done on the same day. It may potentially have represented degenerative change or be secondary to overlying postsurgical change. There was no definite scintigraphic evidence of osseous metastases.  The patient began adjuvant Chemotherapy with [facility] q2weeks for 4 cycles started on 04/08/2019 and completed on 05/20/2019. under the care of Dr. Burr Medico. This was followed by Taxol weekly for 12 weeks that was started on 06/09/2019. Granix was added at cycle #6.  Echocardiogram on 05/15/2019 showed an EF of 65-70%.  Taxol was held on 09/02/2019 secondary to neutropenia. She felt better with a week off. Labs were ordered and were adequate, so the patient proceeded with her last cycle of Taxol on 09/14/2019.  CT of chest/abdomen/pelvis on 09/16/2019 showed no evidence for metastatic disease in the chest, abdomen, or pelvis. There was no change in the tiny bilateral pulmonary nodules, likely benign. There was a stable appearance of the multiple hypo-attenuating liver lesions, none of which showed suspicious enhancement and were considered to probably be a combination of simple and complex cysts. There were several tiny liver lesions that were too small to characterize.   On review of systems, the patient reports still feeling fatigued from chemotherapy  . She denies breast pain, lymphedema, and any other symptoms.    Allergies:  is allergic to bromfed and venlafaxine.  Meds: Current Outpatient Medications  Medication Sig  Dispense Refill  . FLUoxetine (PROZAC) 40 MG capsule Take 1 capsule (40 mg total) by mouth daily. 90 capsule 2  . lidocaine-prilocaine (EMLA) cream Apply to affected area once 30 g 3    . ondansetron (ZOFRAN) 8 MG tablet Take 1 tablet (8 mg total) by mouth 2 (two) times daily as needed. Start on the third day after chemotherapy. (Patient not taking: Reported on 09/30/2019) 30 tablet 2  . prochlorperazine (COMPAZINE) 10 MG tablet Take 1 tablet (10 mg total) by mouth every 6 (six) hours as needed (Nausea or vomiting). (Patient not taking: Reported on 09/30/2019) 30 tablet 2   No current facility-administered medications for this encounter.    Physical Findings: The patient is in no acute distress. Patient is alert and oriented.  height is _0  (1.626 m) and weight is 146 lb 6.4 oz (66.4 kg). Her temperature is 98 F (36.7 C). Her blood pressure is 96/68 and her pulse is 101 (abnormal). Her respiration is 20 and oxygen saturation is 98%.  No significant changes. Lungs are clear to auscultation bilaterally. Heart has regular rate and rhythm. No palpable cervical, supraclavicular, or axillary adenopathy. Abdomen soft, non-tender, normal bowel sounds. Right breast: no palpable mass, nipple discharge or bleeding. Left breast: Scar well-healed in the upper inner quadrant.  Axillary scar well-healed. no palpable or visible signs of recurrence.  No nipple discharge or bleeding  Lab Findings: Lab Results  Component Value Date   WBC 3.6 (L) 09/14/2019   HGB 11.0 (L) 09/14/2019   HCT 32.9 (L) 09/14/2019   MCV 98.2 09/14/2019   PLT 221 09/14/2019    Radiographic Findings: CT Abdomen Pelvis W Wo Contrast  Result Date: 09/16/2019 CLINICAL DATA:  Left-sided breast cancer with previous left axillary and subpectoral lymphadenopathy. EXAM: CT CHEST WITH CONTRAST CT ABDOMEN AND PELVIS WITH AND WITHOUT CONTRAST TECHNIQUE: Multidetector CT imaging of the chest was performed during intravenous contrast administration. Multidetector CT imaging of the abdomen and pelvis was performed following the standard protocol before and during bolus administration of intravenous contrast. CONTRAST:  16m  OMNIPAQUE IOHEXOL 300 MG/ML  SOLN COMPARISON:  04/06/2019 FINDINGS: CT CHEST FINDINGS Cardiovascular: The heart size is normal. No substantial pericardial effusion. No thoracic aortic aneurysm. Right Port-A-Cath tip is positioned in the mid SVC. Mediastinum/Nodes: No mediastinal lymphadenopathy. There is no hilar lymphadenopathy. The esophagus has normal imaging features. Small hiatal hernia noted. There is no axillary lymphadenopathy. The 9 mm short axis left axillary node seen on the previous study has resolved in the interval. No evidence for subpectoral lymphadenopathy. Surgical clips noted in the axilla bilaterally. Lungs/Pleura: 2 mm anterior right upper lobe paramediastinal nodule is stable in the interval (34/6). The 2 mm left lower lobe nodule (94/6) is unchanged. No suspicious pulmonary nodule or mass. No focal airspace consolidation. No pleural effusion. Musculoskeletal: No worrisome lytic or sclerotic osseous abnormality. CT ABDOMEN AND PELVIS FINDINGS Hepatobiliary: Similar appearance of the multiple hypoattenuating liver lesions. 1 of the more dominant lesions is seen anteriorly in segment 4 measuring 10 mm today, stable compared to 10 mm previously. Several tiny 3-4 mm hypoattenuating lesions in the liver are too small to characterize. There is no evidence for gallstones, gallbladder wall thickening, or pericholecystic fluid. No intrahepatic or extrahepatic biliary dilation. Pancreas: No focal mass lesion. No dilatation of the main duct. No intraparenchymal cyst. No peripancreatic edema. Spleen: No splenomegaly. No focal mass lesion. Adrenals/Urinary Tract: No adrenal nodule or mass. Right kidney unremarkable. Tiny hypoattenuating lesions in the left  kidney are too small to characterize but likely benign and unchanged in the interval. No evidence for hydroureter. The urinary bladder appears normal for the degree of distention. Stomach/Bowel: Small hiatal hernia. Stomach otherwise unremarkable.  Duodenum is normally positioned as is the ligament of Treitz. No small bowel wall thickening. No small bowel dilatation. The terminal ileum is normal. The appendix is not visualized, but there is no edema or inflammation in the region of the cecum. No gross colonic mass. No colonic wall thickening. Vascular/Lymphatic: There is abdominal aortic atherosclerosis without aneurysm. There is no gastrohepatic or hepatoduodenal ligament lymphadenopathy. No retroperitoneal or mesenteric lymphadenopathy. No pelvic sidewall lymphadenopathy. Reproductive: Unremarkable. Other: No intraperitoneal free fluid. Musculoskeletal: No worrisome lytic or sclerotic osseous abnormality. IMPRESSION: 1. No evidence for metastatic disease in the chest, abdomen, or pelvis. 2. No change in the tiny bilateral pulmonary nodules, likely benign. Attention on follow-up suggested. 3. Stable appearance of multiple hypoattenuating liver lesions. None of these lesions show suspicious enhancement on today's study in there probably a combination of simple and complex cysts. There are several tiny liver lesions too small to characterize on today's study. Continued attention on follow-up recommended. 4. Small hiatal hernia. 5.  Aortic Atherosclerois (ICD10-170.0) Electronically Signed   By: Misty Stanley M.D.   On: 09/16/2019 15:57   CT Chest W Contrast  Result Date: 09/16/2019 CLINICAL DATA:  Left-sided breast cancer with previous left axillary and subpectoral lymphadenopathy. EXAM: CT CHEST WITH CONTRAST CT ABDOMEN AND PELVIS WITH AND WITHOUT CONTRAST TECHNIQUE: Multidetector CT imaging of the chest was performed during intravenous contrast administration. Multidetector CT imaging of the abdomen and pelvis was performed following the standard protocol before and during bolus administration of intravenous contrast. CONTRAST:  167m OMNIPAQUE IOHEXOL 300 MG/ML  SOLN COMPARISON:  04/06/2019 FINDINGS: CT CHEST FINDINGS Cardiovascular: The heart size is  normal. No substantial pericardial effusion. No thoracic aortic aneurysm. Right Port-A-Cath tip is positioned in the mid SVC. Mediastinum/Nodes: No mediastinal lymphadenopathy. There is no hilar lymphadenopathy. The esophagus has normal imaging features. Small hiatal hernia noted. There is no axillary lymphadenopathy. The 9 mm short axis left axillary node seen on the previous study has resolved in the interval. No evidence for subpectoral lymphadenopathy. Surgical clips noted in the axilla bilaterally. Lungs/Pleura: 2 mm anterior right upper lobe paramediastinal nodule is stable in the interval (34/6). The 2 mm left lower lobe nodule (94/6) is unchanged. No suspicious pulmonary nodule or mass. No focal airspace consolidation. No pleural effusion. Musculoskeletal: No worrisome lytic or sclerotic osseous abnormality. CT ABDOMEN AND PELVIS FINDINGS Hepatobiliary: Similar appearance of the multiple hypoattenuating liver lesions. 1 of the more dominant lesions is seen anteriorly in segment 4 measuring 10 mm today, stable compared to 10 mm previously. Several tiny 3-4 mm hypoattenuating lesions in the liver are too small to characterize. There is no evidence for gallstones, gallbladder wall thickening, or pericholecystic fluid. No intrahepatic or extrahepatic biliary dilation. Pancreas: No focal mass lesion. No dilatation of the main duct. No intraparenchymal cyst. No peripancreatic edema. Spleen: No splenomegaly. No focal mass lesion. Adrenals/Urinary Tract: No adrenal nodule or mass. Right kidney unremarkable. Tiny hypoattenuating lesions in the left kidney are too small to characterize but likely benign and unchanged in the interval. No evidence for hydroureter. The urinary bladder appears normal for the degree of distention. Stomach/Bowel: Small hiatal hernia. Stomach otherwise unremarkable. Duodenum is normally positioned as is the ligament of Treitz. No small bowel wall thickening. No small bowel dilatation. The  terminal ileum  is normal. The appendix is not visualized, but there is no edema or inflammation in the region of the cecum. No gross colonic mass. No colonic wall thickening. Vascular/Lymphatic: There is abdominal aortic atherosclerosis without aneurysm. There is no gastrohepatic or hepatoduodenal ligament lymphadenopathy. No retroperitoneal or mesenteric lymphadenopathy. No pelvic sidewall lymphadenopathy. Reproductive: Unremarkable. Other: No intraperitoneal free fluid. Musculoskeletal: No worrisome lytic or sclerotic osseous abnormality. IMPRESSION: 1. No evidence for metastatic disease in the chest, abdomen, or pelvis. 2. No change in the tiny bilateral pulmonary nodules, likely benign. Attention on follow-up suggested. 3. Stable appearance of multiple hypoattenuating liver lesions. None of these lesions show suspicious enhancement on today's study in there probably a combination of simple and complex cysts. There are several tiny liver lesions too small to characterize on today's study. Continued attention on follow-up recommended. 4. Small hiatal hernia. 5.  Aortic Atherosclerois (ICD10-170.0) Electronically Signed   By: Misty Stanley M.D.   On: 09/16/2019 15:57    Impression: Stage pT2, pN0 Left Breast UIQ, Invasive Ductal Carcinoma, ER- / PR- / Her2-, Grade 3  Patient would be a good candidate for breast conservation with radiation therapy directed at the left breast.  I discussed the overall course of treatment side effects and potential toxicities of radiation therapy in the situation with the patient.  She appears to understand and wishes to proceed with planned course of treatment  Plan:  Patient is scheduled for CT simulation later today.  Treatments to begin next week.  She would be a good candidate for hypofractionated accelerated radiation therapy over approximately 4 weeks.  -----------------------------------  Blair Promise, PhD, MD  This document serves as a record of services  personally performed by Gery Pray, MD. It was created on his behalf by Clerance Lav, a trained medical scribe. The creation of this record is based on the scribe's personal observations and the provider's statements to them. This document has been checked and approved by the attending provider.

## 2019-09-29 NOTE — Progress Notes (Signed)
Location of Breast Cancer: Malignant neoplasm of upper-inner quadrant of left breast in female, estrogen receptor negative   Histology per Pathology Report: 03/10/19: DIAGNOSIS:   A. BREAST, LEFT, LUMPECTOMY:  - Invasive ductal carcinoma, grade 3, 2.5 cm  - Carcinoma is 0.3 cm from superior margin  - Medial resection edge is 0.1 cm from carcinoma (final medial margin is  represented by part 2 which is negative for carcinoma)  - Negative for lymphovascular or perineural invasion  - Biopsy site changes  - See oncology table   B. BREAST, LEFT MEDIAL MARGIN, EXCISION:  - Benign breast parenchyma, negative for carcinoma   C. LYMPH NODE, LEFT AXILLARY, SENTINEL BIOPSY:  - Lymph node, negative for carcinoma (0/1)   Receptor Status: ER(0%), PR (0%), Her2-neu (negative, 1+), Ki-(40%)  Did patient present with symptoms (if so, please note symptoms) or was this found on screening mammography?: She had routine screening mammography on 01/27/2019 showing a possible abnormality in the left breast. Patient reports she noticed pain in the breast approximately a week prior to her screening mammogram. She underwent left diagnostic mammography with tomography and ultrasonography at [facility] on 02/04/2019 showing: 1.7 cm upper-inner left breast mass; no abnormal left axillary lymph nodes.  Past/Anticipated interventions by surgeon, if any:  03/10/2019 Surgery   LEFT BREAST LUMPECTOMY WITH RADIOACTIVE SEED AND LEFT SENTINEL LYMPH NODE MAPPING and PAC placement  by Dr. Brantley Stage      Past/Anticipated interventions by medical oncology, if any: Chemotherapy Per Dr. Burr Medico 09/14/19: ASSESSMENT & PLAN:  Cindy [name] is a 69 y.o. female with    1.Malignant neoplasm of upper-inner quadrant of left breast, StageIIA,p(T2N0M0), ER/PR/HER2-,triple negative,GradeIII -She was diagnosed in 02/2019. She underwent left breast lumpectomy with SLNB on 03/10/19. -Given her aggressive triple negative breast  cancer,she was treated with adjuvantchemoAC q2weeks for 4 cycles. She is currently receivingTaxolweekly for 12 weeks since 06/09/19. Dose for C3-5 due to neutropenia. Granix was added with C6.  -Her Taxol was held since 09/02/19 due to neutropenia. She felt better on her week off.  -Labs reviewed, WBC 3.6, Hg 11. ANC 2.1. Overall adequate to proceed with last cycle Taxol today.  -She will return to Dr. Sondra Come in 2-3 weeks to discuss start of Radiation. I will refer her to Dr. Brantley Stage to have PAC removed afterradiation -I checked with our clinical trial options and she is not a candidate for our current open clinical trails.  -We also discussed the breast cancer surveillance after treatments. She will continue annual screening mammogram, self exam, and a routine office visit with lab and exam with [place]. -She is fine to proceed with COVID19 vaccine in 3-4 weeks.  -F/u in 8-9 weeks.    2. Neutropenia  -Secondary to Taxol.She has required Taxol dose reduction to weekly and holding chemo for 1 week.  -Granix was added with C6 taxol.   3. Genetic Testing  -Her mother had colon cancer and father had bladder cancer, her MGF had cancer and her maternal great grandmother had breast cancer.  -I will check with Genetics to see if she is eligible for testing. She is interested ifinsurance covers it.  4.Anemia -Secondary to chemo. -I encouraged her to start multivitamin. -Improvedwith blood transfusion on 12/23 -Mild and stable on Taxol.Has improved on her week off. Should resolve after she completes chemo.   5. Acid reflux, Hiatal hernia   -Does cause her to cough.  -ContinuePrilosec OTC.  -She also has hiatal hernia which leads to mild cough  and nausea. Partly managed with tums.   6. Mild Tachycardia  -She has been having mildly elevated heart rate in clinic lately. She denies palpitations or chest pain but notes very slight shortness of breath -I encouraged her to monitor her  heart rate at home.    PLAN: -Labs reviewed and adequate to proceed with last dose adjuvantTaxol today at 59m/m2 -Proceed with Radiation soon  -CT CAP on 09/16/19, will call her after CT  -lab and f/u in 8-9 weeks    Lymphedema issues, if any:  Pt denies s/s lymphedema. Pt with good ROM.   Pain issues, if any: Pt denies c/o pain.   SAFETY ISSUES:  Prior radiation? no  Pacemaker/ICD? no  Possible current pregnancy?no  Is the patient on methotrexate? no  Current Complaints / other details:  Pt presents today for consult with Dr. KSondra Comefor Radiation Oncology. Pt is unaccompanied.  BP 96/68 (BP Location: Right Arm, Patient Position: Sitting, Cuff Size: Normal)   Pulse (!) 101   Temp 98 F (36.7 C)   Resp 20   Ht 5' 4"  (1.626 m)   Wt 146 lb 6.4 oz (66.4 kg)   SpO2 98%   BMI 25.13 kg/m   Wt Readings from Last 3 Encounters:  09/30/19 146 lb 6.4 oz (66.4 kg)  09/14/19 149 lb 9.6 oz (67.9 kg)  08/26/19 148 lb 14.4 oz (67.5 kg)       JLoma Sousa RN 09/30/2019,12:41 PM

## 2019-09-29 NOTE — Progress Notes (Signed)
Radiation Oncology         [phone] ________________________________  Name: DERITA MICHELSEN MRN: [id number]  Date: 09/30/2019  DOB: 1950/10/30  SIMULATION AND TREATMENT PLANNING NOTE    ICD-10-CM   1. 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  Identity was confirmed.  All relevant records and images related to the planned course of therapy were reviewed.  The patient freely provided informed written consent to proceed with treatment after reviewing the details related to the planned course of therapy. The consent form was witnessed and verified by the simulation staff.  Then, the patient was set-up in a stable reproducible supine position for radiation therapy.  CT images were obtained.  Surface markings were placed.  The CT images were loaded into the planning software.  Then the target and avoidance structures were contoured.  Treatment planning then occurred.  The radiation prescription was entered and confirmed.  Then, I designed and supervised the construction of a total of 5 medically necessary complex treatment devices.  I have requested : 3D Simulation  I have requested a DVH of the following structures: lumpectomy cavity , heart, lungs.  I have ordered:dose calc.  PLAN:  The patient will receive 40.05 Gy in 15 fractions followed by a boost to the lumpectomy cavity of 10 Gy in 5 fractions.    Since intensity modulated radiotherapy (IMRT) and 3D conformal radiation treatment methods are predicated on accurate and precise positioning for treatment, intrafraction motion monitoring is medically necessary to ensure accurate and safe treatment delivery.  The ability to quantify intrafraction motion without excessive ionizing radiation  dose can only be performed with optical surface tracking. Accordingly, surface imaging offers the opportunity to obtain 3D measurements of patient position throughout IMRT and 3D treatments without excessive radiation exposure.  I am ordering optical surface tracking for this patients upcoming course of radiotherapy. ________________________________  Special treatment procedure   was performed today due to the extra time and effort required by myself to plan and prepare this patient for deep inspiration breath hold technique.  I have determined cardiac sparing to be of benefit to this patient to prevent long term cardiac damage due to radiation of the heart.  Bellows were placed on the patients abdomen. To facilitate cardiac sparing, the patient was coached by the radiation therapists on breath hold techniques and breathing practice was performed. Practice waveforms were obtained. The patient was then scanned while maintaining breath hold in the treatment position.  This image was then transferred over to the imaging specialist. The imaging specialist then created a fusion of the free breathing and breath hold scans using the chest wall as the stable structure. I personally reviewed the fusion in axial, coronal and sagittal image planes.  Excellent cardiac sparing was obtained.  I felt the patient is an appropriate candidate for breath hold and the patient will be treated as such.  The image fusion was then reviewed with the patient to reinforce the necessity of reproducible breath hold.   -----------------------------------  Blair Promise, PhD, MD  This document serves as a record of services personally performed by Gery Pray, MD. It was created on his behalf by Clerance Lav, a trained medical scribe. The creation of this record is based on the scribe's  personal observations and the provider's statements to them. This document has been checked and approved by the attending provider.

## 2019-09-30 ENCOUNTER — Ambulatory Visit
Admission: RE | Admit: 2019-09-30 | Discharge: 2019-09-30 | Disposition: A | Discharge status: Home / Self Care | Payer: PPO | Source: Ambulatory Visit | Attending: Radiation Oncology | Admitting: Radiation Oncology

## 2019-09-30 ENCOUNTER — Other Ambulatory Visit: Payer: Self-pay

## 2019-09-30 ENCOUNTER — Encounter: Payer: Self-pay | Admitting: *Deleted

## 2019-09-30 ENCOUNTER — Encounter: Payer: Self-pay | Admitting: Radiation Oncology

## 2019-09-30 VITALS — BP 96/68 | HR 101 | Temp 98.0°F | Resp 20 | Ht 64.0 in | Wt 146.4 lb

## 2019-09-30 DIAGNOSIS — C50212 Malignant neoplasm of upper-inner quadrant of left female breast: Secondary | ICD-10-CM

## 2019-09-30 DIAGNOSIS — I700 Atherosclerosis of aorta: Secondary | ICD-10-CM | POA: Insufficient documentation

## 2019-09-30 DIAGNOSIS — Z510 Encounter for antineoplastic radiation therapy: Secondary | ICD-10-CM | POA: Diagnosis not present

## 2019-09-30 DIAGNOSIS — Z171 Estrogen receptor negative status [ER-]: Secondary | ICD-10-CM | POA: Diagnosis not present

## 2019-09-30 DIAGNOSIS — Z79899 Other long term (current) drug therapy: Secondary | ICD-10-CM | POA: Diagnosis not present

## 2019-09-30 DIAGNOSIS — R918 Other nonspecific abnormal finding of lung field: Secondary | ICD-10-CM | POA: Insufficient documentation

## 2019-09-30 DIAGNOSIS — K769 Liver disease, unspecified: Secondary | ICD-10-CM | POA: Diagnosis not present

## 2019-09-30 DIAGNOSIS — Z9221 Personal history of antineoplastic chemotherapy: Secondary | ICD-10-CM | POA: Insufficient documentation

## 2019-09-30 DIAGNOSIS — Z923 Personal history of irradiation: Secondary | ICD-10-CM | POA: Insufficient documentation

## 2019-09-30 DIAGNOSIS — K449 Diaphragmatic hernia without obstruction or gangrene: Secondary | ICD-10-CM | POA: Diagnosis not present

## 2019-09-30 DIAGNOSIS — Z9889 Other specified postprocedural states: Secondary | ICD-10-CM | POA: Diagnosis not present

## 2019-09-30 NOTE — Patient Instructions (Signed)
Coronavirus (COVID-19) Are you at risk?  Are you at risk for the Coronavirus (COVID-19)?  To be considered HIGH RISK for Coronavirus (COVID-19), you have to meet the following criteria:  . Traveled to [place], [place], [place], [place] or [place]; or in the [place] to [place], [place], [place], or [place]; and have fever, cough, and shortness of breath within the last 2 weeks of travel OR . Been in close contact with a person diagnosed with COVID-19 within the last 2 weeks and have fever, cough, and shortness of breath . IF YOU DO NOT MEET THESE CRITERIA, YOU ARE CONSIDERED LOW RISK FOR COVID-19.  What to do if you are HIGH RISK for COVID-19?  . If you are having a medical emergency, call 911. . Seek medical care right away. Before you go to a doctor's office, urgent care or emergency department, call ahead and tell them about your recent travel, contact with someone diagnosed with COVID-19, and your symptoms. You should receive instructions from your physician's office regarding next steps of care.  . When you arrive at healthcare provider, tell the healthcare staff immediately you have returned from visiting [place], [place], [place], [place] or [place]; or traveled in the [place] to [place], [place], [place], or [place]; in the last two weeks or you have been in close contact with a person diagnosed with COVID-19 in the last 2 weeks.   . Tell the health care staff about your symptoms: fever, cough and shortness of breath. . After you have been seen by a medical provider, you will be either: o Tested for (COVID-19) and discharged home on quarantine except to seek medical care if symptoms worsen, and asked to  - Stay home and avoid contact with others until you get your results (4-5 days)  - Avoid travel on public transportation if possible (such as bus, train, or airplane) or o Sent to the Emergency Department by EMS for evaluation, COVID-19 testing, and possible  admission depending on your condition and test results.  What to do if you are LOW RISK for COVID-19?  Reduce your risk of any infection by using the same precautions used for avoiding the common cold or flu:  . Wash your hands often with soap and warm water for at least 20 seconds.  If soap and water are not readily available, use an alcohol-based hand sanitizer with at least 60% alcohol.  . If coughing or sneezing, cover your mouth and nose by coughing or sneezing into the elbow areas of your shirt or coat, into a tissue or into your sleeve (not your hands). . Avoid shaking hands with others and consider head nods or verbal greetings only. . Avoid touching your eyes, nose, or mouth with unwashed hands.  . Avoid close contact with people who are sick. . Avoid places or events with large numbers of people in one location, like concerts or sporting events. . Carefully consider travel plans you have or are making. . If you are planning any travel outside or inside the [place], visit the CDC's Travelers' Health webpage for the latest health notices. . If you have some symptoms but not all symptoms, continue to monitor at home and seek medical attention if your symptoms worsen. . If you are having a medical emergency, call 911.   ADDITIONAL HEALTHCARE OPTIONS FOR PATIENTS  [facility] / e-Visit: [email or web address]         [facility]: [phone]  [facility]: [phone]                   [facility]: [phone]   

## 2019-10-06 DIAGNOSIS — Z510 Encounter for antineoplastic radiation therapy: Secondary | ICD-10-CM | POA: Diagnosis not present

## 2019-10-06 DIAGNOSIS — C50212 Malignant neoplasm of upper-inner quadrant of left female breast: Secondary | ICD-10-CM | POA: Diagnosis not present

## 2019-10-06 NOTE — Progress Notes (Signed)
  Radiation Oncology         [phone] ________________________________  Name: Cindy [name] MRN: [id number]  Date: 10/07/2019  DOB: September 27, 1950  Simulation Verification Note    ICD-10-CM   1. Malignant neoplasm of upper-inner quadrant of left breast in female, estrogen receptor negative ([place])  C50.212    Z17.1     NARRATIVE: The patient was brought to the treatment unit and placed in the planned treatment position. The clinical setup was verified. Then port films were obtained and uploaded to the radiation oncology medical record software.  The treatment beams were carefully compared against the planned radiation fields. The position location and shape of the radiation fields was reviewed. They targeted volume of tissue appears to be appropriately covered by the radiation beams. Organs at risk appear to be excluded as planned.  Based on my personal review, I approved the simulation verification. The patient's treatment will proceed as planned.  -----------------------------------  Blair Promise, PhD, MD  This document serves as a record of services personally performed by Gery Pray, MD. It was created on his behalf by Clerance Lav, a trained medical scribe. The creation of this record is based on the scribe's personal observations and the provider's statements to them. This document has been checked and approved by the attending provider.

## 2019-10-07 ENCOUNTER — Other Ambulatory Visit: Payer: Self-pay

## 2019-10-07 ENCOUNTER — Ambulatory Visit
Admission: RE | Admit: 2019-10-07 | Discharge: 2019-10-07 | Disposition: A | Discharge status: Home / Self Care | Payer: PPO | Source: Ambulatory Visit | Attending: Radiation Oncology | Admitting: Radiation Oncology

## 2019-10-07 ENCOUNTER — Ambulatory Visit: Payer: PPO | Admitting: Nurse Practitioner

## 2019-10-07 DIAGNOSIS — C50212 Malignant neoplasm of upper-inner quadrant of left female breast: Secondary | ICD-10-CM

## 2019-10-07 DIAGNOSIS — Z510 Encounter for antineoplastic radiation therapy: Secondary | ICD-10-CM | POA: Diagnosis not present

## 2019-10-07 DIAGNOSIS — Z171 Estrogen receptor negative status [ER-]: Secondary | ICD-10-CM

## 2019-10-08 ENCOUNTER — Other Ambulatory Visit: Payer: Self-pay

## 2019-10-08 ENCOUNTER — Ambulatory Visit
Admission: RE | Admit: 2019-10-08 | Discharge: 2019-10-08 | Disposition: A | Discharge status: Home / Self Care | Payer: PPO | Source: Ambulatory Visit | Attending: Radiation Oncology | Admitting: Radiation Oncology

## 2019-10-08 DIAGNOSIS — C50212 Malignant neoplasm of upper-inner quadrant of left female breast: Secondary | ICD-10-CM | POA: Diagnosis not present

## 2019-10-08 DIAGNOSIS — Z510 Encounter for antineoplastic radiation therapy: Secondary | ICD-10-CM | POA: Diagnosis not present

## 2019-10-09 ENCOUNTER — Ambulatory Visit
Admission: RE | Admit: 2019-10-09 | Discharge: 2019-10-09 | Disposition: A | Discharge status: Home / Self Care | Payer: PPO | Source: Ambulatory Visit | Attending: Radiation Oncology | Admitting: Radiation Oncology

## 2019-10-09 ENCOUNTER — Other Ambulatory Visit: Payer: Self-pay

## 2019-10-09 DIAGNOSIS — C50212 Malignant neoplasm of upper-inner quadrant of left female breast: Secondary | ICD-10-CM | POA: Diagnosis not present

## 2019-10-09 DIAGNOSIS — Z510 Encounter for antineoplastic radiation therapy: Secondary | ICD-10-CM | POA: Diagnosis not present

## 2019-10-12 ENCOUNTER — Other Ambulatory Visit: Payer: Self-pay

## 2019-10-12 ENCOUNTER — Ambulatory Visit
Admission: RE | Admit: 2019-10-12 | Discharge: 2019-10-12 | Disposition: A | Discharge status: Home / Self Care | Payer: PPO | Source: Ambulatory Visit | Attending: Radiation Oncology | Admitting: Radiation Oncology

## 2019-10-12 DIAGNOSIS — Z510 Encounter for antineoplastic radiation therapy: Secondary | ICD-10-CM | POA: Insufficient documentation

## 2019-10-12 DIAGNOSIS — C50212 Malignant neoplasm of upper-inner quadrant of left female breast: Secondary | ICD-10-CM | POA: Diagnosis not present

## 2019-10-12 DIAGNOSIS — Z171 Estrogen receptor negative status [ER-]: Secondary | ICD-10-CM | POA: Insufficient documentation

## 2019-10-13 ENCOUNTER — Ambulatory Visit: Payer: PPO

## 2019-10-14 ENCOUNTER — Ambulatory Visit
Admission: RE | Admit: 2019-10-14 | Discharge: 2019-10-14 | Disposition: A | Discharge status: Home / Self Care | Payer: PPO | Source: Ambulatory Visit | Attending: Radiation Oncology | Admitting: Radiation Oncology

## 2019-10-14 ENCOUNTER — Other Ambulatory Visit: Payer: Self-pay

## 2019-10-14 DIAGNOSIS — C50212 Malignant neoplasm of upper-inner quadrant of left female breast: Secondary | ICD-10-CM | POA: Diagnosis not present

## 2019-10-15 ENCOUNTER — Ambulatory Visit
Admission: RE | Admit: 2019-10-15 | Discharge: 2019-10-15 | Disposition: A | Discharge status: Home / Self Care | Payer: PPO | Source: Ambulatory Visit | Attending: Radiation Oncology | Admitting: Radiation Oncology

## 2019-10-15 ENCOUNTER — Other Ambulatory Visit: Payer: Self-pay

## 2019-10-15 DIAGNOSIS — C50212 Malignant neoplasm of upper-inner quadrant of left female breast: Secondary | ICD-10-CM | POA: Diagnosis not present

## 2019-10-16 ENCOUNTER — Ambulatory Visit
Admission: RE | Admit: 2019-10-16 | Discharge: 2019-10-16 | Disposition: A | Discharge status: Home / Self Care | Payer: PPO | Source: Ambulatory Visit | Attending: Radiation Oncology | Admitting: Radiation Oncology

## 2019-10-16 ENCOUNTER — Other Ambulatory Visit: Payer: Self-pay

## 2019-10-16 DIAGNOSIS — C50212 Malignant neoplasm of upper-inner quadrant of left female breast: Secondary | ICD-10-CM | POA: Diagnosis not present

## 2019-10-19 ENCOUNTER — Ambulatory Visit
Admission: RE | Admit: 2019-10-19 | Discharge: 2019-10-19 | Disposition: A | Discharge status: Home / Self Care | Payer: PPO | Source: Ambulatory Visit | Attending: Radiation Oncology | Admitting: Radiation Oncology

## 2019-10-19 ENCOUNTER — Other Ambulatory Visit: Payer: Self-pay

## 2019-10-19 DIAGNOSIS — C50212 Malignant neoplasm of upper-inner quadrant of left female breast: Secondary | ICD-10-CM | POA: Diagnosis not present

## 2019-10-20 ENCOUNTER — Ambulatory Visit
Admission: RE | Admit: 2019-10-20 | Discharge: 2019-10-20 | Disposition: A | Discharge status: Home / Self Care | Payer: PPO | Source: Ambulatory Visit | Attending: Radiation Oncology | Admitting: Radiation Oncology

## 2019-10-20 ENCOUNTER — Other Ambulatory Visit: Payer: Self-pay

## 2019-10-20 DIAGNOSIS — C50212 Malignant neoplasm of upper-inner quadrant of left female breast: Secondary | ICD-10-CM | POA: Diagnosis not present

## 2019-10-21 ENCOUNTER — Ambulatory Visit
Admission: RE | Admit: 2019-10-21 | Discharge: 2019-10-21 | Disposition: A | Discharge status: Home / Self Care | Payer: PPO | Source: Ambulatory Visit | Attending: Radiation Oncology | Admitting: Radiation Oncology

## 2019-10-21 ENCOUNTER — Other Ambulatory Visit: Payer: Self-pay

## 2019-10-21 DIAGNOSIS — C50212 Malignant neoplasm of upper-inner quadrant of left female breast: Secondary | ICD-10-CM | POA: Diagnosis not present

## 2019-10-22 ENCOUNTER — Other Ambulatory Visit: Payer: Self-pay

## 2019-10-22 ENCOUNTER — Ambulatory Visit
Admission: RE | Admit: 2019-10-22 | Discharge: 2019-10-22 | Disposition: A | Discharge status: Home / Self Care | Payer: PPO | Source: Ambulatory Visit | Attending: Radiation Oncology | Admitting: Radiation Oncology

## 2019-10-22 DIAGNOSIS — C50212 Malignant neoplasm of upper-inner quadrant of left female breast: Secondary | ICD-10-CM | POA: Diagnosis not present

## 2019-10-23 ENCOUNTER — Ambulatory Visit
Admission: RE | Admit: 2019-10-23 | Discharge: 2019-10-23 | Disposition: A | Discharge status: Home / Self Care | Payer: PPO | Source: Ambulatory Visit | Attending: Radiation Oncology | Admitting: Radiation Oncology

## 2019-10-23 ENCOUNTER — Other Ambulatory Visit: Payer: Self-pay

## 2019-10-23 DIAGNOSIS — C50212 Malignant neoplasm of upper-inner quadrant of left female breast: Secondary | ICD-10-CM | POA: Diagnosis not present

## 2019-10-23 MED ORDER — SONAFINE EX EMUL
1.0000 "application " | Freq: Two times a day (BID) | CUTANEOUS | Status: DC
  Administered 2019-10-23: 1 via TOPICAL

## 2019-10-23 MED ORDER — ALRA NON-METALLIC DEODORANT (RAD-ONC)
1.0000 "application " | Freq: Once | TOPICAL | Status: AC
  Administered 2019-10-23: 1 via TOPICAL

## 2019-10-26 ENCOUNTER — Ambulatory Visit
Admission: RE | Admit: 2019-10-26 | Discharge: 2019-10-26 | Disposition: A | Discharge status: Home / Self Care | Payer: PPO | Source: Ambulatory Visit | Attending: Radiation Oncology | Admitting: Radiation Oncology

## 2019-10-26 ENCOUNTER — Other Ambulatory Visit: Payer: Self-pay | Admitting: Hematology

## 2019-10-26 DIAGNOSIS — Z9889 Other specified postprocedural states: Secondary | ICD-10-CM

## 2019-10-26 DIAGNOSIS — C50212 Malignant neoplasm of upper-inner quadrant of left female breast: Secondary | ICD-10-CM | POA: Diagnosis not present

## 2019-10-27 ENCOUNTER — Ambulatory Visit
Admission: RE | Admit: 2019-10-27 | Discharge: 2019-10-27 | Disposition: A | Discharge status: Home / Self Care | Payer: PPO | Source: Ambulatory Visit | Attending: Radiation Oncology | Admitting: Radiation Oncology

## 2019-10-27 ENCOUNTER — Ambulatory Visit: Payer: PPO | Admitting: Radiation Oncology

## 2019-10-27 ENCOUNTER — Other Ambulatory Visit: Payer: Self-pay

## 2019-10-27 DIAGNOSIS — C50212 Malignant neoplasm of upper-inner quadrant of left female breast: Secondary | ICD-10-CM | POA: Diagnosis not present

## 2019-10-28 ENCOUNTER — Ambulatory Visit
Admission: RE | Admit: 2019-10-28 | Discharge: 2019-10-28 | Disposition: A | Discharge status: Home / Self Care | Payer: PPO | Source: Ambulatory Visit | Attending: Radiation Oncology | Admitting: Radiation Oncology

## 2019-10-28 ENCOUNTER — Other Ambulatory Visit: Payer: Self-pay

## 2019-10-28 ENCOUNTER — Ambulatory Visit: Payer: PPO

## 2019-10-28 DIAGNOSIS — C50212 Malignant neoplasm of upper-inner quadrant of left female breast: Secondary | ICD-10-CM | POA: Diagnosis not present

## 2019-10-29 ENCOUNTER — Ambulatory Visit: Payer: PPO

## 2019-10-29 ENCOUNTER — Ambulatory Visit
Admission: RE | Admit: 2019-10-29 | Discharge: 2019-10-29 | Disposition: A | Discharge status: Home / Self Care | Payer: PPO | Source: Ambulatory Visit | Attending: Radiation Oncology | Admitting: Radiation Oncology

## 2019-10-29 ENCOUNTER — Other Ambulatory Visit: Payer: Self-pay

## 2019-10-29 DIAGNOSIS — C50212 Malignant neoplasm of upper-inner quadrant of left female breast: Secondary | ICD-10-CM | POA: Diagnosis not present

## 2019-10-30 ENCOUNTER — Ambulatory Visit
Admission: RE | Admit: 2019-10-30 | Discharge: 2019-10-30 | Disposition: A | Discharge status: Home / Self Care | Payer: PPO | Source: Ambulatory Visit | Attending: Radiation Oncology | Admitting: Radiation Oncology

## 2019-10-30 ENCOUNTER — Other Ambulatory Visit: Payer: Self-pay

## 2019-10-30 DIAGNOSIS — C50212 Malignant neoplasm of upper-inner quadrant of left female breast: Secondary | ICD-10-CM | POA: Diagnosis not present

## 2019-11-02 ENCOUNTER — Ambulatory Visit: Payer: PPO

## 2019-11-02 ENCOUNTER — Other Ambulatory Visit: Payer: Self-pay

## 2019-11-02 ENCOUNTER — Ambulatory Visit
Admission: RE | Admit: 2019-11-02 | Discharge: 2019-11-02 | Disposition: A | Discharge status: Home / Self Care | Payer: PPO | Source: Ambulatory Visit | Attending: Radiation Oncology | Admitting: Radiation Oncology

## 2019-11-02 ENCOUNTER — Encounter: Payer: Self-pay | Admitting: *Deleted

## 2019-11-02 DIAGNOSIS — C50212 Malignant neoplasm of upper-inner quadrant of left female breast: Secondary | ICD-10-CM | POA: Diagnosis not present

## 2019-11-03 ENCOUNTER — Ambulatory Visit
Admission: RE | Admit: 2019-11-03 | Discharge: 2019-11-03 | Disposition: A | Discharge status: Home / Self Care | Payer: PPO | Source: Ambulatory Visit | Attending: Radiation Oncology | Admitting: Radiation Oncology

## 2019-11-03 ENCOUNTER — Other Ambulatory Visit: Payer: Self-pay

## 2019-11-03 ENCOUNTER — Ambulatory Visit: Payer: PPO

## 2019-11-03 ENCOUNTER — Encounter: Payer: Self-pay | Admitting: *Deleted

## 2019-11-03 DIAGNOSIS — C50212 Malignant neoplasm of upper-inner quadrant of left female breast: Secondary | ICD-10-CM | POA: Diagnosis not present

## 2019-11-04 ENCOUNTER — Encounter: Payer: Self-pay | Admitting: Radiation Oncology

## 2019-11-04 ENCOUNTER — Other Ambulatory Visit: Payer: Self-pay

## 2019-11-04 ENCOUNTER — Ambulatory Visit
Admission: RE | Admit: 2019-11-04 | Discharge: 2019-11-04 | Disposition: A | Discharge status: Home / Self Care | Payer: PPO | Source: Ambulatory Visit | Attending: Radiation Oncology | Admitting: Radiation Oncology

## 2019-11-04 DIAGNOSIS — C50212 Malignant neoplasm of upper-inner quadrant of left female breast: Secondary | ICD-10-CM | POA: Diagnosis not present

## 2019-11-13 ENCOUNTER — Encounter (HOSPITAL_BASED_OUTPATIENT_CLINIC_OR_DEPARTMENT_OTHER): Payer: Self-pay | Admitting: Surgery

## 2019-11-13 ENCOUNTER — Other Ambulatory Visit: Payer: Self-pay

## 2019-11-13 NOTE — Progress Notes (Signed)
[facility]   Telephone:([phone] Fax:([phone]   Clinic Follow up Note   Patient Care Team: Mosie Lukes, MD as PCP - General (Family Medicine) Cherylann Banas, Cherly Anderson, MD (Inactive) (Obstetrics and Gynecology) Rockwell Germany, RN as Oncology Nurse Navigator Mauro Kaufmann, RN as Oncology Nurse Navigator Erroll Luna, MD as Consulting Physician (General Surgery) Truitt Merle, MD as Consulting Physician (Hematology) Gery Pray, MD as Consulting Physician (Radiation Oncology)  Date of Service:  11/16/2019  CHIEF COMPLAINT: F/u of left breast cancer  SUMMARY OF ONCOLOGIC HISTORY: Oncology History Overview Note  Cancer Staging Malignant neoplasm of upper-inner quadrant of left breast in female, estrogen receptor negative ([place]) Staging form: Breast, AJCC 8th Edition - Clinical stage from 02/06/2019: Stage IB (cT1c, cN0, cM0, G2, ER-, PR-, HER2-) - Signed by Truitt Merle, MD on 02/17/2019 - Pathologic stage from 03/10/2019: Stage IIA (pT2, pN0, cM0, G3, ER-, PR-, HER2-) - Signed by Truitt Merle, MD on 03/12/2019    Malignant neoplasm of upper-inner quadrant of left breast in female, estrogen receptor negative ([place])  02/04/2019 Mammogram   Diagnostic mammogram 02/04/19  IMPRESSION: Highly suspicious 1.7 x1.5 x1.7cm at 10:00 position of UPPER INNER LEFT breast mass 10 cm from nipple corresponding to the screening study finding. Tissue sampling recommended.   No abnormal LEFT axillary lymph nodes.   02/06/2019 Cancer Staging   Staging form: Breast, AJCC 8th Edition - Clinical stage from 02/06/2019: Stage IB (cT1c, cN0, cM0, G2, ER-, PR-, HER2-) - Signed by Truitt Merle, MD on 02/17/2019   02/06/2019 Initial Biopsy   Diagnosis 02/06/19 Breast, left, needle core biopsy, 10 o'clock, 10cm/n, ribbon clip - INVASIVE MAMMARY CARCINOMA.   02/06/2019 Receptors her2   Results: IMMUNOHISTOCHEMICAL AND MORPHOMETRIC ANALYSIS PERFORMED MANUALLY The tumor cells are NEGATIVE for Her2  (1+). Estrogen Receptor: 0%, NEGATIVE Progesterone Receptor: 0%, NEGATIVE Proliferation Marker Ki67: 40%   02/17/2019 Initial Diagnosis   Malignant neoplasm of upper-inner quadrant of left breast in female, estrogen receptor negative ([place])   03/10/2019 Cancer Staging   Staging form: Breast, AJCC 8th Edition - Pathologic stage from 03/10/2019: Stage IIA (pT2, pN0, cM0, G3, ER-, PR-, HER2-) - Signed by Truitt Merle, MD on 03/12/2019   03/10/2019 Surgery   LEFT BREAST LUMPECTOMY WITH RADIOACTIVE SEED AND LEFT SENTINEL LYMPH NODE MAPPING and PAC placement  by Dr. Brantley Stage  03/10/19   03/10/2019 Pathology Results   DIAGNOSIS: 03/10/19  A. BREAST, LEFT, LUMPECTOMY:  - Invasive ductal carcinoma, grade 3, 2.5 cm  - Carcinoma is 0.3 cm from superior margin  - Medial resection edge is 0.1 cm from carcinoma (final medial margin is  represented by part 2 which is negative for carcinoma)  - Negative for lymphovascular or perineural invasion  - Biopsy site changes  - See oncology table   B. BREAST, LEFT MEDIAL MARGIN, EXCISION:  - Benign breast parenchyma, negative for carcinoma   C. LYMPH NODE, LEFT AXILLARY, SENTINEL BIOPSY:  - Lymph node, negative for carcinoma (0/1)    04/06/2019 Imaging   Bone scan IMPRESSION: Mild focus of uptake is seen involving the anterior portion of left upper rib, but there is no corresponding abnormality seen on the CT scan of the same day. Potentially this may represent degenerative change or secondary to overlying postsurgical change. Attention on follow-up is recommended.   No definite scintigraphic evidence of osseous metastases.   04/06/2019 Imaging   CT CAP IMPRESSION: 1. Surgical changes left breast with borderline enlarged left axillary lymph node. No  other definite evidence for metastatic disease in the chest, abdomen, or pelvis. 2. Tiny bilateral pulmonary nodules. Attention on follow-up recommended. 3. Scattered small hypoattenuating lesions in the  liver parenchyma. Some of these have attenuation higher than would be expected for simple fluid and may be cysts complicated by proteinaceous debris or hemorrhage. MRI without and with contrast recommended to further evaluate. 4. Small to moderate hiatal hernia. 5. Aortic Atherosclerosis (ICD10-I70.0).   04/08/2019 - 09/14/2019 Chemotherapy   chemo AC q2weeks for 4 cycles starting 04/08/19-05/20/19 followed by Taxol weekly for 12 weeks starting 06/09/19.     09/16/2019 Imaging   CT CAP w contrast IMPRESSION: 1. No evidence for metastatic disease in the chest, abdomen, or pelvis. 2. No change in the tiny bilateral pulmonary nodules, likely benign. Attention on follow-up suggested. 3. Stable appearance of multiple hypoattenuating liver lesions. None of these lesions show suspicious enhancement on today's study in there probably a combination of simple and complex cysts. There are several tiny liver lesions too small to characterize on today's study. Continued attention on follow-up recommended. 4. Small hiatal hernia. 5.  Aortic Atherosclerois (ICD10-170.0)   10/07/2019 - 11/04/2019 Radiation Therapy   Adjuvant RT with Dr Sondra Come 10/07/19-11/04/19      CURRENT THERAPY:  Surveillance   INTERVAL HISTORY:  Cindy [name] is here for a follow up and treatment. She presents to the clinic alone. She notes she is doing better off chemo. She feels she is still tired but improving daily. She notes she has dry cough since radiation and has gotten progressively worse. After eating she will have mild production with cough. She just started using cough drops and plans to get benadryl spray for her skin irritation from RT. She notes cream from Dr Sondra Come did not work for her.    REVIEW OF SYSTEMS:   Constitutional: Denies fevers, chills or abnormal weight loss Eyes: Denies blurriness of vision Ears, nose, mouth, throat, and face: Denies mucositis or sore throat Respiratory: Denies dyspnea or  wheezes (+) Cough  Cardiovascular: Denies palpitation, chest discomfort or lower extremity swelling Gastrointestinal:  Denies nausea, heartburn or change in bowel habits Skin: Denies abnormal skin rashes (+) Skin irritation over left chest and breast  Lymphatics: Denies new lymphadenopathy or easy bruising Neurological:Denies numbness, tingling or new weaknesses Behavioral/Psych: Mood is stable, no new changes  All other systems were reviewed with the patient and are negative.  MEDICAL HISTORY:  Past Medical History:  Diagnosis Date  . Allergy   . Depression    ? if bipolar works the best  . Elevated fasting blood sugar    hx  . H/O measles   . Headache   . Hiatal hernia   . History of chicken pox   . Hx of migraines   . Microscopic hematuria    advised to see urology ? never went  . Preventative health care 06/20/2011  . Radiation dermatitis    chest    SURGICAL HISTORY: Past Surgical History:  Procedure Laterality Date  . BREAST LUMPECTOMY WITH RADIOACTIVE SEED AND SENTINEL LYMPH NODE BIOPSY Left 03/10/2019   Procedure: LEFT BREAST LUMPECTOMY WITH RADIOACTIVE SEED AND LEFT SENTINEL LYMPH NODE MAPPING;  Surgeon: Erroll Luna, MD;  Location: [facility];  Service: General;  Laterality: Left;  . endometrial polyps     2 surgeries by Dr Cathlean Cower  . EYE SURGERY  04/29/2009   Rt eye tighten up  . PORTACATH PLACEMENT N/A 03/10/2019   Procedure: INSERTION PORT-A-CATH WITH ULTRASOUND;  Surgeon:  Erroll Luna, MD;  Location: [facility];  Service: General;  Laterality: N/A;  . steel plate in skull Left    from a crushed skull    I have reviewed the social history and family history with the patient and they are unchanged from previous note.  ALLERGIES:  is allergic to bromfed and venlafaxine.  MEDICATIONS:  Current Outpatient Medications  Medication Sig Dispense Refill  . Ascorbic Acid (VITAMIN C PO) Take by mouth.    Marland Kitchen BIOTIN PO Take by mouth.    Marland Kitchen FLUoxetine (PROZAC) 40 MG  capsule Take 1 capsule (40 mg total) by mouth daily. 90 capsule 2  . lidocaine-prilocaine (EMLA) cream Apply to affected area once 30 g 3  . Multiple Vitamin (MULTIVITAMIN WITH MINERALS) TABS tablet Take 1 tablet by mouth daily.    . ondansetron (ZOFRAN) 8 MG tablet Take 1 tablet (8 mg total) by mouth 2 (two) times daily as needed. Start on the third day after chemotherapy. (Patient not taking: Reported on 09/30/2019) 30 tablet 2  . prochlorperazine (COMPAZINE) 10 MG tablet Take 1 tablet (10 mg total) by mouth every 6 (six) hours as needed (Nausea or vomiting). (Patient not taking: Reported on 09/30/2019) 30 tablet 2   No current facility-administered medications for this visit.    PHYSICAL EXAMINATION: ECOG PERFORMANCE STATUS: 1 - Symptomatic but completely ambulatory  Vitals:   11/16/19 1359  BP: 118/71  Pulse: 98  Resp: 18  Temp: 99 F (37.2 C)  SpO2: 97%   Filed Weights   11/16/19 1359  Weight: 143 lb 12.8 oz (65.2 kg)    GENERAL:alert, no distress and comfortable SKIN: skin color, texture, turgor are normal, no rashes or significant lesions EYES: normal, Conjunctiva are pink and non-injected, sclera clear  NECK: supple, thyroid normal size, non-tender, without nodularity LYMPH:  no palpable lymphadenopathy in the cervical, axillary  LUNGS: clear to auscultation and percussion with normal breathing effort HEART: regular rate & rhythm and no murmurs and no lower extremity edema ABDOMEN:abdomen soft, non-tender and normal bowel sounds Musculoskeletal:no cyanosis of digits and no clubbing  NEURO: alert & oriented x 3 with fluent speech, no focal motor/sensory deficits BREAST: (+) Left lumpectomy: Surgical incision healed well (+) Diffuse skin erythema of left chest and breast from RT. No palpable mass, nodules or adenopathy bilaterally. Breast exam benign.   LABORATORY DATA:  I have reviewed the data as listed CBC Latest Ref Rng & Units 11/16/2019 09/14/2019 09/09/2019  WBC 4.0 -  10.5 K/uL 5.6 3.6(L) 1.9(L)  Hemoglobin 12.0 - 15.0 g/dL 11.9(L) 11.0(L) 10.3(L)  Hematocrit 36.0 - 46.0 % 36.4 32.9(L) 30.5(L)  Platelets 150 - 400 K/uL 237 221 211     CMP Latest Ref Rng & Units 11/16/2019 09/14/2019 09/09/2019  Glucose 70 - 99 mg/dL 106(H) 105(H) 100(H)  BUN 8 - 23 mg/dL 22 17 13   Creatinine 0.44 - 1.00 mg/dL 0.81 0.77 0.78  Sodium 135 - 145 mmol/L 140 140 142  Potassium 3.5 - 5.1 mmol/L 4.4 3.9 4.1  Chloride 98 - 111 mmol/L 104 107 107  CO2 22 - 32 mmol/L 27 23 25   Calcium 8.9 - 10.3 mg/dL 9.5 9.1 8.8(L)  Total Protein 6.5 - 8.1 g/dL 6.7 6.3(L) 6.1(L)  Total Bilirubin 0.3 - 1.2 mg/dL 0.3 0.4 0.3  Alkaline Phos 38 - 126 U/L 70 60 55  AST 15 - 41 U/L 20 16 18   ALT 0 - 44 U/L 21 19 21       RADIOGRAPHIC STUDIES: I  have personally reviewed the radiological images as listed and agreed with the findings in the report. No results found.   ASSESSMENT & PLAN:  Cindy [name] is a 68 y.o. female with    1.Malignant neoplasm of upper-inner quadrant of left breast, StageIIA,p(T2N0M0), ER/PR/HER2-,triple negative,GradeIII -She was diagnosed in 02/2019. She underwent left breast lumpectomy with SLNB on 03/10/19. -Given her aggressive triple negative breast cancer,she was treated with adjuvantchemoAC-T which she completed in early 09/2019. She recently completed Adjuvant RT with Dr Sondra Come 10/07/19-11/04/19.  -She plans to have PAC removal on 11/25/19. She is recovering from chemo and RT well except residual fatigue and dry cough.  -She is clinically doing well. Lab reviewed, her CBC and CMP are within normal limits except Hg 11.9. Her physical exam showed diffuse skin erythema of her left chest/breast from RT. She will be using topical benadryl spray as the cream given by Dr Sondra Come did not work. There is no clinical concern for recurrence. -Will proceed with Surveillance. I reviewed surveillance plan with her. Next mammogram in 01/29/20.  -Her 01/2017 DEXA was normal. She  rather wait until 2022 until she repeats DEXA. She is on multivitamin, if not enough, she should take additional OTC Vit D  -I offered Survivorship clinic with NP Lacie, she declined.  -F/u in 4 months   2. Genetic Testing  -Her mother had colon cancer and father had bladder cancer, her MGF had cancer and her maternal great grandmother had breast cancer. She is interested in [employer] covers it.  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 -I encouraged her to start multivitamin. -Improvedwith blood transfusion on 12/23 -Has much improved off chemo, Hg at 11.9 today (11/16/19)  4. Acid reflux, Hiatal hernia   -Does cause her to cough.  -ContinuePrilosec OTC.  -She also has hiatal hernia which leads to mild cough and nausea. Partly managed with tums.   5. Dry Cough  -She has had dry cough since she completed RT 11/04/19. She notes this has worsened since. Lungs sound clear on exam (11/16/19). This is possibly related to her RT.  -I discussed getting Chest Xray today. She declined for now. I advised her to watch for SOB and sputum production.    -She has been taking cough drop and to start OTC robitussin. She is not interested in hycodan at this time. -She plans to proceed with COVID19 vaccine in 11/2019.    PLAN: -Lab and F/u in 3 months  -Mammogram on 01/29/20 -she declined survivorship     No problem-specific Assessment & Plan notes found for this encounter.   No orders of the defined types were placed in this encounter.  All questions were answered. The patient knows to call the clinic with any problems, questions or concerns. No barriers to learning was detected. The total time spent in the appointment was 25 minutes.     Truitt Merle, MD 11/16/2019   I, Joslyn Devon, am acting as scribe for Truitt Merle, MD.   I have reviewed the above documentation for accuracy and completeness, and I agree with the above.

## 2019-11-16 ENCOUNTER — Inpatient Hospital Stay: Payer: PPO | Attending: Hematology

## 2019-11-16 ENCOUNTER — Inpatient Hospital Stay: Payer: PPO

## 2019-11-16 ENCOUNTER — Encounter: Payer: Self-pay | Admitting: Hematology

## 2019-11-16 ENCOUNTER — Other Ambulatory Visit: Payer: Self-pay

## 2019-11-16 ENCOUNTER — Inpatient Hospital Stay: Payer: PPO | Admitting: Hematology

## 2019-11-16 VITALS — BP 118/71 | HR 98 | Temp 99.0°F | Resp 18 | Ht 64.0 in | Wt 143.8 lb

## 2019-11-16 DIAGNOSIS — Z171 Estrogen receptor negative status [ER-]: Secondary | ICD-10-CM | POA: Diagnosis not present

## 2019-11-16 DIAGNOSIS — C50212 Malignant neoplasm of upper-inner quadrant of left female breast: Secondary | ICD-10-CM

## 2019-11-16 DIAGNOSIS — Z800 Family history of malignant neoplasm of digestive organs: Secondary | ICD-10-CM | POA: Insufficient documentation

## 2019-11-16 DIAGNOSIS — Z803 Family history of malignant neoplasm of breast: Secondary | ICD-10-CM | POA: Insufficient documentation

## 2019-11-16 DIAGNOSIS — Z923 Personal history of irradiation: Secondary | ICD-10-CM | POA: Insufficient documentation

## 2019-11-16 DIAGNOSIS — K449 Diaphragmatic hernia without obstruction or gangrene: Secondary | ICD-10-CM | POA: Insufficient documentation

## 2019-11-16 DIAGNOSIS — F329 Major depressive disorder, single episode, unspecified: Secondary | ICD-10-CM | POA: Insufficient documentation

## 2019-11-16 DIAGNOSIS — Z9221 Personal history of antineoplastic chemotherapy: Secondary | ICD-10-CM | POA: Diagnosis not present

## 2019-11-16 DIAGNOSIS — Z8052 Family history of malignant neoplasm of bladder: Secondary | ICD-10-CM | POA: Diagnosis not present

## 2019-11-16 DIAGNOSIS — K219 Gastro-esophageal reflux disease without esophagitis: Secondary | ICD-10-CM | POA: Diagnosis not present

## 2019-11-16 DIAGNOSIS — D6481 Anemia due to antineoplastic chemotherapy: Secondary | ICD-10-CM | POA: Insufficient documentation

## 2019-11-16 DIAGNOSIS — R05 Cough: Secondary | ICD-10-CM | POA: Insufficient documentation

## 2019-11-16 DIAGNOSIS — Z79899 Other long term (current) drug therapy: Secondary | ICD-10-CM | POA: Diagnosis not present

## 2019-11-16 DIAGNOSIS — Z95828 Presence of other vascular implants and grafts: Secondary | ICD-10-CM

## 2019-11-16 LAB — CBC WITH DIFFERENTIAL (CANCER CENTER ONLY)
Abs Immature Granulocytes: 0.01 10*3/uL (ref 0.00–0.07)
Basophils Absolute: 0 10*3/uL (ref 0.0–0.1)
Basophils Relative: 0 %
Eosinophils Absolute: 0.2 10*3/uL (ref 0.0–0.5)
Eosinophils Relative: 3 %
HCT: 36.4 % (ref 36.0–46.0)
Hemoglobin: 11.9 g/dL — ABNORMAL LOW (ref 12.0–15.0)
Immature Granulocytes: 0 %
Lymphocytes Relative: 21 %
Lymphs Abs: 1.2 10*3/uL (ref 0.7–4.0)
MCH: 31.3 pg (ref 26.0–34.0)
MCHC: 32.7 g/dL (ref 30.0–36.0)
MCV: 95.8 fL (ref 80.0–100.0)
Monocytes Absolute: 0.6 10*3/uL (ref 0.1–1.0)
Monocytes Relative: 10 %
Neutro Abs: 3.6 10*3/uL (ref 1.7–7.7)
Neutrophils Relative %: 66 %
Platelet Count: 237 10*3/uL (ref 150–400)
RBC: 3.8 MIL/uL — ABNORMAL LOW (ref 3.87–5.11)
RDW: 13.5 % (ref 11.5–15.5)
WBC Count: 5.6 10*3/uL (ref 4.0–10.5)
nRBC: 0 % (ref 0.0–0.2)

## 2019-11-16 LAB — CMP (CANCER CENTER ONLY)
ALT: 21 U/L (ref 0–44)
AST: 20 U/L (ref 15–41)
Albumin: 3.6 g/dL (ref 3.5–5.0)
Alkaline Phosphatase: 70 U/L (ref 38–126)
Anion gap: 9 (ref 5–15)
BUN: 22 mg/dL (ref 8–23)
CO2: 27 mmol/L (ref 22–32)
Calcium: 9.5 mg/dL (ref 8.9–10.3)
Chloride: 104 mmol/L (ref 98–111)
Creatinine: 0.81 mg/dL (ref 0.44–1.00)
GFR, Est AFR Am: >60 mL/min (ref >60)
GFR, Estimated: >60 mL/min (ref >60)
Glucose, Bld: 106 mg/dL — ABNORMAL HIGH (ref 70–99)
Potassium: 4.4 mmol/L (ref 3.5–5.1)
Sodium: 140 mmol/L (ref 135–145)
Total Bilirubin: 0.3 mg/dL (ref 0.3–1.2)
Total Protein: 6.7 g/dL (ref 6.5–8.1)

## 2019-11-16 MED ORDER — HEPARIN SOD (PORK) LOCK FLUSH 100 UNIT/ML IV SOLN
500.0000 [IU] | Freq: Once | INTRAVENOUS | Status: AC
  Administered 2019-11-16: 500 [IU]
  Filled 2019-11-16: qty 5

## 2019-11-16 MED ORDER — SODIUM CHLORIDE 0.9% FLUSH
10.0000 mL | Freq: Once | INTRAVENOUS | Status: AC
  Administered 2019-11-16: 10 mL
  Filled 2019-11-16: qty 10

## 2019-11-17 ENCOUNTER — Telehealth: Payer: Self-pay | Admitting: Hematology

## 2019-11-17 NOTE — Telephone Encounter (Signed)
Scheduled appt per 6/7 los.  Spoke with pt and they are aware of the appt date and time.

## 2019-11-19 NOTE — Progress Notes (Signed)

## 2019-11-21 ENCOUNTER — Other Ambulatory Visit (HOSPITAL_COMMUNITY)
Admission: RE | Admit: 2019-11-21 | Discharge: 2019-11-21 | Disposition: A | Discharge status: Home / Self Care | Payer: PPO | Source: Ambulatory Visit | Attending: Surgery | Admitting: Surgery

## 2019-11-21 DIAGNOSIS — Z01812 Encounter for preprocedural laboratory examination: Secondary | ICD-10-CM | POA: Insufficient documentation

## 2019-11-21 DIAGNOSIS — Z20822 Contact with and (suspected) exposure to covid-19: Secondary | ICD-10-CM | POA: Diagnosis not present

## 2019-11-21 LAB — SARS CORONAVIRUS 2 (TAT 6-24 HRS): SARS Coronavirus 2: NEGATIVE

## 2019-11-25 ENCOUNTER — Ambulatory Visit (HOSPITAL_BASED_OUTPATIENT_CLINIC_OR_DEPARTMENT_OTHER)
Admission: RE | Admit: 2019-11-25 | Discharge: 2019-11-25 | Disposition: A | Discharge status: Home / Self Care | Payer: PPO | Attending: Surgery | Admitting: Surgery

## 2019-11-25 ENCOUNTER — Ambulatory Visit (HOSPITAL_BASED_OUTPATIENT_CLINIC_OR_DEPARTMENT_OTHER): Payer: PPO | Admitting: Certified Registered"

## 2019-11-25 ENCOUNTER — Other Ambulatory Visit: Payer: Self-pay

## 2019-11-25 ENCOUNTER — Encounter (HOSPITAL_BASED_OUTPATIENT_CLINIC_OR_DEPARTMENT_OTHER)
Admission: RE | Disposition: A | Discharge status: Home / Self Care | Payer: Self-pay | Source: Home / Self Care | Attending: Surgery

## 2019-11-25 ENCOUNTER — Encounter (HOSPITAL_BASED_OUTPATIENT_CLINIC_OR_DEPARTMENT_OTHER): Payer: Self-pay | Admitting: Surgery

## 2019-11-25 DIAGNOSIS — R739 Hyperglycemia, unspecified: Secondary | ICD-10-CM | POA: Diagnosis not present

## 2019-11-25 DIAGNOSIS — E663 Overweight: Secondary | ICD-10-CM | POA: Diagnosis not present

## 2019-11-25 DIAGNOSIS — Z6824 Body mass index (BMI) 24.0-24.9, adult: Secondary | ICD-10-CM | POA: Diagnosis not present

## 2019-11-25 DIAGNOSIS — G43909 Migraine, unspecified, not intractable, without status migrainosus: Secondary | ICD-10-CM | POA: Insufficient documentation

## 2019-11-25 DIAGNOSIS — Z87891 Personal history of nicotine dependence: Secondary | ICD-10-CM | POA: Insufficient documentation

## 2019-11-25 DIAGNOSIS — Z79899 Other long term (current) drug therapy: Secondary | ICD-10-CM | POA: Diagnosis not present

## 2019-11-25 DIAGNOSIS — C50912 Malignant neoplasm of unspecified site of left female breast: Secondary | ICD-10-CM | POA: Diagnosis not present

## 2019-11-25 DIAGNOSIS — F329 Major depressive disorder, single episode, unspecified: Secondary | ICD-10-CM | POA: Insufficient documentation

## 2019-11-25 DIAGNOSIS — Z452 Encounter for adjustment and management of vascular access device: Secondary | ICD-10-CM | POA: Insufficient documentation

## 2019-11-25 DIAGNOSIS — K449 Diaphragmatic hernia without obstruction or gangrene: Secondary | ICD-10-CM | POA: Diagnosis not present

## 2019-11-25 DIAGNOSIS — Z9221 Personal history of antineoplastic chemotherapy: Secondary | ICD-10-CM | POA: Diagnosis not present

## 2019-11-25 HISTORY — DX: Radiodermatitis, unspecified: L58.9

## 2019-11-25 HISTORY — DX: Diaphragmatic hernia without obstruction or gangrene: K44.9

## 2019-11-25 HISTORY — PX: PORT-A-CATH REMOVAL: SHX5289

## 2019-11-25 SURGERY — REMOVAL PORT-A-CATH
Anesthesia: Monitor Anesthesia Care | Site: Chest | Laterality: Right

## 2019-11-25 MED ORDER — DEXAMETHASONE SODIUM PHOSPHATE 4 MG/ML IJ SOLN
INTRAMUSCULAR | Status: DC | PRN
  Administered 2019-11-25: 5 mg via INTRAVENOUS

## 2019-11-25 MED ORDER — CHLORHEXIDINE GLUCONATE CLOTH 2 % EX PADS: 6.0000 | MEDICATED_PAD | Freq: Once | CUTANEOUS | Status: DC

## 2019-11-25 MED ORDER — OXYCODONE HCL 5 MG PO TABS: 5.0000 mg | ORAL_TABLET | Freq: Once | ORAL | Status: DC | PRN

## 2019-11-25 MED ORDER — MIDAZOLAM HCL 5 MG/5ML IJ SOLN
INTRAMUSCULAR | Status: DC | PRN
  Administered 2019-11-25: 2 mg via INTRAVENOUS

## 2019-11-25 MED ORDER — LIDOCAINE 2% (20 MG/ML) 5 ML SYRINGE
INTRAMUSCULAR | Status: AC
  Filled 2019-11-25: qty 5

## 2019-11-25 MED ORDER — HYDROMORPHONE HCL 1 MG/ML IJ SOLN: 0.2500 mg | INTRAMUSCULAR | Status: DC | PRN

## 2019-11-25 MED ORDER — DEXAMETHASONE SODIUM PHOSPHATE 10 MG/ML IJ SOLN
INTRAMUSCULAR | Status: AC
  Filled 2019-11-25: qty 1

## 2019-11-25 MED ORDER — ONDANSETRON HCL 4 MG/2ML IJ SOLN
INTRAMUSCULAR | Status: AC
  Filled 2019-11-25: qty 2

## 2019-11-25 MED ORDER — OXYCODONE HCL 5 MG/5ML PO SOLN: 5.0000 mg | Freq: Once | ORAL | Status: DC | PRN

## 2019-11-25 MED ORDER — BUPIVACAINE HCL (PF) 0.25 % IJ SOLN
INTRAMUSCULAR | Status: DC | PRN
  Administered 2019-11-25: 10 mL

## 2019-11-25 MED ORDER — FENTANYL CITRATE (PF) 100 MCG/2ML IJ SOLN
INTRAMUSCULAR | Status: DC | PRN
  Administered 2019-11-25 (×2): 25 ug via INTRAVENOUS
  Administered 2019-11-25: 50 ug via INTRAVENOUS

## 2019-11-25 MED ORDER — ONDANSETRON HCL 4 MG/2ML IJ SOLN
INTRAMUSCULAR | Status: DC | PRN
  Administered 2019-11-25: 4 mg via INTRAVENOUS

## 2019-11-25 MED ORDER — BUPIVACAINE-EPINEPHRINE 0.25% -1:200000 IJ SOLN: INTRAMUSCULAR | Status: DC | PRN

## 2019-11-25 MED ORDER — PROPOFOL 10 MG/ML IV BOLUS
INTRAVENOUS | Status: DC | PRN
  Administered 2019-11-25 (×2): 20 mg via INTRAVENOUS

## 2019-11-25 MED ORDER — ACETAMINOPHEN 500 MG PO TABS: 1000.0000 mg | ORAL_TABLET | ORAL | Status: DC

## 2019-11-25 MED ORDER — BUPIVACAINE HCL (PF) 0.25 % IJ SOLN
INTRAMUSCULAR | Status: AC
  Filled 2019-11-25: qty 30

## 2019-11-25 MED ORDER — CEFAZOLIN SODIUM-DEXTROSE 2-4 GM/100ML-% IV SOLN
INTRAVENOUS | Status: AC
  Filled 2019-11-25: qty 100

## 2019-11-25 MED ORDER — MIDAZOLAM HCL 2 MG/2ML IJ SOLN
INTRAMUSCULAR | Status: AC
  Filled 2019-11-25: qty 2

## 2019-11-25 MED ORDER — ACETAMINOPHEN 500 MG PO TABS
ORAL_TABLET | ORAL | Status: AC
  Filled 2019-11-25: qty 2

## 2019-11-25 MED ORDER — CEFAZOLIN SODIUM-DEXTROSE 2-4 GM/100ML-% IV SOLN
2.0000 g | INTRAVENOUS | Status: AC
  Administered 2019-11-25: 2 g via INTRAVENOUS

## 2019-11-25 MED ORDER — LACTATED RINGERS IV SOLN: INTRAVENOUS | Status: DC

## 2019-11-25 MED ORDER — FENTANYL CITRATE (PF) 100 MCG/2ML IJ SOLN
INTRAMUSCULAR | Status: AC
  Filled 2019-11-25: qty 2

## 2019-11-25 MED ORDER — PROPOFOL 500 MG/50ML IV EMUL
INTRAVENOUS | Status: AC
  Filled 2019-11-25: qty 50

## 2019-11-25 MED ORDER — PROMETHAZINE HCL 25 MG/ML IJ SOLN: 6.2500 mg | INTRAMUSCULAR | Status: DC | PRN

## 2019-11-25 SURGICAL SUPPLY — 36 items
ADH SKN CLS APL DERMABOND .7 (GAUZE/BANDAGES/DRESSINGS) ×1
APL PRP STRL LF DISP 70% ISPRP (MISCELLANEOUS) ×1
APL SKNCLS STERI-STRIP NONHPOA (GAUZE/BANDAGES/DRESSINGS)
BENZOIN TINCTURE PRP APPL 2/3 (GAUZE/BANDAGES/DRESSINGS) IMPLANT
BLADE SURG 15 STRL LF DISP TIS (BLADE) ×1 IMPLANT
BLADE SURG 15 STRL SS (BLADE) ×2
CHLORAPREP W/TINT 26 (MISCELLANEOUS) ×2 IMPLANT
COVER BACK TABLE 60X90IN (DRAPES) ×2 IMPLANT
COVER MAYO STAND STRL (DRAPES) ×2 IMPLANT
COVER WAND RF STERILE (DRAPES) IMPLANT
DECANTER SPIKE VIAL GLASS SM (MISCELLANEOUS) ×2 IMPLANT
DERMABOND ADVANCED (GAUZE/BANDAGES/DRESSINGS) ×1
DERMABOND ADVANCED .7 DNX12 (GAUZE/BANDAGES/DRESSINGS) ×1 IMPLANT
DRAPE LAPAROTOMY 100X72 PEDS (DRAPES) ×2 IMPLANT
DRAPE UTILITY XL STRL (DRAPES) ×2 IMPLANT
ELECT REM PT RETURN 9FT ADLT (ELECTROSURGICAL) ×2
ELECTRODE REM PT RTRN 9FT ADLT (ELECTROSURGICAL) ×1 IMPLANT
GLOVE BIOGEL M 6.5 STRL (GLOVE) ×1 IMPLANT
GLOVE BIOGEL PI IND STRL 7.0 (GLOVE) ×2 IMPLANT
GLOVE BIOGEL PI IND STRL 8 (GLOVE) ×1 IMPLANT
GLOVE BIOGEL PI INDICATOR 7.0 (GLOVE) ×2
GLOVE BIOGEL PI INDICATOR 8 (GLOVE) ×1
GLOVE ECLIPSE 8.0 STRL XLNG CF (GLOVE) ×2 IMPLANT
GOWN STRL REUS W/ TWL LRG LVL3 (GOWN DISPOSABLE) ×2 IMPLANT
GOWN STRL REUS W/TWL LRG LVL3 (GOWN DISPOSABLE) ×4
NEEDLE HYPO 25X1 1.5 SAFETY (NEEDLE) ×2 IMPLANT
NS IRRIG 1000ML POUR BTL (IV SOLUTION) IMPLANT
PENCIL SMOKE EVACUATOR (MISCELLANEOUS) IMPLANT
SET BASIN DAY SURGERY F.S. (CUSTOM PROCEDURE TRAY) ×2 IMPLANT
SLEEVE SCD COMPRESS KNEE MED (MISCELLANEOUS) IMPLANT
SPONGE LAP 4X18 RFD (DISPOSABLE) ×2 IMPLANT
STRIP CLOSURE SKIN 1/2X4 (GAUZE/BANDAGES/DRESSINGS) IMPLANT
SUT MON AB 4-0 PC3 18 (SUTURE) ×2 IMPLANT
SUT VICRYL 3-0 CR8 SH (SUTURE) ×2 IMPLANT
SYR CONTROL 10ML LL (SYRINGE) ×2 IMPLANT
TOWEL GREEN STERILE FF (TOWEL DISPOSABLE) ×2 IMPLANT

## 2019-11-25 NOTE — Op Note (Signed)
Preop diagnosis: Indwelling port a catheter for chemotherapy  Postop diagnosis: Same  Procedure: Removal of port a catheter  Surgeon: Cortavious Nix M.D.  Anesthesia: MAC with local  EBL: Minimal  Specimen none  Drains: None  Indications for procedure: The patient presents for removal of port a catheter after completing chemotherapy. The patient no longer requires central venous access. Risks of bleeding, infection, catheter fragmentation, embolization, arrhythmias and damage to arteries, veins and nerves and possibly other mediastinal structures discussed. The patient agrees to proceed.  Description of procedure: The patient was seen in the holding area. Questions were answered. The patient agreed to proceed. The patient was taken to the operating room. The patient was placed supine. Anesthesia was initiated. The skin on the upper chest was prepped and draped in a sterile fashion. Timeout was done. The patient received preoperative antibiotics. Incision was made through the old port site and the hub of the Port-A-Cath was seen. The sutures were cut to release the port from the chest wall. The catheter was removed in its entirety without difficulty. The tract was closed with 3-0 Vicryl. 4 Monocryl was used to close the skin. All final counts were correct. The patient was taken to recovery in satisfactory condition.  

## 2019-11-25 NOTE — Anesthesia Preprocedure Evaluation (Signed)
Anesthesia Evaluation  Patient identified by MRN, date of birth, ID band Patient awake    Reviewed: Allergy & Precautions, NPO status , Patient's Chart, lab work & pertinent test results  Airway Mallampati: II  TM Distance: >3 FB Neck ROM: Full    Dental no notable dental hx. (+) Teeth Intact, Dental Advisory Given   Pulmonary neg pulmonary ROS, former smoker,    Pulmonary exam normal breath sounds clear to auscultation       Cardiovascular negative cardio ROS Normal cardiovascular exam Rhythm:Regular Rate:Normal     Neuro/Psych  Headaches, PSYCHIATRIC DISORDERS Depression    GI/Hepatic negative GI ROS, Neg liver ROS,   Endo/Other  negative endocrine ROS  Renal/GU negative Renal ROS  negative genitourinary   Musculoskeletal negative musculoskeletal ROS (+)   Abdominal Normal abdominal exam  (+)   Peds negative pediatric ROS (+)  Hematology negative hematology ROS (+)   Anesthesia Other Findings   Reproductive/Obstetrics negative OB ROS                             Anesthesia Physical  Anesthesia Plan  ASA: II  Anesthesia Plan: MAC   Post-op Pain Management:    Induction: Intravenous  PONV Risk Score and Plan: 2 and Ondansetron, Midazolam and Treatment may vary due to age or medical condition  Airway Management Planned: Simple Face Mask  Additional Equipment: None  Intra-op Plan:   Post-operative Plan:   Informed Consent: I have reviewed the patients History and Physical, chart, labs and discussed the procedure including the risks, benefits and alternatives for the proposed anesthesia with the patient or authorized representative who has indicated his/her understanding and acceptance.     Dental advisory given  Plan Discussed with: CRNA  Anesthesia Plan Comments:         Anesthesia Quick Evaluation

## 2019-11-25 NOTE — Anesthesia Postprocedure Evaluation (Signed)
Anesthesia Post Note  Patient: Cindy [name]  Procedure(s) Performed: PORT REMOVAL (Right Chest)     Patient location during evaluation: PACU Anesthesia Type: MAC Level of consciousness: awake and alert Pain management: pain level controlled Vital Signs Assessment: post-procedure vital signs reviewed and stable Respiratory status: spontaneous breathing, nonlabored ventilation and respiratory function stable Cardiovascular status: blood pressure returned to baseline and stable Postop Assessment: no apparent nausea or vomiting Anesthetic complications: no   No complications documented.  Last Vitals:  Vitals:   11/25/19 0921 11/25/19 0945  BP:  122/60  Pulse: 74 80  Resp: 18 16  Temp:  37 C  SpO2: 99% 97%    Last Pain:  Vitals:   11/25/19 0945  TempSrc:   PainSc: 0-No pain                 Lynda Rainwater

## 2019-11-25 NOTE — Interval H&P Note (Signed)
History and Physical Interval Note:  11/25/2019 8:16 AM  Cindy [name]  has presented today for surgery, with the diagnosis of PORT-A-CATH IN PLACE.  The various methods of treatment have been discussed with the patient and family. After consideration of risks, benefits and other options for treatment, the patient has consented to  Procedure(s): PORT REMOVAL (N/A) as a surgical intervention.  The patient's history has been reviewed, patient examined, no change in status, stable for surgery.  I have reviewed the patient's chart and labs.  Questions were answered to the patient's satisfaction.     [place]

## 2019-11-25 NOTE — Transfer of Care (Signed)
Immediate Anesthesia Transfer of Care Note  Patient: Cindy [name]  Procedure(s) Performed: PORT REMOVAL (Right Chest)  Patient Location: PACU  Anesthesia Type:MAC  Level of Consciousness: awake  Airway & Oxygen Therapy: Patient Spontanous Breathing and Patient connected to face mask oxygen  Post-op Assessment: Report given to RN and Post -op Vital signs reviewed and stable  Post vital signs: Reviewed and stable  Last Vitals:  Vitals Value Taken Time  BP 108/57 11/25/19 0855  Temp    Pulse 92 11/25/19 0858  Resp 18 11/25/19 0858  SpO2 100 % 11/25/19 0858  Vitals shown include unvalidated device data.  Last Pain:  Vitals:   11/25/19 0711  TempSrc: Oral  PainSc: 0-No pain         Complications: No complications documented.

## 2019-11-25 NOTE — H&P (Signed)
Junius Creamer  Location: [facility] Patient #: [id number] DOB: 08-09-1950 Single / Language: [name] / Race: White Female  History of Present Illness Patient words: Patient returns for follow-up after breast conserving surgery of her left breast and subsequent postoperative chemotherapy. She is done with chemotherapy. She will begin radiation therapy soon. She is weak but overall has done well with chemotherapy.  The patient is a 69 year old female.   Allergies Bromfed *COUGH/COLD/ALLERGY* Shortness of breath. Venlafaxine HCl *ANTIDEPRESSANTS* Does not remember reaction Allergies Reconciled  Medication History  FLUoxetine HCl (40MG  Capsule, Oral) Active. Xanax (0.25MG  Tablet, Oral) Active. Medications Reconciled    Vitals 09/22/2019 1:41 PM Weight: 146 lb Height: 64in Body Surface Area: 1.71 m Body Mass Index: 25.06 kg/m  Temp.: 97.20F  Pulse: 122 (Regular)  BP: 124/74 (Sitting, Left Arm, Standard)        Physical Exam   General Mental Status-Alert. General Appearance-Consistent with stated age. Hydration-Well hydrated. Voice-Normal.  Breast Note: Left breast shows postoperative change without mass lesion. Incisions are healed. Right upper chest port noted.  Neurologic Neurologic evaluation reveals -alert and oriented x 3 with no impairment of recent or remote memory. Mental Status-Normal.  Lymphatic Axillary  General Axillary Region: Bilateral - Description - Normal. Tenderness - Non Tender.    Assessment & Plan   BREAST CANCER, LEFT (C50.912) Impression: port removal in june doing well  Total time 20 minutes for examination, review of records, discussion of treatment options, discussion of port removal and surgical complications as well as alternatives to surgery and timing  Current Plans Pt Education - [facility] Free Text Education/Instructions: discussed with patient and  provided information. I recommended surgery to remove the catheter. I explained the technique of removal with use of local anesthesia & possible need for more aggressive sedation/anesthesia for patient comfort.  Risks such as bleeding, infection, and other risks were discussed. Post-operative dressing/incision care was discussed. I noted a good likelihood this will help address the problem. We will work to minimize complications. Questions were answered. The patient expresses understanding & wishes to proceed with surgery.

## 2019-11-25 NOTE — Discharge Instructions (Signed)
GENERAL SURGERY: POST OP INSTRUCTIONS  ######################################################################  EAT Gradually transition to a high fiber diet with a fiber supplement over the next few weeks after discharge.  Start with a pureed / full liquid diet (see below)  WALK Walk an hour a day.  Control your pain to do that.    CONTROL PAIN Control pain so that you can walk, sleep, tolerate sneezing/coughing, go up/down stairs.  HAVE A BOWEL MOVEMENT DAILY Keep your bowels regular to avoid problems.  OK to try a laxative to override constipation.  OK to use an antidairrheal to slow down diarrhea.  Call if not better after 2 tries  CALL IF YOU HAVE PROBLEMS/CONCERNS Call if you are still struggling despite following these instructions. Call if you have concerns not answered by these instructions  ######################################################################    1. DIET: Follow a light bland diet & liquids the first 24 hours after arrival home, such as soup, liquids, starches, etc.  Be sure to drink plenty of fluids.  Quickly advance to a usual solid diet within a few days.  Avoid fast food or heavy meals as your are more likely to get nauseated or have irregular bowels.  A low-fat, high-fiber diet for the rest of your life is ideal.   2. Take your usually prescribed home medications unless otherwise directed. 3. PAIN CONTROL: a. Pain is best controlled by a usual combination of three different methods TOGETHER: i. Ice/Heat ii. Over the counter pain medication iii. Prescription pain medication b. Most patients will experience some swelling and bruising around the incisions.  Ice packs or heating pads (30-60 minutes up to 6 times a day) will help. Use ice for the first few days to help decrease swelling and bruising, then switch to heat to help relax tight/sore spots and speed recovery.  Some people prefer to use ice alone, heat alone, alternating between ice & heat.   Experiment to what works for you.  Swelling and bruising can take several weeks to resolve.   c. It is helpful to take an over-the-counter pain medication regularly for the first few weeks.  Choose one of the following that works best for you: i. Naproxen (Aleve, etc)  Two 220mg tabs twice a day ii. Ibuprofen (Advil, etc) Three 200mg tabs four times a day (every meal & bedtime) iii. Acetaminophen (Tylenol, etc) 500-650mg four times a day (every meal & bedtime) d. A  prescription for pain medication (such as oxycodone, hydrocodone, etc) should be given to you upon discharge.  Take your pain medication as prescribed.  i. If you are having problems/concerns with the prescription medicine (does not control pain, nausea, vomiting, rash, itching, etc), please call us [phone] to see if we need to switch you to a different pain medicine that will work better for you and/or control your side effect better. ii. If you need a refill on your pain medication, please contact your pharmacy.  They will contact our office to request authorization. Prescriptions will not be filled after 5 pm or on week-ends. 4. Avoid getting constipated.  Between the surgery and the pain medications, it is common to experience some constipation.  Increasing fluid intake and taking a fiber supplement (such as Metamucil, Citrucel, FiberCon, MiraLax, etc) 1-2 times a day regularly will usually help prevent this problem from occurring.  A mild laxative (prune juice, Milk of Magnesia, MiraLax, etc) should be taken according to package directions if there are no bowel movements after 48 hours.   5. Wash /   shower every day.  You may shower over the dressings as they are waterproof.  Continue to shower over incision(s) after the dressing is off. 6. Remove your waterproof bandages 5 days after surgery.  You may leave the incision open to air.  You may have skin tapes (Steri Strips) covering the incision(s).  Leave them on until one week, then  remove.  You may replace a dressing/Band-Aid to cover the incision for comfort if you wish.      7. ACTIVITIES as tolerated:   a. You may resume regular (light) daily activities beginning the next day--such as daily self-care, walking, climbing stairs--gradually increasing activities as tolerated.  If you can walk 30 minutes without difficulty, it is safe to try more intense activity such as jogging, treadmill, bicycling, low-impact aerobics, swimming, etc. b. Save the most intensive and strenuous activity for last such as sit-ups, heavy lifting, contact sports, etc  Refrain from any heavy lifting or straining until you are off narcotics for pain control.   c. DO NOT PUSH THROUGH PAIN.  Let pain be your guide: If it hurts to do something, don't do it.  Pain is your body warning you to avoid that activity for another week until the pain goes down. d. You may drive when you are no longer taking prescription pain medication, you can comfortably wear a seatbelt, and you can safely maneuver your car and apply brakes. e. You may have sexual intercourse when it is comfortable.  8. FOLLOW UP in our office a. Please call [facility] at [phone] to set up an appointment to see your surgeon in the office for a follow-up appointment approximately 2-3 weeks after your surgery. b. Make sure that you call for this appointment the day you arrive home to insure a convenient appointment time. 9. IF YOU HAVE DISABILITY OR FAMILY LEAVE FORMS, BRING THEM TO THE OFFICE FOR PROCESSING.  DO NOT GIVE THEM TO YOUR DOCTOR.   WHEN TO CALL US [phone]: 1. Poor pain control 2. Reactions / problems with new medications (rash/itching, nausea, etc)  3. Fever over 101.5 F (38.5 C) 4. Worsening swelling or bruising 5. Continued bleeding from incision. 6. Increased pain, redness, or drainage from the incision 7. Difficulty breathing / swallowing   The clinic staff is available to answer your questions during regular  business hours (8:30am-5pm).  Please don't hesitate to call and ask to speak to one of our nurses for clinical concerns.   If you have a medical emergency, go to the nearest emergency room or call 911.  A surgeon from [facility] is always on call at the hospitals   [facility] [address], [address], [place], [place]  [address] ? MAIN: [phone] ? TOLL FREE: [phone] ?  FAX [phone] [email or web address]   Post Anesthesia Home Care Instructions  Activity: Get plenty of rest for the remainder of the day. A responsible individual must stay with you for 24 hours following the procedure.  For the next 24 hours, DO NOT: -Drive a car -Operate machinery -Drink alcoholic beverages -Take any medication unless instructed by your physician -Make any legal decisions or sign important papers.  Meals: Start with liquid foods such as gelatin or soup. Progress to regular foods as tolerated. Avoid greasy, spicy, heavy foods. If nausea and/or vomiting occur, drink only clear liquids until the nausea and/or vomiting subsides. Call your physician if vomiting continues.  Special Instructions/Symptoms: Your throat may feel dry or sore   from the anesthesia or the breathing tube placed in your throat during surgery. If this causes discomfort, gargle with warm salt water. The discomfort should disappear within 24 hours.  If you had a scopolamine patch placed behind your ear for the management of post- operative nausea and/or vomiting:  1. The medication in the patch is effective for 72 hours, after which it should be removed.  Wrap patch in a tissue and discard in the trash. Wash hands thoroughly with soap and water. 2. You may remove the patch earlier than 72 hours if you experience unpleasant side effects which may include dry mouth, dizziness or visual disturbances. 3. Avoid touching the patch. Wash your hands with soap and water after contact  with the patch.      

## 2019-11-26 ENCOUNTER — Encounter (HOSPITAL_BASED_OUTPATIENT_CLINIC_OR_DEPARTMENT_OTHER): Payer: Self-pay | Admitting: Surgery

## 2019-12-07 NOTE — Progress Notes (Incomplete)
  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 She did report some mild fatigue and some shortness of breath at rest but not with exertion. The shortness of breath initially onset after receiving chemotherapy. She denied chest pain, swelling of the left breast/axilla, or issues with range of motion. The left breast showed some minimal erythema and hyperpigmentation changes during the third week of treatment. There were no signs of infection or yeast at that time. Towards the end of treatment, the patient began to report a raised erythematous rash on her left chest. 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  Plan: The patient will follow-up with radiation oncology in one month.  ________________________________________________   Blair Promise, PhD, MD  This document serves as a record of services personally performed by Gery Pray, MD. It was created on his behalf by  Clerance Lav, a trained medical scribe. The creation of this record is based on the scribe's personal observations and the provider's statements to them. This document has been checked and approved by the attending provider.

## 2019-12-17 ENCOUNTER — Ambulatory Visit: Payer: Self-pay | Admitting: Radiation Oncology

## 2019-12-21 ENCOUNTER — Ambulatory Visit
Admission: RE | Admit: 2019-12-21 | Discharge: 2019-12-21 | Disposition: A | Discharge status: Home / Self Care | Payer: PPO | Source: Ambulatory Visit | Attending: Radiation Oncology | Admitting: Radiation Oncology

## 2019-12-21 ENCOUNTER — Other Ambulatory Visit: Payer: Self-pay

## 2019-12-21 ENCOUNTER — Encounter: Payer: Self-pay | Admitting: Radiation Oncology

## 2019-12-21 VITALS — BP 115/72 | HR 96 | Temp 98.0°F | Resp 18 | Ht 64.0 in | Wt 141.8 lb

## 2019-12-21 DIAGNOSIS — Z171 Estrogen receptor negative status [ER-]: Secondary | ICD-10-CM | POA: Diagnosis not present

## 2019-12-21 DIAGNOSIS — Z79899 Other long term (current) drug therapy: Secondary | ICD-10-CM | POA: Insufficient documentation

## 2019-12-21 DIAGNOSIS — Z923 Personal history of irradiation: Secondary | ICD-10-CM | POA: Diagnosis not present

## 2019-12-21 DIAGNOSIS — D649 Anemia, unspecified: Secondary | ICD-10-CM | POA: Diagnosis not present

## 2019-12-21 DIAGNOSIS — C50212 Malignant neoplasm of upper-inner quadrant of left female breast: Secondary | ICD-10-CM | POA: Diagnosis not present

## 2019-12-21 NOTE — Progress Notes (Signed)
Radiation Oncology         [phone] ________________________________  Name: Cindy [name] MRN: [id number]  Date: 12/21/2019  DOB: 02/12/1951  Follow-Up Visit Note  CC: Mosie Lukes, MD  Truitt Merle, MD    ICD-10-CM   1. Malignant neoplasm of upper-inner quadrant of left breast in female, estrogen receptor negative ([place])  C50.212    Z17.1     Diagnosis: StagepT2, pN0LeftBreast UIQ,Invasive DuctalCarcinoma, ER-/ PR-/ Her2-, Grade 3  Interval Since Last Radiation: One month, two weeks, and two days.  Radiation Treatment Dates: 10/07/2019 through 11/04/2019 Site Technique Total Dose (Gy) Dose per Fx (Gy) Completed Fx Beam Energies  Breast, Left: Breast_Lt 3D 40.05/40.05 2.67 15/15 6X, 10X  Breast, Left: Breast_Lt_Bst 3D 10/10 2 5/5 6X, 10X    Narrative:  The patient returns today for routine follow-up. Since the end of treatment, she was seen by Dr. Burr Medico on 11/16/2019. At that time, her anemia had improved and there was no clinical concern for recurrence. She will proceed with surveillance.    On review of systems, she reports a persistent cough that started during radiation therapy.. She denies denies any chills or fever.  She denies any pain in sputum.  Cough is associated with clear phlegm she denies any shortness of breath or pain within the chest area.  Denies any pain in her left breast.  Denies any nipple discharge or problems with swelling in her left arm or hand..  ALLERGIES:  is allergic to bromfed and venlafaxine.  Meds: Current Outpatient Medications  Medication Sig Dispense Refill  . Ascorbic Acid (VITAMIN C PO) Take by mouth.    Marland Kitchen BIOTIN PO Take by mouth.    . cholecalciferol (VITAMIN D3) 25 MCG (1000 UNIT) tablet Take 1,000 Units by mouth daily.    Marland Kitchen FLUoxetine (PROZAC) 40 MG capsule Take 1 capsule (40 mg total) by mouth daily. 90 capsule 2  . Multiple Vitamin (MULTIVITAMIN WITH MINERALS) TABS tablet Take 1 tablet by mouth daily.    Marland Kitchen  lidocaine-prilocaine (EMLA) cream Apply to affected area once 30 g 3  . ondansetron (ZOFRAN) 8 MG tablet Take 1 tablet (8 mg total) by mouth 2 (two) times daily as needed. Start on the third day after chemotherapy. (Patient not taking: Reported on 09/30/2019) 30 tablet 2  . prochlorperazine (COMPAZINE) 10 MG tablet Take 1 tablet (10 mg total) by mouth every 6 (six) hours as needed (Nausea or vomiting). (Patient not taking: Reported on 09/30/2019) 30 tablet 2   No current facility-administered medications for this encounter.    Physical Findings: The patient is in no acute distress. Patient is alert and oriented.  height is 5' 4"  (1.626 m) and weight is 141 lb 12.8 oz (64.3 kg). Her temperature is 98 F (36.7 C). Her blood pressure is 115/72 and her pulse is 96. Her respiration is 18 and oxygen saturation is 97%.     Lungs are clear to auscultation bilaterally. Heart has regular rate and rhythm. No palpable cervical, supraclavicular, or axillary adenopathy. Abdomen soft, non-tender, normal bowel sounds. Right breast: No palpable mass, nipple discharge, or bleeding.  Left breast: Skin is healed well.  No dominant mass appreciated in the breast nipple discharge or bleeding.  Mild hyperpigmentation changes noted  Lab Findings: Lab Results  Component Value Date   WBC 5.6 11/16/2019   HGB 11.9 (L) 11/16/2019   HCT 36.4 11/16/2019   MCV 95.8 11/16/2019   PLT 237 11/16/2019    Radiographic Findings: No  results found.  Impression: StagepT2, pN0LeftBreast UIQ,Invasive DuctalCarcinoma, ER-/ PR-/ Her2-, Grade 3  The patient is recovering from the effects of radiation.  As above the patient reports of persistent primarily dry cough.  We talked about consideration for steroids but at this point the patient feels her cough is improving and does not wish to consider any cough suppressant or steroids in the event that she may have some mild radiation pneumonitis.  Patient will call back if she  wishes to proceed with this therapy.  Plan: The patient is scheduled for a diagnostic bilateral mammogram on 01/29/2020. She is scheduled to follow-up with Dr. Burr Medico on 03/18/2020.  As needed follow-up in radiation oncology.  As above the patient will call back if she wishes to proceed with a short course of steroids to see if this will help with her coughing issues.  She does not wish to consider chest x-ray for further evaluation as she feels she is slowly improving.   ____________________________________   Blair Promise, PhD, MD  This document serves as a record of services personally performed by Gery Pray, MD. It was created on his behalf by Clerance Lav, a trained medical scribe. The creation of this record is based on the scribe's personal observations and the provider's statements to them. This document has been checked and approved by the attending provider.

## 2019-12-21 NOTE — Progress Notes (Signed)
Patient presents today for a follow-up for breast cancer. Patient denies having any pain or fatigue. Patient states having a cough that was acquired during radiation treatments. Patient states that the cough is productive occasionally. Patient states having shortness of breath since chemotherapy treatment on exertion. Patient states that she walking on the treadmill for about 20 minutes. Patient states that her skin is still dark and is using lotion on her skin.   BP 115/72 (BP Location: Right Arm, Patient Position: Sitting, Cuff Size: Normal)   Pulse 96   Temp 98 F (36.7 C)   Resp 18   Ht 5\' 4"  (1.626 m)   Wt 141 lb 12.8 oz (64.3 kg)   SpO2 97%   BMI 24.34 kg/m

## 2020-01-29 ENCOUNTER — Other Ambulatory Visit: Payer: Self-pay | Admitting: Hematology

## 2020-01-29 ENCOUNTER — Other Ambulatory Visit: Payer: Self-pay

## 2020-01-29 ENCOUNTER — Ambulatory Visit
Admission: RE | Admit: 2020-01-29 | Discharge: 2020-01-29 | Disposition: A | Discharge status: Home / Self Care | Payer: PPO | Source: Ambulatory Visit | Attending: Hematology | Admitting: Hematology

## 2020-01-29 DIAGNOSIS — Z9889 Other specified postprocedural states: Secondary | ICD-10-CM

## 2020-02-17 ENCOUNTER — Telehealth: Payer: Self-pay | Admitting: Hematology

## 2020-02-17 NOTE — Telephone Encounter (Signed)
Rescheduled appts on 10/8 to 10/7. Provider on PAL. Unable to reach pt. Left voicemail with appt time and date.

## 2020-02-17 NOTE — Telephone Encounter (Signed)
Spoke with pt to reschedule appts on 10/8. Provider on PAL. Pt stated that she is unable to come in for visit due to insurance purposes. Pt will call office back to reschedule appts.

## 2020-02-19 ENCOUNTER — Other Ambulatory Visit: Payer: Self-pay

## 2020-02-19 ENCOUNTER — Ambulatory Visit
Admission: RE | Admit: 2020-02-19 | Discharge: 2020-02-19 | Disposition: A | Discharge status: Home / Self Care | Payer: PPO | Source: Ambulatory Visit | Attending: Hematology | Admitting: Hematology

## 2020-02-19 DIAGNOSIS — Z9889 Other specified postprocedural states: Secondary | ICD-10-CM

## 2020-02-19 DIAGNOSIS — Z853 Personal history of malignant neoplasm of breast: Secondary | ICD-10-CM | POA: Diagnosis not present

## 2020-02-19 DIAGNOSIS — R922 Inconclusive mammogram: Secondary | ICD-10-CM | POA: Diagnosis not present

## 2020-03-16 NOTE — Progress Notes (Signed)
[name]   [phone]) [phone] Fax:(336) [phone]   Clinic Follow up Note   Patient Care Team: Mosie Lukes, MD as PCP - General (Family Medicine) Cherylann Banas, Cherly Anderson, MD (Inactive) (Obstetrics and Gynecology) Rockwell Germany, RN as Oncology Nurse Navigator Mauro Kaufmann, RN as Oncology Nurse Navigator Erroll Luna, MD as Consulting Physician (General Surgery) Truitt Merle, MD as Consulting Physician (Hematology) Gery Pray, MD as Consulting Physician (Radiation Oncology)  Date of Service:  03/17/2020  CHIEF COMPLAINT: F/u of left breast cancer  SUMMARY OF ONCOLOGIC HISTORY: Oncology History Overview Note  Cancer Staging Malignant neoplasm of upper-inner quadrant of left breast in female, estrogen receptor negative ([place]) Staging form: Breast, AJCC 8th Edition - Clinical stage from 02/06/2019: Stage IB (cT1c, cN0, cM0, G2, ER-, PR-, HER2-) - Signed by Truitt Merle, MD on 02/17/2019 - Pathologic stage from 03/10/2019: Stage IIA (pT2, pN0, cM0, G3, ER-, PR-, HER2-) - Signed by Truitt Merle, MD on 03/12/2019    Malignant neoplasm of upper-inner quadrant of left breast in female, estrogen receptor negative ([place])  02/04/2019 Mammogram   Diagnostic mammogram 02/04/19  IMPRESSION: Highly suspicious 1.7 x1.5 x1.7cm at 10:00 position of UPPER INNER LEFT breast mass 10 cm from nipple corresponding to the screening study finding. Tissue sampling recommended.   No abnormal LEFT axillary lymph nodes.   02/06/2019 Cancer Staging   Staging form: Breast, AJCC 8th Edition - Clinical stage from 02/06/2019: Stage IB (cT1c, cN0, cM0, G2, ER-, PR-, HER2-) - Signed by Truitt Merle, MD on 02/17/2019   02/06/2019 Initial Biopsy   Diagnosis 02/06/19 Breast, left, needle core biopsy, 10 o'clock, 10cm/n, ribbon clip - INVASIVE MAMMARY CARCINOMA.   02/06/2019 Receptors her2   Results: IMMUNOHISTOCHEMICAL AND MORPHOMETRIC ANALYSIS PERFORMED MANUALLY The tumor cells are NEGATIVE for Her2  (1+). Estrogen Receptor: 0%, NEGATIVE Progesterone Receptor: 0%, NEGATIVE Proliferation Marker Ki67: 40%   02/17/2019 Initial Diagnosis   Malignant neoplasm of upper-inner quadrant of left breast in female, estrogen receptor negative ([place])   03/10/2019 Cancer Staging   Staging form: Breast, AJCC 8th Edition - Pathologic stage from 03/10/2019: Stage IIA (pT2, pN0, cM0, G3, ER-, PR-, HER2-) - Signed by Truitt Merle, MD on 03/12/2019   03/10/2019 Surgery   LEFT BREAST LUMPECTOMY WITH RADIOACTIVE SEED AND LEFT SENTINEL LYMPH NODE MAPPING and PAC placement  by Dr. Brantley Stage  03/10/19   03/10/2019 Pathology Results   DIAGNOSIS: 03/10/19  A. BREAST, LEFT, LUMPECTOMY:  - Invasive ductal carcinoma, grade 3, 2.5 cm  - Carcinoma is 0.3 cm from superior margin  - Medial resection edge is 0.1 cm from carcinoma (final medial margin is  represented by part 2 which is negative for carcinoma)  - Negative for lymphovascular or perineural invasion  - Biopsy site changes  - See oncology table   B. BREAST, LEFT MEDIAL MARGIN, EXCISION:  - Benign breast parenchyma, negative for carcinoma   C. LYMPH NODE, LEFT AXILLARY, SENTINEL BIOPSY:  - Lymph node, negative for carcinoma (0/1)    04/06/2019 Imaging   Bone scan IMPRESSION: Mild focus of uptake is seen involving the anterior portion of left upper rib, but there is no corresponding abnormality seen on the CT scan of the same day. Potentially this may represent degenerative change or secondary to overlying postsurgical change. Attention on follow-up is recommended.   No definite scintigraphic evidence of osseous metastases.   04/06/2019 Imaging   CT CAP IMPRESSION: 1. Surgical changes left breast with borderline enlarged left axillary lymph node. No  other definite evidence for metastatic disease in the chest, abdomen, or pelvis. 2. Tiny bilateral pulmonary nodules. Attention on follow-up recommended. 3. Scattered small hypoattenuating lesions in the  liver parenchyma. Some of these have attenuation higher than would be expected for simple fluid and may be cysts complicated by proteinaceous debris or hemorrhage. MRI without and with contrast recommended to further evaluate. 4. Small to moderate hiatal hernia. 5. Aortic Atherosclerosis (ICD10-I70.0).   04/08/2019 - 09/14/2019 Chemotherapy   chemo AC q2weeks for 4 cycles starting 04/08/19-05/20/19 followed by Taxol weekly for 12 weeks starting 06/09/19.     09/16/2019 Imaging   CT CAP w contrast IMPRESSION: 1. No evidence for metastatic disease in the chest, abdomen, or pelvis. 2. No change in the tiny bilateral pulmonary nodules, likely benign. Attention on follow-up suggested. 3. Stable appearance of multiple hypoattenuating liver lesions. None of these lesions show suspicious enhancement on today's study in there probably a combination of simple and complex cysts. There are several tiny liver lesions too small to characterize on today's study. Continued attention on follow-up recommended. 4. Small hiatal hernia. 5.  Aortic Atherosclerois (ICD10-170.0)   10/07/2019 - 11/04/2019 Radiation Therapy   Adjuvant RT with Dr Sondra Come 10/07/19-11/04/19   11/25/2019 Procedure   PAC removed by Dr Brantley Stage      CURRENT THERAPY:  Surveillance   INTERVAL HISTORY:  Cindy [name] is here for a follow up of left breast cancer. She presents to the clinic alone. She is doing well overall. Her fatigue from previous chemo and RT has much improved, she is eating well. She developed mild tightness along the left inner arm, axillary lateral breast when she stretches, no significant neck pain or edema.  Her previous cough has finally resolved.  She denies any new pain, abdominal discomfort or other new symptoms.  Weight is stable.`  All other systems were reviewed with the patient and are negative.  MEDICAL HISTORY:  Past Medical History:  Diagnosis Date  . Allergy   . Depression    ? if bipolar  works the best  . Elevated fasting blood sugar    hx  . H/O measles   . Headache   . Hiatal hernia   . History of chicken pox   . Hx of migraines   . Microscopic hematuria    advised to see urology ? never went  . Preventative health care 06/20/2011  . Radiation dermatitis    chest    SURGICAL HISTORY: Past Surgical History:  Procedure Laterality Date  . BREAST LUMPECTOMY WITH RADIOACTIVE SEED AND SENTINEL LYMPH NODE BIOPSY Left 03/10/2019   Procedure: LEFT BREAST LUMPECTOMY WITH RADIOACTIVE SEED AND LEFT SENTINEL LYMPH NODE MAPPING;  Surgeon: Erroll Luna, MD;  Location: [facility];  Service: General;  Laterality: Left;  . endometrial polyps     2 surgeries by Dr Cathlean Cower  . EYE SURGERY  04/29/2009   Rt eye tighten up  . PORT-A-CATH REMOVAL Right 11/25/2019   Procedure: PORT REMOVAL;  Surgeon: Erroll Luna, MD;  Location: [facility];  Service: General;  Laterality: Right;  . PORTACATH PLACEMENT N/A 03/10/2019   Procedure: INSERTION PORT-A-CATH WITH ULTRASOUND;  Surgeon: Erroll Luna, MD;  Location: [facility];  Service: General;  Laterality: N/A;  . steel plate in skull Left    from a crushed skull    I have reviewed the social history and family history with the patient and they are unchanged from previous note.  ALLERGIES:  is allergic to bromfed and  venlafaxine.  MEDICATIONS:  Current Outpatient Medications  Medication Sig Dispense Refill  . Ascorbic Acid (VITAMIN C PO) Take by mouth.    Marland Kitchen BIOTIN PO Take by mouth.    . cholecalciferol (VITAMIN D3) 25 MCG (1000 UNIT) tablet Take 1,000 Units by mouth daily.    Marland Kitchen FLUoxetine (PROZAC) 40 MG capsule Take 1 capsule (40 mg total) by mouth daily. 90 capsule 2  . lidocaine-prilocaine (EMLA) cream Apply to affected area once 30 g 3  . Multiple Vitamin (MULTIVITAMIN WITH MINERALS) TABS tablet Take 1 tablet by mouth daily.    . ondansetron (ZOFRAN) 8 MG tablet Take 1 tablet (8 mg total) by mouth 2 (two) times daily as  needed. Start on the third day after chemotherapy. (Patient not taking: Reported on 09/30/2019) 30 tablet 2  . prochlorperazine (COMPAZINE) 10 MG tablet Take 1 tablet (10 mg total) by mouth every 6 (six) hours as needed (Nausea or vomiting). (Patient not taking: Reported on 09/30/2019) 30 tablet 2   No current facility-administered medications for this visit.    PHYSICAL EXAMINATION: ECOG PERFORMANCE STATUS: 0 - Asymptomatic  Vitals:   03/17/20 1406  BP: 118/69  Pulse: 84  Resp: 18  Temp: 97.8 F (36.6 C)  SpO2: 97%   Filed Weights   03/17/20 1406  Weight: 144 lb 14.4 oz (65.7 kg)    GENERAL:alert, no distress and comfortable SKIN: skin color, texture, turgor are normal, no rashes or significant lesions EYES: normal, Conjunctiva are pink and non-injected, sclera clear NECK: supple, thyroid normal size, non-tender, without nodularity LYMPH:  no palpable lymphadenopathy in the cervical, axillary  LUNGS: clear to auscultation and percussion with normal breathing effort HEART: regular rate & rhythm and no murmurs and no lower extremity edema ABDOMEN:abdomen soft, non-tender and normal bowel sounds Musculoskeletal:no cyanosis of digits and no clubbing  NEURO: alert & oriented x 3 with fluent speech, no focal motor/sensory deficits Breasts: Breast inspection showed them to be symmetrical with no nipple discharge. Palpation of the breasts and axilla revealed no obvious mass that I could appreciate.  Surgical incision in left breast healed very well with no scar tissues.  LABORATORY DATA:  I have reviewed the data as listed CBC Latest Ref Rng & Units 03/17/2020 11/16/2019 09/14/2019  WBC 4.0 - 10.5 K/uL 5.5 5.6 3.6(L)  Hemoglobin 12.0 - 15.0 g/dL 13.3 11.9(L) 11.0(L)  Hematocrit 36 - 46 % 39.6 36.4 32.9(L)  Platelets 150 - 400 K/uL 211 237 221     CMP Latest Ref Rng & Units 03/17/2020 11/16/2019 09/14/2019  Glucose 70 - 99 mg/dL 112(H) 106(H) 105(H)  BUN 8 - 23 mg/dL 22 22 17   Creatinine  0.44 - 1.00 mg/dL 0.82 0.81 0.77  Sodium 135 - 145 mmol/L 138 140 140  Potassium 3.5 - 5.1 mmol/L 4.4 4.4 3.9  Chloride 98 - 111 mmol/L 103 104 107  CO2 22 - 32 mmol/L 31 27 23   Calcium 8.9 - 10.3 mg/dL 9.8 9.5 9.1  Total Protein 6.5 - 8.1 g/dL 7.1 6.7 6.3(L)  Total Bilirubin 0.3 - 1.2 mg/dL 0.5 0.3 0.4  Alkaline Phos 38 - 126 U/L 71 70 60  AST 15 - 41 U/L 17 20 16   ALT 0 - 44 U/L 17 21 19       RADIOGRAPHIC STUDIES: I have personally reviewed the radiological images as listed and agreed with the findings in the report. No results found.   ASSESSMENT & PLAN:  JANAN BOGIE is a 69 y.o. female with  1.Malignant neoplasm of upper-inner quadrant of left breast, StageIIA,p(T2N0M0), ER/PR/HER2-,triple negative,GradeIII -She was diagnosed in 02/2019. She underwent left breast lumpectomy with SLNB on 03/10/19. -Given her aggressive triple negative breast cancer,she was treated with adjuvantchemoAC-T which she completed in early 09/2019. She completed Adjuvant RT with Dr Sondra Come 10/07/19-11/04/19.  -She has recovered well from previous treatment, with residual mild fatigue, otherwise asymptomatic. -Lab reviewed, anemia resolved, CBC and CMP are unremarkable, exam was normal, no clinical concern -Recent diagnostic mammogram was unremarkable -Continue breast cancer surveillance, follow-up in 4 months  2. Genetic Testing: has not proceeded  3.Anemia -Received blood transfusion in 05/2019. Has much improved off chemo.  -resolved now   4. Acid reflux, Hiatal hernia  -Does cause her to cough.  -ContinuePrilosec OTC.     PLAN: -Lab reviewed, she is doing well -Continue breast cancer surveillance, lab CBC and CMP and follow-up in 4 months, okay not to repeat lab if she has lab appointment with her primary care physician around next visit   No problem-specific Assessment & Plan notes found for this encounter.   No orders of the defined types were placed in this  encounter.  All questions were answered. The patient knows to call the clinic with any problems, questions or concerns. No barriers to learning was detected. The total time spent in the appointment was 25 minutes.     Truitt Merle, MD 03/17/2020   I, Joslyn Devon, am acting as scribe for Truitt Merle, MD.   I have reviewed the above documentation for accuracy and completeness, and I agree with the above.

## 2020-03-17 ENCOUNTER — Inpatient Hospital Stay (HOSPITAL_BASED_OUTPATIENT_CLINIC_OR_DEPARTMENT_OTHER): Payer: PPO | Admitting: Hematology

## 2020-03-17 ENCOUNTER — Inpatient Hospital Stay: Payer: PPO | Attending: Hematology | Admitting: Internal Medicine

## 2020-03-17 ENCOUNTER — Encounter: Payer: Self-pay | Admitting: Hematology

## 2020-03-17 ENCOUNTER — Other Ambulatory Visit: Payer: Self-pay

## 2020-03-17 VITALS — BP 118/69 | HR 84 | Temp 97.8°F | Resp 18 | Ht 64.0 in | Wt 144.9 lb

## 2020-03-17 DIAGNOSIS — Z923 Personal history of irradiation: Secondary | ICD-10-CM | POA: Diagnosis not present

## 2020-03-17 DIAGNOSIS — I700 Atherosclerosis of aorta: Secondary | ICD-10-CM | POA: Insufficient documentation

## 2020-03-17 DIAGNOSIS — F32A Depression, unspecified: Secondary | ICD-10-CM | POA: Diagnosis not present

## 2020-03-17 DIAGNOSIS — Z9221 Personal history of antineoplastic chemotherapy: Secondary | ICD-10-CM | POA: Diagnosis not present

## 2020-03-17 DIAGNOSIS — K449 Diaphragmatic hernia without obstruction or gangrene: Secondary | ICD-10-CM | POA: Diagnosis not present

## 2020-03-17 DIAGNOSIS — Z171 Estrogen receptor negative status [ER-]: Secondary | ICD-10-CM | POA: Diagnosis not present

## 2020-03-17 DIAGNOSIS — Z79899 Other long term (current) drug therapy: Secondary | ICD-10-CM | POA: Insufficient documentation

## 2020-03-17 DIAGNOSIS — C50212 Malignant neoplasm of upper-inner quadrant of left female breast: Secondary | ICD-10-CM | POA: Diagnosis not present

## 2020-03-17 LAB — CBC WITH DIFFERENTIAL (CANCER CENTER ONLY)
Abs Immature Granulocytes: 0.01 10*3/uL (ref 0.00–0.07)
Basophils Absolute: 0 10*3/uL (ref 0.0–0.1)
Basophils Relative: 1 %
Eosinophils Absolute: 0.1 10*3/uL (ref 0.0–0.5)
Eosinophils Relative: 2 %
HCT: 39.6 % (ref 36.0–46.0)
Hemoglobin: 13.3 g/dL (ref 12.0–15.0)
Immature Granulocytes: 0 %
Lymphocytes Relative: 32 %
Lymphs Abs: 1.7 10*3/uL (ref 0.7–4.0)
MCH: 31.3 pg (ref 26.0–34.0)
MCHC: 33.6 g/dL (ref 30.0–36.0)
MCV: 93.2 fL (ref 80.0–100.0)
Monocytes Absolute: 0.4 10*3/uL (ref 0.1–1.0)
Monocytes Relative: 8 %
Neutro Abs: 3.2 10*3/uL (ref 1.7–7.7)
Neutrophils Relative %: 57 %
Platelet Count: 211 10*3/uL (ref 150–400)
RBC: 4.25 MIL/uL (ref 3.87–5.11)
RDW: 13.5 % (ref 11.5–15.5)
WBC Count: 5.5 10*3/uL (ref 4.0–10.5)
nRBC: 0 % (ref 0.0–0.2)

## 2020-03-17 LAB — CMP (CANCER CENTER ONLY)
ALT: 17 U/L (ref 0–44)
AST: 17 U/L (ref 15–41)
Albumin: 3.7 g/dL (ref 3.5–5.0)
Alkaline Phosphatase: 71 U/L (ref 38–126)
Anion gap: 4 — ABNORMAL LOW (ref 5–15)
BUN: 22 mg/dL (ref 8–23)
CO2: 31 mmol/L (ref 22–32)
Calcium: 9.8 mg/dL (ref 8.9–10.3)
Chloride: 103 mmol/L (ref 98–111)
Creatinine: 0.82 mg/dL (ref 0.44–1.00)
GFR, Estimated: >60 mL/min
Glucose, Bld: 112 mg/dL — ABNORMAL HIGH (ref 70–99)
Potassium: 4.4 mmol/L (ref 3.5–5.1)
Sodium: 138 mmol/L (ref 135–145)
Total Bilirubin: 0.5 mg/dL (ref 0.3–1.2)
Total Protein: 7.1 g/dL (ref 6.5–8.1)

## 2020-03-18 ENCOUNTER — Other Ambulatory Visit: Payer: PPO

## 2020-03-18 ENCOUNTER — Ambulatory Visit: Payer: PPO | Admitting: Hematology

## 2020-03-21 ENCOUNTER — Encounter: Payer: PPO | Admitting: Family Medicine

## 2020-03-22 ENCOUNTER — Telehealth: Payer: Self-pay | Admitting: Hematology

## 2020-03-22 NOTE — Telephone Encounter (Signed)
Scheduled appointment per 10/12 scheduling message. Spoke to patient who is aware of appointment date and time.

## 2020-04-25 ENCOUNTER — Telehealth: Payer: Self-pay

## 2020-04-25 NOTE — Telephone Encounter (Signed)
I spoke with Cindy [name]. She states that she is having shooting pain in her left breast.  She states this is the same pain when she was first diagnosed with breast cancer.

## 2020-04-28 ENCOUNTER — Telehealth: Payer: Self-pay | Admitting: Nurse Practitioner

## 2020-04-28 NOTE — Telephone Encounter (Signed)
I called Ms. Filippone to f/u on her left breast pain. Began after mammogram in 02/2020. Pain moves around, character is similar to pain at initial diagnosis. Pain was more prevalent over the weekend, but had none in last 2 days. She has nipple itching, no inversion or discharge. Denies a palpable lump/mass or other symptoms.   I reviewed certain post surgical pain is normal. She can try warm compress, massage, and tylenol or NSAIDs. I will see her 9/29 for f/up to determine if she needs further studies (normal mammo in 02/2020). She agrees with the plan and appreciates the call.   [name], NP

## 2020-05-09 ENCOUNTER — Inpatient Hospital Stay: Payer: PPO | Attending: Hematology | Admitting: Nurse Practitioner

## 2020-05-09 ENCOUNTER — Encounter: Payer: Self-pay | Admitting: Nurse Practitioner

## 2020-05-09 ENCOUNTER — Other Ambulatory Visit: Payer: Self-pay

## 2020-05-09 VITALS — BP 126/65 | HR 86 | Temp 97.7°F | Resp 18 | Ht 64.0 in | Wt 149.4 lb

## 2020-05-09 DIAGNOSIS — Z853 Personal history of malignant neoplasm of breast: Secondary | ICD-10-CM | POA: Insufficient documentation

## 2020-05-09 DIAGNOSIS — Z79899 Other long term (current) drug therapy: Secondary | ICD-10-CM | POA: Insufficient documentation

## 2020-05-09 DIAGNOSIS — Z171 Estrogen receptor negative status [ER-]: Secondary | ICD-10-CM | POA: Insufficient documentation

## 2020-05-09 DIAGNOSIS — Z923 Personal history of irradiation: Secondary | ICD-10-CM | POA: Diagnosis not present

## 2020-05-09 DIAGNOSIS — C50212 Malignant neoplasm of upper-inner quadrant of left female breast: Secondary | ICD-10-CM | POA: Diagnosis not present

## 2020-05-09 DIAGNOSIS — N644 Mastodynia: Secondary | ICD-10-CM | POA: Diagnosis not present

## 2020-05-09 DIAGNOSIS — F32A Depression, unspecified: Secondary | ICD-10-CM | POA: Insufficient documentation

## 2020-05-09 NOTE — Progress Notes (Signed)
[facility]   [phone] [phone]   Clinic Follow up Note   Patient Care Team: Mosie Lukes, MD as PCP - General (Family Medicine) Cherylann Banas, Cherly Anderson, MD (Inactive) (Obstetrics and Gynecology) Rockwell Germany, RN as Oncology Nurse Navigator Mauro Kaufmann, RN as Oncology Nurse Navigator Erroll Luna, MD as Consulting Physician (General Surgery) Truitt Merle, MD as Consulting Physician (Hematology) Gery Pray, MD as Consulting Physician (Radiation Oncology) 05/09/2020  CHIEF COMPLAINT: Left breast pain  SUMMARY OF ONCOLOGIC HISTORY: Oncology History Overview Note  Cancer Staging Malignant neoplasm of upper-inner quadrant of left breast in female, estrogen receptor negative ([place]) Staging form: Breast, AJCC 8th Edition - Clinical stage from 02/06/2019: Stage IB (cT1c, cN0, cM0, G2, ER-, PR-, HER2-) - Signed by Truitt Merle, MD on 02/17/2019 - Pathologic stage from 03/10/2019: Stage IIA (pT2, pN0, cM0, G3, ER-, PR-, HER2-) - Signed by Truitt Merle, MD on 03/12/2019    Malignant neoplasm of upper-inner quadrant of left breast in female, estrogen receptor negative ([place])  02/04/2019 Mammogram   Diagnostic mammogram 02/04/19  IMPRESSION: Highly suspicious 1.7 x1.5 x1.7cm at 10:00 position of UPPER INNER LEFT breast mass 10 cm from nipple corresponding to the screening study finding. Tissue sampling recommended.   No abnormal LEFT axillary lymph nodes.   02/06/2019 Cancer Staging   Staging form: Breast, AJCC 8th Edition - Clinical stage from 02/06/2019: Stage IB (cT1c, cN0, cM0, G2, ER-, PR-, HER2-) - Signed by Truitt Merle, MD on 02/17/2019   02/06/2019 Initial Biopsy   Diagnosis 02/06/19 Breast, left, needle core biopsy, 10 o'clock, 10cm/n, ribbon clip - INVASIVE MAMMARY CARCINOMA.   02/06/2019 Receptors her2   Results: IMMUNOHISTOCHEMICAL AND MORPHOMETRIC ANALYSIS PERFORMED MANUALLY The tumor cells are NEGATIVE for Her2 (1+). Estrogen Receptor: 0%,  NEGATIVE Progesterone Receptor: 0%, NEGATIVE Proliferation Marker Ki67: 40%   02/17/2019 Initial Diagnosis   Malignant neoplasm of upper-inner quadrant of left breast in female, estrogen receptor negative ([place])   03/10/2019 Cancer Staging   Staging form: Breast, AJCC 8th Edition - Pathologic stage from 03/10/2019: Stage IIA (pT2, pN0, cM0, G3, ER-, PR-, HER2-) - Signed by Truitt Merle, MD on 03/12/2019   03/10/2019 Surgery   LEFT BREAST LUMPECTOMY WITH RADIOACTIVE SEED AND LEFT SENTINEL LYMPH NODE MAPPING and PAC placement  by Dr. Brantley Stage  03/10/19   03/10/2019 Pathology Results   DIAGNOSIS: 03/10/19  A. BREAST, LEFT, LUMPECTOMY:  - Invasive ductal carcinoma, grade 3, 2.5 cm  - Carcinoma is 0.3 cm from superior margin  - Medial resection edge is 0.1 cm from carcinoma (final medial margin is  represented by part 2 which is negative for carcinoma)  - Negative for lymphovascular or perineural invasion  - Biopsy site changes  - See oncology table   B. BREAST, LEFT MEDIAL MARGIN, EXCISION:  - Benign breast parenchyma, negative for carcinoma   C. LYMPH NODE, LEFT AXILLARY, SENTINEL BIOPSY:  - Lymph node, negative for carcinoma (0/1)    04/06/2019 Imaging   Bone scan IMPRESSION: Mild focus of uptake is seen involving the anterior portion of left upper rib, but there is no corresponding abnormality seen on the CT scan of the same day. Potentially this may represent degenerative change or secondary to overlying postsurgical change. Attention on follow-up is recommended.   No definite scintigraphic evidence of osseous metastases.   04/06/2019 Imaging   CT CAP IMPRESSION: 1. Surgical changes left breast with borderline enlarged left axillary lymph node. No other definite evidence for metastatic disease in  the chest, abdomen, or pelvis. 2. Tiny bilateral pulmonary nodules. Attention on follow-up recommended. 3. Scattered small hypoattenuating lesions in the liver parenchyma. Some of these  have attenuation higher than would be expected for simple fluid and may be cysts complicated by proteinaceous debris or hemorrhage. MRI without and with contrast recommended to further evaluate. 4. Small to moderate hiatal hernia. 5. Aortic Atherosclerosis (ICD10-I70.0).   04/08/2019 - 09/14/2019 Chemotherapy   chemo AC q2weeks for 4 cycles starting 04/08/19-05/20/19 followed by Taxol weekly for 12 weeks starting 06/09/19.     09/16/2019 Imaging   CT CAP w contrast IMPRESSION: 1. No evidence for metastatic disease in the chest, abdomen, or pelvis. 2. No change in the tiny bilateral pulmonary nodules, likely benign. Attention on follow-up suggested. 3. Stable appearance of multiple hypoattenuating liver lesions. None of these lesions show suspicious enhancement on today's study in there probably a combination of simple and complex cysts. There are several tiny liver lesions too small to characterize on today's study. Continued attention on follow-up recommended. 4. Small hiatal hernia. 5.  Aortic Atherosclerois (ICD10-170.0)   10/07/2019 - 11/04/2019 Radiation Therapy   Adjuvant RT with Dr Sondra Come 10/07/19-11/04/19   11/25/2019 Procedure   PAC removed by Dr Brantley Stage     CURRENT THERAPY: Surveillance  INTERVAL HISTORY: Ms. Brizuela returns as scheduled for evaluation of left breast pain.  She initially had "excruciating" upper inner quadrant pain when she was diagnosed with breast cancer last year.  That pain radiated towards the nipple/areola.  The pain resolved after lumpectomy.  She developed a new outer left breast pain after mammogram in 02/2020, not as severe.  This pain is mild and intermittent.  It does not limit her function or daily activities.  She has not required pain medication.  She has nipple itching but no discharge or inversion.  She feels anxious, worried this could be cancer recurrence.  She is just started getting her energy back from adjuvant treatment.   MEDICAL HISTORY:   Past Medical History:  Diagnosis Date  . Allergy   . Depression    ? if bipolar works the best  . Elevated fasting blood sugar    hx  . H/O measles   . Headache   . Hiatal hernia   . History of chicken pox   . Hx of migraines   . Microscopic hematuria    advised to see urology ? never went  . Preventative health care 06/20/2011  . Radiation dermatitis    chest    SURGICAL HISTORY: Past Surgical History:  Procedure Laterality Date  . BREAST LUMPECTOMY WITH RADIOACTIVE SEED AND SENTINEL LYMPH NODE BIOPSY Left 03/10/2019   Procedure: LEFT BREAST LUMPECTOMY WITH RADIOACTIVE SEED AND LEFT SENTINEL LYMPH NODE MAPPING;  Surgeon: Erroll Luna, MD;  Location: [facility];  Service: General;  Laterality: Left;  . endometrial polyps     2 surgeries by Dr Cathlean Cower  . EYE SURGERY  04/29/2009   Rt eye tighten up  . PORT-A-CATH REMOVAL Right 11/25/2019   Procedure: PORT REMOVAL;  Surgeon: Erroll Luna, MD;  Location: [facility];  Service: General;  Laterality: Right;  . PORTACATH PLACEMENT N/A 03/10/2019   Procedure: INSERTION PORT-A-CATH WITH ULTRASOUND;  Surgeon: Erroll Luna, MD;  Location: [place];  Service: General;  Laterality: N/A;  . steel plate in skull Left    from a crushed skull    I have reviewed the social history and family history with the patient and they are unchanged from  previous note.  ALLERGIES:  is allergic to bromfed and venlafaxine.  MEDICATIONS:  Current Outpatient Medications  Medication Sig Dispense Refill  . Ascorbic Acid (VITAMIN C PO) Take by mouth.    Marland Kitchen BIOTIN PO Take by mouth.    . cholecalciferol (VITAMIN D3) 25 MCG (1000 UNIT) tablet Take 1,000 Units by mouth daily.    Marland Kitchen FLUoxetine (PROZAC) 40 MG capsule Take 1 capsule (40 mg total) by mouth daily. 90 capsule 2  . lidocaine-prilocaine (EMLA) cream Apply to affected area once 30 g 3  . Multiple Vitamin (MULTIVITAMIN WITH MINERALS) TABS tablet Take 1 tablet by mouth daily.    .  ondansetron (ZOFRAN) 8 MG tablet Take 1 tablet (8 mg total) by mouth 2 (two) times daily as needed. Start on the third day after chemotherapy. (Patient not taking: Reported on 09/30/2019) 30 tablet 2  . prochlorperazine (COMPAZINE) 10 MG tablet Take 1 tablet (10 mg total) by mouth every 6 (six) hours as needed (Nausea or vomiting). (Patient not taking: Reported on 09/30/2019) 30 tablet 2   No current facility-administered medications for this visit.    PHYSICAL EXAMINATION:  Vitals:   05/09/20 1502  BP: 126/65  Pulse: 86  Resp: 18  Temp: 97.7 F (36.5 C)  SpO2: 97%   Filed Weights   05/09/20 1502  Weight: 149 lb 6.4 oz (67.8 kg)    GENERAL:alert, no distress and comfortable SKIN: No rash EYES: sclera clear NECK: Without mass LYMPH:  no palpable cervical or supraclavicular lymphadenopathy LUNGS: normal breathing effort HEART:  no lower extremity edema NEURO: alert & oriented x 3 with fluent speech, no focal motor/sensory deficits PAC removed, incision healed Breast exam: Asymmetric breasts without nipple discharge or inversion.  S/p left lumpectomy.  Upper inner quadrant and axillary incisions completely healed.  No scar tissue.  There is soft tissue densities in the lower inner left breast that are tender to palpation, no discrete mass.  No other palpable mass in either breast or axilla that I could appreciate  LABORATORY DATA:  No labs for today's visit   RADIOGRAPHIC STUDIES: I have personally reviewed the radiological images as listed and agreed with the findings in the report. No results found.   ASSESSMENT & PLAN: 69 year old female  1.  Left breast pain  2.Malignant neoplasm of upper-inner quadrant of left breast, StageIIA,p(T2N0M0), ER/PR/HER2-,triple negative,GradeIII -She was diagnosed in 02/2019. She underwent left breast lumpectomy with SLNB on 03/10/19. -S/p adjuvant AC-T from 04/08/2019-09/14/2019 followed by adjuvant radiation per Dr. Sondra Come which she  completed in 10/2019. Recovered.  -diagnostic mammo 02/19/20 negative. Continue surveillance   Disposition Ms. Crowl is clinically doing well.  She has had various left breast pains including UOQ pain with diagnosis that resolved after definitive treatment and a new outer breast pain that began after recent mammogram.  There is no abnormality or tenderness in the area of concern on today's exam, however there is a tender area of soft tissue density in the lower inner left breast, likely fibroglandular tissue.   I reviewed her diagnostic mammogram on 02/19/2020 was negative.  Due to metal implant she cannot undergo MRI.  I am referring her for left breast ultrasound to evaluate the tender area.  We discussed normal postoperative breast pain. Overall have a low clinical suspicion for recurrence. I  I will call her with the results of the ultrasound.  If work-up is negative, she will return for routine surveillance follow-up with Dr. Burr Medico in 07/2020.   Orders Placed This  Encounter  Procedures  . US BREAST LTD UNI LEFT INC AXILLA    Standing Status:   Future    Standing Expiration Date:   05/09/2021    Order Specific Question:   Reason for Exam (SYMPTOM  OR DIAGNOSIS REQUIRED)    Answer:   h/o left breast cancer s/p lumpectomy, chemo, RT. new outer left breast pain. palpable tenderness in lower inner quad left breast. 02/2020 mammo negative. can't do MRI    Order Specific Question:   Preferred imaging location?    Answer:   [facility]   All questions were answered. The patient knows to call the clinic with any problems, questions or concerns. No barriers to learning were detected.     Alla Feeling, NP 05/09/20

## 2020-06-09 ENCOUNTER — Other Ambulatory Visit: Payer: Self-pay | Admitting: Nurse Practitioner

## 2020-06-09 ENCOUNTER — Ambulatory Visit
Admission: RE | Admit: 2020-06-09 | Discharge: 2020-06-09 | Disposition: A | Discharge status: Home / Self Care | Payer: PPO | Source: Ambulatory Visit | Attending: Nurse Practitioner | Admitting: Nurse Practitioner

## 2020-06-09 ENCOUNTER — Other Ambulatory Visit: Payer: Self-pay

## 2020-06-09 DIAGNOSIS — R921 Mammographic calcification found on diagnostic imaging of breast: Secondary | ICD-10-CM | POA: Diagnosis not present

## 2020-06-09 DIAGNOSIS — N644 Mastodynia: Secondary | ICD-10-CM

## 2020-06-09 HISTORY — DX: Personal history of irradiation: Z92.3

## 2020-06-09 HISTORY — DX: Personal history of antineoplastic chemotherapy: Z92.21

## 2020-06-21 ENCOUNTER — Telehealth: Payer: Self-pay | Admitting: Nurse Practitioner

## 2020-06-21 NOTE — Telephone Encounter (Signed)
I called [name] about recent left breast pain and mammogram which showed post treatment changes and likely benign calcifications. She is not having pain currently. We discussed normal post-op/post-tx changes and red flags. We will monitor for now, she knows to call with new/worsening concerns.   Cindy Rue, NP

## 2020-06-24 ENCOUNTER — Other Ambulatory Visit: Payer: PPO

## 2020-06-24 DIAGNOSIS — Z20822 Contact with and (suspected) exposure to covid-19: Secondary | ICD-10-CM

## 2020-06-26 LAB — NOVEL CORONAVIRUS, NAA: SARS-CoV-2, NAA: NOT DETECTED

## 2020-06-26 LAB — SARS-COV-2, NAA 2 DAY TAT

## 2020-06-27 ENCOUNTER — Encounter: Payer: PPO | Admitting: Family Medicine

## 2020-06-28 ENCOUNTER — Other Ambulatory Visit (HOSPITAL_BASED_OUTPATIENT_CLINIC_OR_DEPARTMENT_OTHER): Payer: Self-pay | Admitting: Internal Medicine

## 2020-06-28 ENCOUNTER — Ambulatory Visit: Payer: PPO | Attending: Internal Medicine

## 2020-06-28 DIAGNOSIS — Z23 Encounter for immunization: Secondary | ICD-10-CM

## 2020-06-28 MED FILL — PFIZER-BIONTECH COVID-19 VA: 30 | 21 days supply | Qty: 0 | Fill #0

## 2020-06-28 NOTE — Progress Notes (Signed)
   Covid-19 Vaccination Clinic  Name:  Cindy [name]    MRN: [id number] DOB: 09-Apr-1951  06/28/2020  Cindy [name] was observed post Covid-19 immunization for 15 minutes without incident. She was provided with Vaccine Information Sheet and instruction to access the V-Safe system.  Vaccinated by [facility]  Cindy [name] was instructed to call 911 with any severe reactions post vaccine: Marland Kitchen Difficulty breathing  . Swelling of face and throat  . A fast heartbeat  . A bad rash all over body  . Dizziness and weakness   Immunizations Administered    Name Date Dose VIS Date Route   Pfizer COVID-19 Vaccine 06/28/2020 10:59 AM 0.3 mL 03/30/2020 Intramuscular   Manufacturer: [place]   Lot: [id number]   NDC: [address]-7

## 2020-07-26 ENCOUNTER — Encounter: Payer: PPO | Admitting: Family Medicine

## 2020-07-29 NOTE — Progress Notes (Signed)
[place]   [phone] [phone]   Clinic Follow up Note   Patient Care Team: Mosie Lukes, MD as PCP - General (Family Medicine) Cherylann Banas, Cherly Anderson, MD (Inactive) (Obstetrics and Gynecology) Rockwell Germany, RN as Oncology Nurse Navigator Mauro Kaufmann, RN as Oncology Nurse Navigator Erroll Luna, MD as Consulting Physician (General Surgery) Truitt Merle, MD as Consulting Physician (Hematology) Gery Pray, MD as Consulting Physician (Radiation Oncology)  Date of Service:  08/01/2020  CHIEF COMPLAINT: F/u of left breast cancer  SUMMARY OF ONCOLOGIC HISTORY: Oncology History Overview Note  Cancer Staging Malignant neoplasm of upper-inner quadrant of left breast in female, estrogen receptor negative ([place]) Staging form: Breast, AJCC 8th Edition - Clinical stage from 02/06/2019: Stage IB (cT1c, cN0, cM0, G2, ER-, PR-, HER2-) - Signed by Truitt Merle, MD on 02/17/2019 - Pathologic stage from 03/10/2019: Stage IIA (pT2, pN0, cM0, G3, ER-, PR-, HER2-) - Signed by Truitt Merle, MD on 03/12/2019    Malignant neoplasm of upper-inner quadrant of left breast in female, estrogen receptor negative ([place])  02/04/2019 Mammogram   Diagnostic mammogram 02/04/19  IMPRESSION: Highly suspicious 1.7 x1.5 x1.7cm at 10:00 position of UPPER INNER LEFT breast mass 10 cm from nipple corresponding to the screening study finding. Tissue sampling recommended.   No abnormal LEFT axillary lymph nodes.   02/06/2019 Cancer Staging   Staging form: Breast, AJCC 8th Edition - Clinical stage from 02/06/2019: Stage IB (cT1c, cN0, cM0, G2, ER-, PR-, HER2-) - Signed by Truitt Merle, MD on 02/17/2019   02/06/2019 Initial Biopsy   Diagnosis 02/06/19 Breast, left, needle core biopsy, 10 o'clock, 10cm/n, ribbon clip - INVASIVE MAMMARY CARCINOMA.   02/06/2019 Receptors her2   Results: IMMUNOHISTOCHEMICAL AND MORPHOMETRIC ANALYSIS PERFORMED MANUALLY The tumor cells are NEGATIVE for Her2  (1+). Estrogen Receptor: 0%, NEGATIVE Progesterone Receptor: 0%, NEGATIVE Proliferation Marker Ki67: 40%   02/17/2019 Initial Diagnosis   Malignant neoplasm of upper-inner quadrant of left breast in female, estrogen receptor negative ([place])   03/10/2019 Cancer Staging   Staging form: Breast, AJCC 8th Edition - Pathologic stage from 03/10/2019: Stage IIA (pT2, pN0, cM0, G3, ER-, PR-, HER2-) - Signed by Truitt Merle, MD on 03/12/2019   03/10/2019 Surgery   LEFT BREAST LUMPECTOMY WITH RADIOACTIVE SEED AND LEFT SENTINEL LYMPH NODE MAPPING and PAC placement  by Dr. Brantley Stage  03/10/19   03/10/2019 Pathology Results   DIAGNOSIS: 03/10/19  A. BREAST, LEFT, LUMPECTOMY:  - Invasive ductal carcinoma, grade 3, 2.5 cm  - Carcinoma is 0.3 cm from superior margin  - Medial resection edge is 0.1 cm from carcinoma (final medial margin is  represented by part 2 which is negative for carcinoma)  - Negative for lymphovascular or perineural invasion  - Biopsy site changes  - See oncology table   B. BREAST, LEFT MEDIAL MARGIN, EXCISION:  - Benign breast parenchyma, negative for carcinoma   C. LYMPH NODE, LEFT AXILLARY, SENTINEL BIOPSY:  - Lymph node, negative for carcinoma (0/1)    04/06/2019 Imaging   Bone scan IMPRESSION: Mild focus of uptake is seen involving the anterior portion of left upper rib, but there is no corresponding abnormality seen on the CT scan of the same day. Potentially this may represent degenerative change or secondary to overlying postsurgical change. Attention on follow-up is recommended.   No definite scintigraphic evidence of osseous metastases.   04/06/2019 Imaging   CT CAP IMPRESSION: 1. Surgical changes left breast with borderline enlarged left axillary lymph node. No  other definite evidence for metastatic disease in the chest, abdomen, or pelvis. 2. Tiny bilateral pulmonary nodules. Attention on follow-up recommended. 3. Scattered small hypoattenuating lesions in the  liver parenchyma. Some of these have attenuation higher than would be expected for simple fluid and may be cysts complicated by proteinaceous debris or hemorrhage. MRI without and with contrast recommended to further evaluate. 4. Small to moderate hiatal hernia. 5. Aortic Atherosclerosis (ICD10-I70.0).   04/08/2019 - 09/14/2019 Chemotherapy   chemo AC q2weeks for 4 cycles starting 04/08/19-05/20/19 followed by Taxol weekly for 12 weeks starting 06/09/19.     09/16/2019 Imaging   CT CAP w contrast IMPRESSION: 1. No evidence for metastatic disease in the chest, abdomen, or pelvis. 2. No change in the tiny bilateral pulmonary nodules, likely benign. Attention on follow-up suggested. 3. Stable appearance of multiple hypoattenuating liver lesions. None of these lesions show suspicious enhancement on today's study in there probably a combination of simple and complex cysts. There are several tiny liver lesions too small to characterize on today's study. Continued attention on follow-up recommended. 4. Small hiatal hernia. 5.  Aortic Atherosclerois (ICD10-170.0)   10/07/2019 - 11/04/2019 Radiation Therapy   Adjuvant RT with Dr Kinard 10/07/19-11/04/19   11/25/2019 Procedure   PAC removed by Dr Cornett      CURRENT THERAPY:  Surveillance  INTERVAL HISTORY:  Cindy [name] is here for a follow up of left breast cancer. She presents to the clinic alone. She notes she is doing well. She notes no new changes. She notes there was no labs done, as she was not recently seen by her PCP. She denies abdominal issues, chest pain or breathing issues.     REVIEW OF SYSTEMS:   Constitutional: Denies fevers, chills or abnormal weight loss Eyes: Denies blurriness of vision Ears, nose, mouth, throat, and face: Denies mucositis or sore throat Respiratory: Denies cough, dyspnea or wheezes Cardiovascular: Denies palpitation, chest discomfort or lower extremity swelling Gastrointestinal:  Denies nausea,  heartburn or change in bowel habits Skin: Denies abnormal skin rashes Lymphatics: Denies new lymphadenopathy or easy bruising Neurological:Denies numbness, tingling or new weaknesses Behavioral/Psych: Mood is stable, no new changes  All other systems were reviewed with the patient and are negative.  MEDICAL HISTORY:  Past Medical History:  Diagnosis Date  . Allergy   . Depression    ? if bipolar works the best  . Elevated fasting blood sugar    hx  . H/O measles   . Headache   . Hiatal hernia   . History of chicken pox   . Hx of migraines   . Microscopic hematuria    advised to see urology ? never went  . Personal history of chemotherapy   . Personal history of radiation therapy   . Preventative health care 06/20/2011  . Radiation dermatitis    chest    SURGICAL HISTORY: Past Surgical History:  Procedure Laterality Date  . BREAST BIOPSY Left 01/2019  . BREAST LUMPECTOMY Left 02/2019  . BREAST LUMPECTOMY WITH RADIOACTIVE SEED AND SENTINEL LYMPH NODE BIOPSY Left 03/10/2019   Procedure: LEFT BREAST LUMPECTOMY WITH RADIOACTIVE SEED AND LEFT SENTINEL LYMPH NODE MAPPING;  Surgeon: Cornett, Thomas, MD;  Location: [facility] OR;  Service: General;  Laterality: Left;  . endometrial polyps     2 surgeries by Dr Gottseigen  . EYE SURGERY  04/29/2009   Rt eye tighten up  . PORT-A-CATH REMOVAL Right 11/25/2019   Procedure: PORT REMOVAL;  Surgeon: Cornett, Thomas, MD;  Location:   [place];  Service: General;  Laterality: Right;  . PORTACATH PLACEMENT N/A 03/10/2019   Procedure: INSERTION PORT-A-CATH WITH ULTRASOUND;  Surgeon: Erroll Luna, MD;  Location: [facility];  Service: General;  Laterality: N/A;  . steel plate in skull Left    from a crushed skull    I have reviewed the social history and family history with the patient and they are unchanged from previous note.  ALLERGIES:  is allergic to bromfed and venlafaxine.  MEDICATIONS:  Current Outpatient Medications   Medication Sig Dispense Refill  . Ascorbic Acid (VITAMIN C PO) Take by mouth.    Marland Kitchen BIOTIN PO Take by mouth.    . cholecalciferol (VITAMIN D3) 25 MCG (1000 UNIT) tablet Take 1,000 Units by mouth daily.    Marland Kitchen FLUoxetine (PROZAC) 40 MG capsule Take 1 capsule (40 mg total) by mouth daily. 90 capsule 2  . lidocaine-prilocaine (EMLA) cream Apply to affected area once 30 g 3  . Multiple Vitamin (MULTIVITAMIN WITH MINERALS) TABS tablet Take 1 tablet by mouth daily.    . ondansetron (ZOFRAN) 8 MG tablet Take 1 tablet (8 mg total) by mouth 2 (two) times daily as needed. Start on the third day after chemotherapy. (Patient not taking: Reported on 09/30/2019) 30 tablet 2  . prochlorperazine (COMPAZINE) 10 MG tablet Take 1 tablet (10 mg total) by mouth every 6 (six) hours as needed (Nausea or vomiting). (Patient not taking: Reported on 09/30/2019) 30 tablet 2   No current facility-administered medications for this visit.    PHYSICAL EXAMINATION: ECOG PERFORMANCE STATUS: 0 - Asymptomatic  Vitals:   08/01/20 1329  BP: 120/74  Pulse: 96  Resp: 17  Temp: (!) 97.5 F (36.4 C)  SpO2: 99%   Filed Weights   08/01/20 1329  Weight: 153 lb (69.4 kg)    GENERAL:alert, no distress and comfortable SKIN: skin color, texture, turgor are normal, no rashes or significant lesions EYES: normal, Conjunctiva are pink and non-injected, sclera clear  NECK: supple, thyroid normal size, non-tender, without nodularity LYMPH:  no palpable lymphadenopathy in the cervical, axillary  LUNGS: clear to auscultation and percussion with normal breathing effort HEART: regular rate & rhythm and no murmurs and no lower extremity edema ABDOMEN:abdomen soft, non-tender and normal bowel sounds Musculoskeletal:no cyanosis of digits and no clubbing  NEURO: alert & oriented x 3 with fluent speech, no focal motor/sensory deficits BREAST: S/p left lumpectomy: Surgical incision healed well with mild scar tissue. No palpable mass, nodules  or adenopathy bilaterally. Breast exam benign.   LABORATORY DATA:  I have reviewed the data as listed CBC Latest Ref Rng & Units 08/01/2020 03/17/2020 11/16/2019  WBC 4.0 - 10.5 K/uL 5.7 5.5 5.6  Hemoglobin 12.0 - 15.0 g/dL 13.3 13.3 11.9(L)  Hematocrit 36.0 - 46.0 % 39.3 39.6 36.4  Platelets 150 - 400 K/uL 198 211 237     CMP Latest Ref Rng & Units 08/01/2020 03/17/2020 11/16/2019  Glucose 70 - 99 mg/dL 115(H) 112(H) 106(H)  BUN 8 - 23 mg/dL 26(H) 22 22  Creatinine 0.44 - 1.00 mg/dL 0.81 0.82 0.81  Sodium 135 - 145 mmol/L 141 138 140  Potassium 3.5 - 5.1 mmol/L 4.8 4.4 4.4  Chloride 98 - 111 mmol/L 105 103 104  CO2 22 - 32 mmol/L _0 Calcium 8.9 - 10.3 mg/dL 9.6 9.8 9.5  Total Protein 6.5 - 8.1 g/dL 7.3 7.1 6.7  Total Bilirubin 0.3 - 1.2 mg/dL 0.2(L) 0.5 0.3  Alkaline Phos 38 -  126 U/L 65 71 70  AST 15 - 41 U/L _0 ALT 0 - 44 U/L _1 RADIOGRAPHIC STUDIES: I have personally reviewed the radiological images as listed and agreed with the findings in the report. No results found.   ASSESSMENT & PLAN:  Cindy [name] is a 70 y.o. female with   1.Malignant neoplasm of upper-inner quadrant of left breast, StageIIA,p(T2N0M0), ER/PR/HER2-,triple negative,GradeIII -She was diagnosed in 02/2019. She underwent left breast lumpectomy with [facility]NB on 03/10/19. -Given her aggressive triple negative breast cancer,she was treated with adjuvantchemoAC-T which she completed in early 09/2019. She completed Adjuvant RT with Dr Sondra Come 10/07/19-11/04/19. She is currently on Surveillance.  -She is clinically doing well. Lab will be done today. Her physical exam and was unremarkable. There is no clinical concern for recurrence. -Continue surveillance. She has a 51-monthf/u mammogram scheduled for 12/2020 due to the breast calcification. Will proceed with routine mammogram in 02/2021.  -F/u in 6 months with NP Lacie    2. Left breast pain  -She has new outer breast pain that  began after 02/2020 mammogram.  There is no abnormality or tenderness in the area of concern on exam, however there is a tender area of soft tissue density in the lower inner left breast, likely fibroglandular tissue seen on NP Lacie's 05/09/20 exam  -Repeat Mammogram on 06/09/20 was negative except calcification.  -breast pain has resolved     3. Genetic Testing: has not proceeded   5. Acid reflux, Hiatal hernia  -Does cause her to cough occasionally.  -ContinuePrilosec OTC.  -Stable. I recommend she go for updated colonoscopy for screening and upper endoscopy. She is agreeable and will contact GI office for scheduling.    PLAN: -Continue breast cancer surveillance  -left Mammogram on 12/09/20  -Lab and F/u in 6 months with NP Lacie  -due for b/l mammogram in 02/2021   No problem-specific Assessment & Plan notes found for this encounter.   Orders Placed This Encounter  Procedures  . CBC with Differential (Cancer Center Only)    Standing Status:   Standing    Number of Occurrences:   5    Standing Expiration Date:   08/01/2021  . [place] ([place])    Standing Status:   Standing    Number of Occurrences:   5    Standing Expiration Date:   08/01/2021   All questions were answered. The patient knows to call the clinic with any problems, questions or concerns. No barriers to learning was detected. The total time spent in the appointment was 25 minutes.     YTruitt Merle MD 08/01/2020   I, AJoslyn Devon am acting as scribe for YTruitt Merle MD.   I have reviewed the above documentation for accuracy and completeness, and I agree with the above.

## 2020-08-01 ENCOUNTER — Encounter: Payer: Self-pay | Admitting: Hematology

## 2020-08-01 ENCOUNTER — Inpatient Hospital Stay: Payer: PPO

## 2020-08-01 ENCOUNTER — Inpatient Hospital Stay: Payer: PPO | Attending: Hematology | Admitting: Hematology

## 2020-08-01 ENCOUNTER — Other Ambulatory Visit: Payer: Self-pay

## 2020-08-01 VITALS — BP 120/74 | HR 96 | Temp 97.5°F | Resp 17 | Ht 64.0 in | Wt 153.0 lb

## 2020-08-01 DIAGNOSIS — Z171 Estrogen receptor negative status [ER-]: Secondary | ICD-10-CM | POA: Diagnosis not present

## 2020-08-01 DIAGNOSIS — Z923 Personal history of irradiation: Secondary | ICD-10-CM | POA: Insufficient documentation

## 2020-08-01 DIAGNOSIS — K449 Diaphragmatic hernia without obstruction or gangrene: Secondary | ICD-10-CM | POA: Insufficient documentation

## 2020-08-01 DIAGNOSIS — Z853 Personal history of malignant neoplasm of breast: Secondary | ICD-10-CM | POA: Insufficient documentation

## 2020-08-01 DIAGNOSIS — K219 Gastro-esophageal reflux disease without esophagitis: Secondary | ICD-10-CM | POA: Diagnosis not present

## 2020-08-01 DIAGNOSIS — C50212 Malignant neoplasm of upper-inner quadrant of left female breast: Secondary | ICD-10-CM

## 2020-08-01 LAB — CMP (CANCER CENTER ONLY)
ALT: 20 U/L (ref 0–44)
AST: 19 U/L (ref 15–41)
Albumin: 4.1 g/dL (ref 3.5–5.0)
Alkaline Phosphatase: 65 U/L (ref 38–126)
Anion gap: 8 (ref 5–15)
BUN: 26 mg/dL — ABNORMAL HIGH (ref 8–23)
CO2: 28 mmol/L (ref 22–32)
Calcium: 9.6 mg/dL (ref 8.9–10.3)
Chloride: 105 mmol/L (ref 98–111)
Creatinine: 0.81 mg/dL (ref 0.44–1.00)
GFR, Estimated: >60 mL/min (ref >60)
Glucose, Bld: 115 mg/dL — ABNORMAL HIGH (ref 70–99)
Potassium: 4.8 mmol/L (ref 3.5–5.1)
Sodium: 141 mmol/L (ref 135–145)
Total Bilirubin: 0.2 mg/dL — ABNORMAL LOW (ref 0.3–1.2)
Total Protein: 7.3 g/dL (ref 6.5–8.1)

## 2020-08-01 LAB — CBC WITH DIFFERENTIAL (CANCER CENTER ONLY)
Abs Immature Granulocytes: 0.01 10*3/uL (ref 0.00–0.07)
Basophils Absolute: 0 10*3/uL (ref 0.0–0.1)
Basophils Relative: 1 %
Eosinophils Absolute: 0.1 10*3/uL (ref 0.0–0.5)
Eosinophils Relative: 2 %
HCT: 39.3 % (ref 36.0–46.0)
Hemoglobin: 13.3 g/dL (ref 12.0–15.0)
Immature Granulocytes: 0 %
Lymphocytes Relative: 34 %
Lymphs Abs: 1.9 10*3/uL (ref 0.7–4.0)
MCH: 32.3 pg (ref 26.0–34.0)
MCHC: 33.8 g/dL (ref 30.0–36.0)
MCV: 95.4 fL (ref 80.0–100.0)
Monocytes Absolute: 0.4 10*3/uL (ref 0.1–1.0)
Monocytes Relative: 7 %
Neutro Abs: 3.2 10*3/uL (ref 1.7–7.7)
Neutrophils Relative %: 56 %
Platelet Count: 198 10*3/uL (ref 150–400)
RBC: 4.12 MIL/uL (ref 3.87–5.11)
RDW: 12.5 % (ref 11.5–15.5)
WBC Count: 5.7 10*3/uL (ref 4.0–10.5)
nRBC: 0 % (ref 0.0–0.2)

## 2020-08-02 ENCOUNTER — Telehealth: Payer: Self-pay | Admitting: Hematology

## 2020-08-02 NOTE — Telephone Encounter (Signed)
Scheduled appointments per 2/21 los. Spoke to patient who is aware of appointments date and times.  

## 2020-08-10 ENCOUNTER — Encounter: Payer: Self-pay | Admitting: Hematology

## 2020-08-30 ENCOUNTER — Other Ambulatory Visit: Payer: Self-pay

## 2020-08-30 ENCOUNTER — Telehealth: Payer: Self-pay

## 2020-08-30 ENCOUNTER — Telehealth: Payer: Self-pay | Admitting: Family Medicine

## 2020-08-30 DIAGNOSIS — R739 Hyperglycemia, unspecified: Secondary | ICD-10-CM

## 2020-08-30 DIAGNOSIS — E785 Hyperlipidemia, unspecified: Secondary | ICD-10-CM

## 2020-08-30 DIAGNOSIS — Z0000 Encounter for general adult medical examination without abnormal findings: Secondary | ICD-10-CM

## 2020-08-30 NOTE — Telephone Encounter (Signed)
Order were placed but LVM to call back to schedule

## 2020-08-30 NOTE — Telephone Encounter (Signed)
Pt needed a lab visit scheduled

## 2020-08-30 NOTE — Telephone Encounter (Signed)
Patient is requesting lab orders placed prior to CPE appointment next week. Patient would like to discuss  lab results at visit with Dr Charlett Blake.   Please advise

## 2020-08-31 NOTE — Telephone Encounter (Signed)
Lab appointment is scheduled for 09/02/20

## 2020-09-02 ENCOUNTER — Other Ambulatory Visit (INDEPENDENT_AMBULATORY_CARE_PROVIDER_SITE_OTHER): Payer: PPO

## 2020-09-02 ENCOUNTER — Other Ambulatory Visit: Payer: Self-pay

## 2020-09-02 DIAGNOSIS — E785 Hyperlipidemia, unspecified: Secondary | ICD-10-CM | POA: Diagnosis not present

## 2020-09-02 DIAGNOSIS — R739 Hyperglycemia, unspecified: Secondary | ICD-10-CM

## 2020-09-02 DIAGNOSIS — Z0000 Encounter for general adult medical examination without abnormal findings: Secondary | ICD-10-CM | POA: Diagnosis not present

## 2020-09-02 LAB — COMPREHENSIVE METABOLIC PANEL
ALT: 16 U/L (ref 0–35)
AST: 16 U/L (ref 0–37)
Albumin: 4.4 g/dL (ref 3.5–5.2)
Alkaline Phosphatase: 61 U/L (ref 39–117)
BUN: 16 mg/dL (ref 6–23)
CO2: 30 mEq/L (ref 19–32)
Calcium: 9.4 mg/dL (ref 8.4–10.5)
Chloride: 103 mEq/L (ref 96–112)
Creatinine, Ser: 0.75 mg/dL (ref 0.40–1.20)
GFR: 81.84 mL/min (ref >60.00)
Glucose, Bld: 120 mg/dL — ABNORMAL HIGH (ref 70–99)
Potassium: 4.9 mEq/L (ref 3.5–5.1)
Sodium: 140 mEq/L (ref 135–145)
Total Bilirubin: 0.5 mg/dL (ref 0.2–1.2)
Total Protein: 6.7 g/dL (ref 6.0–8.3)

## 2020-09-02 LAB — CBC WITH DIFFERENTIAL/PLATELET
Basophils Absolute: 0 10*3/uL (ref 0.0–0.1)
Basophils Relative: 0.3 % (ref 0.0–3.0)
Eosinophils Absolute: 0.1 10*3/uL (ref 0.0–0.7)
Eosinophils Relative: 1.7 % (ref 0.0–5.0)
HCT: 39.3 % (ref 36.0–46.0)
Hemoglobin: 13.7 g/dL (ref 12.0–15.0)
Lymphocytes Relative: 31.9 % (ref 12.0–46.0)
Lymphs Abs: 1.5 10*3/uL (ref 0.7–4.0)
MCHC: 34.8 g/dL (ref 30.0–36.0)
MCV: 93.9 fl (ref 78.0–100.0)
Monocytes Absolute: 0.3 10*3/uL (ref 0.1–1.0)
Monocytes Relative: 6.7 % (ref 3.0–12.0)
Neutro Abs: 2.8 10*3/uL (ref 1.4–7.7)
Neutrophils Relative %: 59.4 % (ref 43.0–77.0)
Platelets: 233 10*3/uL (ref 150.0–400.0)
RBC: 4.19 Mil/uL (ref 3.87–5.11)
RDW: 13.5 % (ref 11.5–15.5)
WBC: 4.7 10*3/uL (ref 4.0–10.5)

## 2020-09-02 LAB — LIPID PANEL
Cholesterol: 210 mg/dL — ABNORMAL HIGH (ref 0–200)
HDL: 72.9 mg/dL (ref >39.00)
LDL Cholesterol: 123 mg/dL — ABNORMAL HIGH (ref 0–99)
NonHDL: 136.8
Total CHOL/HDL Ratio: 3
Triglycerides: 70 mg/dL (ref 0.0–149.0)
VLDL: 14 mg/dL (ref 0.0–40.0)

## 2020-09-02 LAB — TSH: TSH: 1.19 u[IU]/mL (ref 0.35–4.50)

## 2020-09-02 LAB — HEMOGLOBIN A1C: Hgb A1c MFr Bld: 5.8 % (ref 4.6–6.5)

## 2020-09-06 ENCOUNTER — Ambulatory Visit (INDEPENDENT_AMBULATORY_CARE_PROVIDER_SITE_OTHER): Payer: PPO | Admitting: Family Medicine

## 2020-09-06 ENCOUNTER — Other Ambulatory Visit: Payer: Self-pay

## 2020-09-06 ENCOUNTER — Encounter: Payer: Self-pay | Admitting: Family Medicine

## 2020-09-06 VITALS — BP 104/60 | HR 103 | Temp 98.2°F | Resp 16 | Ht 64.0 in | Wt 154.2 lb

## 2020-09-06 DIAGNOSIS — Z171 Estrogen receptor negative status [ER-]: Secondary | ICD-10-CM | POA: Diagnosis not present

## 2020-09-06 DIAGNOSIS — Z1211 Encounter for screening for malignant neoplasm of colon: Secondary | ICD-10-CM | POA: Diagnosis not present

## 2020-09-06 DIAGNOSIS — Z0000 Encounter for general adult medical examination without abnormal findings: Secondary | ICD-10-CM

## 2020-09-06 DIAGNOSIS — E2839 Other primary ovarian failure: Secondary | ICD-10-CM | POA: Diagnosis not present

## 2020-09-06 DIAGNOSIS — R7309 Other abnormal glucose: Secondary | ICD-10-CM

## 2020-09-06 DIAGNOSIS — Z780 Asymptomatic menopausal state: Secondary | ICD-10-CM

## 2020-09-06 DIAGNOSIS — K449 Diaphragmatic hernia without obstruction or gangrene: Secondary | ICD-10-CM | POA: Diagnosis not present

## 2020-09-06 DIAGNOSIS — R3129 Other microscopic hematuria: Secondary | ICD-10-CM

## 2020-09-06 DIAGNOSIS — Z23 Encounter for immunization: Secondary | ICD-10-CM | POA: Diagnosis not present

## 2020-09-06 DIAGNOSIS — C50212 Malignant neoplasm of upper-inner quadrant of left female breast: Secondary | ICD-10-CM

## 2020-09-06 DIAGNOSIS — K219 Gastro-esophageal reflux disease without esophagitis: Secondary | ICD-10-CM | POA: Diagnosis not present

## 2020-09-06 DIAGNOSIS — E785 Hyperlipidemia, unspecified: Secondary | ICD-10-CM

## 2020-09-06 NOTE — Progress Notes (Signed)
Patient ID: Cindy [name], female    DOB: 12/30/1950  Age: 70 y.o. MRN: [id number]    Subjective:  Subjective  HPI [name] presents for presents for comprehensive physical exam today and follow up on management of chronic concerns. She reports feeling very well and denies any recent sicknesses. She denies any chest pain, SOB, fever, abdominal pain, cough, chills, sore throat, dysuria, urinary incontinence, back pain, HA, or N/VD. She states when she was on chemotherapy she found herself eating less and eating more of the unhealthy food like ice cream and junk food. She also states that she was not able to move or exercise due to chemo. She states that even after chemo she maintained those habits, but she plans on changing them. She states that in her younger years she used to eat a lot earlier healthier than what she is currently eating. She endorses following a vegan diet. She endorses that she walks frequently for exercise. She denies any FMHx of osteoporosis or any changes in her FMHx. She reports that her mother had fell before and experienced a fracture, but it healed appropriately. She states that she attempted to get a colonoscopy after the one she had in 2008, but due to chemo treatment, she failed the prep stage of the surgery.  She reports that she was dx with having hiatal hernia. She endorses drinking wine that worsens the symptoms. She denies any issues with her bowel movements. She reports going to a dermatologist and nothing abnormal was found.    Review of Systems  Constitutional: Negative for chills, fatigue and fever.  HENT: Negative for congestion, rhinorrhea, sinus pressure, sinus pain and sore throat.   Eyes: Negative for pain.  Respiratory: Negative for cough and shortness of breath.   Cardiovascular: Negative for chest pain, palpitations and leg swelling.  Gastrointestinal: Negative for abdominal pain, blood in stool, diarrhea, nausea and vomiting.  Genitourinary:  Negative for decreased urine volume, flank pain, frequency, vaginal bleeding and vaginal discharge.  Musculoskeletal: Negative for back pain.  Neurological: Negative for headaches.    History Past Medical History:  Diagnosis Date  . Allergy   . Depression    ? if bipolar works the best  . Elevated fasting blood sugar    hx  . H/O measles   . Headache   . Hiatal hernia   . History of chicken pox   . Hx of migraines   . Microscopic hematuria    advised to see urology ? never went  . Personal history of chemotherapy   . Personal history of radiation therapy   . Preventative health care 06/20/2011  . Radiation dermatitis    chest    She has a past surgical history that includes endometrial polyps; Eye surgery (04/29/2009); steel plate in skull (Left); Breast lumpectomy with radioactive seed and sentinel lymph node biopsy (Left, 03/10/2019); Portacath placement (N/A, 03/10/2019); Port-a-cath removal (Right, 11/25/2019); Breast biopsy (Left, 01/2019); and Breast lumpectomy (Left, 02/2019).   Her family history includes Alzheimer's disease in her mother; COPD in her father; Cancer in her father, maternal grandfather, and another family member; Cancer (age of onset: 43) in her mother; Thyroid disease in her father.She reports that she has quit smoking. She has a 2.50 pack-year smoking history. She has never used smokeless tobacco. She reports previous alcohol use of about 1.0 - 2.0 standard drink of alcohol per week. She reports that she does not use drugs.  Current Outpatient Medications on File Prior to Visit  Medication Sig Dispense Refill  . BIOTIN PO Take by mouth.    . cholecalciferol (VITAMIN D3) 25 MCG (1000 UNIT) tablet Take 1,000 Units by mouth daily.    Marland Kitchen FLUoxetine (PROZAC) 40 MG capsule Take 1 capsule (40 mg total) by mouth daily. 90 capsule 2  . Multiple Vitamin (MULTIVITAMIN WITH MINERALS) TABS tablet Take 1 tablet by mouth daily.    . Ascorbic Acid (VITAMIN C PO) Take by  mouth.     No current facility-administered medications on file prior to visit.     Objective:  Objective  Physical Exam Constitutional:      General: She is not in acute distress.    Appearance: Normal appearance. She is not ill-appearing or toxic-appearing.  HENT:     Head: Normocephalic and atraumatic.     Right Ear: Tympanic membrane, ear canal and external ear normal.     Left Ear: Tympanic membrane, ear canal and external ear normal.     Nose: No congestion or rhinorrhea.  Eyes:     Extraocular Movements: Extraocular movements intact.     Pupils: Pupils are equal, round, and reactive to light.  Cardiovascular:     Rate and Rhythm: Normal rate and regular rhythm.     Pulses: Normal pulses.     Heart sounds: Normal heart sounds. No murmur heard.   Pulmonary:     Effort: Pulmonary effort is normal. No respiratory distress.     Breath sounds: Normal breath sounds. No wheezing, rhonchi or rales.  Abdominal:     General: Bowel sounds are normal.     Palpations: Abdomen is soft. There is no mass.     Tenderness: There is no abdominal tenderness. There is no guarding.     Hernia: No hernia is present.  Musculoskeletal:        General: Normal range of motion.     Cervical back: Normal range of motion and neck supple.  Skin:    General: Skin is warm and dry.  Neurological:     Mental Status: She is alert and oriented to person, place, and time.     Motor: Motor function is intact.     Deep Tendon Reflexes:     Reflex Scores:      Patellar reflexes are 2+ on the right side and 2+ on the left side.    Comments: Vigorous 2+ patellar response   Psychiatric:        Behavior: Behavior normal.    BP 104/60   Pulse (!) 103   Temp 98.2 F (36.8 C)   Resp 16   Ht 5\' 4"  (1.626 m)   Wt 154 lb 3.2 oz (69.9 kg)   SpO2 96%   BMI 26.47 kg/m  Wt Readings from Last 3 Encounters:  09/06/20 154 lb 3.2 oz (69.9 kg)  08/01/20 153 lb (69.4 kg)  05/09/20 149 lb 6.4 oz (67.8 kg)      Lab Results  Component Value Date   WBC 4.7 09/02/2020   HGB 13.7 09/02/2020   HCT 39.3 09/02/2020   PLT 233.0 09/02/2020   GLUCOSE 120 (H) 09/02/2020   CHOL 210 (H) 09/02/2020   TRIG 70.0 09/02/2020   HDL 72.90 09/02/2020   LDLDIRECT 123.1 06/25/2011   LDLCALC 123 (H) 09/02/2020   ALT 16 09/02/2020   AST 16 09/02/2020   NA 140 09/02/2020   K 4.9 09/02/2020   CL 103 09/02/2020   CREATININE 0.75 09/02/2020   BUN 16 09/02/2020   CO2  30 09/02/2020   TSH 1.19 09/02/2020   HGBA1C 5.8 09/02/2020    MM DIAG BREAST TOMO UNI LEFT  Result Date: 06/09/2020 CLINICAL DATA:  70 year old female status post malignant left lumpectomy with adjuvant radiation chemotherapy in September 2020. The patient has diffuse pain intermittently throughout the left breast. EXAM: DIGITAL DIAGNOSTIC UNILATERAL LEFT MAMMOGRAM WITH TOMO AND CAD COMPARISON:  Previous exam(s). ACR Breast Density Category c: The breast tissue is heterogeneously dense, which may obscure small masses. FINDINGS: Post lumpectomy changes are again noted in the far medial left breast and left axilla. There are diffusely scattered rod-like calcifications throughout the superior central left breast. No additional suspicious mammographic findings are identified. Mammographic images were processed with CAD. IMPRESSION: 1. Probably benign left breast calcifications, likely secretory in nature. Recommendation is for short-term imaging follow-up. 2. Stable left breast posttreatment changes without mammographic evidence of malignancy. RECOMMENDATION: 1. Diagnostic left breast mammogram in 6 months. 2. Clinical follow-up recommended for the diffusely painful area of concern in the left breast. Any further workup should be based on clinical grounds. I have discussed the findings and recommendations with the patient. If applicable, a reminder letter will be sent to the patient regarding the next appointment. BI-RADS CATEGORY  3: Probably benign.  Electronically Signed   By: Kristopher Oppenheim M.D.   On: 06/09/2020 13:34     Assessment & Plan:  Plan    No orders of the defined types were placed in this encounter.   Problem List Items Addressed This Visit    MICROSCOPIC HEMATURIA   HYPERGLYCEMIA    hgba1c acceptable, minimize simple carbs. Increase exercise as tolerated.       Relevant Orders   Hemoglobin A1c   Comprehensive metabolic panel   Preventative health care    Patient encouraged to maintain heart healthy diet, regular exercise, adequate sleep. Consider daily probiotics. Take medications as prescribed. Labs ordered and reviewed. dexa scan ordered, given pneumonia shot, referred for colonoscopy.      Malignant neoplasm of upper-inner quadrant of left breast in female, estrogen receptor negative ([place])    She tolerated her chemo and radiation and is feeling much better now. Is walking more and eating better. Will continue to follow up with oncology      Hyperlipidemia    Encouraged heart healthy diet, increase exercise, avoid trans fats, consider a krill oil cap daily      Relevant Orders   Lipid panel    Other Visit Diagnoses    Post-menopause    -  Primary   Relevant Orders   DG Bone Density   Estrogen deficiency       Relevant Orders   DG Bone Density   Colon cancer screening       Relevant Orders   Ambulatory referral to Gastroenterology   Hiatal hernia with GERD       Relevant Orders   Ambulatory referral to Gastroenterology   CBC   TSH   Need for pneumococcal vaccination       Relevant Orders   Pneumococcal polysaccharide vaccine 23-valent greater than or equal to 2yo subcutaneous/IM (Completed)      Mammo: Last completed on 06/09/2020, results showed left breast calcifications (probably benign), repeat every 6 months.  Colonoscopy: Last completed on 08/19/2006, results were normal (no polyps), repeat every 10 years.  Dexa: last completed on 01/28/2017, results were normal, repeat every 5 years  (but due to chemo, should be repeat sooner).   Pap smear: Last completed on 03/19/2019,  results were normal, repeat every 3 years.  Follow-up: Return in about 6 months (around 03/09/2021), or lab appt 9/29 then f/u visit after that.   I,David Hanna,acting as a scribe for Penni Homans, MD.,have documented all relevant documentation on the behalf of Penni Homans, MD,as directed by  Penni Homans, MD while in the presence of Penni Homans, MD.  I, Mosie Lukes, MD personally performed the services described in this documentation. All medical record entries made by the scribe were at my direction and in my presence. I have reviewed the chart and agree that the record reflects my personal performance and is accurate and complete

## 2020-09-06 NOTE — Patient Instructions (Signed)
Shingrix is the new shingles shot 2 shots over 2-6 months at [place] 65 Years and Older, Female Preventive care refers to lifestyle choices and visits with your health care provider that can promote health and wellness. This includes:  A yearly physical exam. This is also called an annual wellness visit.  Regular dental and eye exams.  Immunizations.  Screening for certain conditions.  Healthy lifestyle choices, such as: ? Eating a healthy diet. ? Getting regular exercise. ? Not using drugs or products that contain nicotine and tobacco. ? Limiting alcohol use. What can I expect for my preventive care visit? Physical exam Your health care provider will check your:  Height and weight. These may be used to calculate your BMI (body mass index). BMI is a measurement that tells if you are at a healthy weight.  Heart rate and blood pressure.  Body temperature.  Skin for abnormal spots. Counseling Your health care provider may ask you questions about your:  Past medical problems.  Family's medical history.  Alcohol, tobacco, and drug use.  Emotional well-being.  Home life and relationship well-being.  Sexual activity.  Diet, exercise, and sleep habits.  History of falls.  Memory and ability to understand (cognition).  Work and work Statistician.  Pregnancy and menstrual history.  Access to firearms. What immunizations do I need? Vaccines are usually given at various ages, according to a schedule. Your health care provider will recommend vaccines for you based on your age, medical history, and lifestyle or other factors, such as travel or where you work.   What tests do I need? Blood tests  Lipid and cholesterol levels. These may be checked every 5 years, or more often depending on your overall health.  Hepatitis C test.  Hepatitis B test. Screening  Lung cancer screening. You may have this screening every year starting at age 84 if you  have a 30-pack-year history of smoking and currently smoke or have quit within the past 15 years.  Colorectal cancer screening. ? All adults should have this screening starting at age 45 and continuing until age 32. ? Your health care provider may recommend screening at age 58 if you are at increased risk. ? You will have tests every 1-10 years, depending on your results and the type of screening test.  Diabetes screening. ? This is done by checking your blood sugar (glucose) after you have not eaten for a while (fasting). ? You may have this done every 1-3 years.  Mammogram. ? This may be done every 1-2 years. ? Talk with your health care provider about how often you should have regular mammograms.  Abdominal aortic aneurysm (AAA) screening. You may need this if you are a current or former smoker.  BRCA-related cancer screening. This may be done if you have a family history of breast, ovarian, tubal, or peritoneal cancers. Other tests  STD (sexually transmitted disease) testing, if you are at risk.  Bone density scan. This is done to screen for osteoporosis. You may have this done starting at age 46. Talk with your health care provider about your test results, treatment options, and if necessary, the need for more tests. Follow these instructions at home: Eating and drinking  Eat a diet that includes fresh fruits and vegetables, whole grains, lean protein, and low-fat dairy products. Limit your intake of foods with high amounts of sugar, saturated fats, and salt.  Take vitamin and mineral supplements as recommended by your health care provider.  Do not drink alcohol if your health care provider tells you not to drink.  If you drink alcohol: ? Limit how much you have to 0-1 drink a day. ? Be aware of how much alcohol is in your drink. In the [place] one drink equals one 12 oz bottle of beer (355 mL), one 5 oz glass of wine (148 mL), or one 1 oz glass of hard liquor (44 mL).    Lifestyle  Take daily care of your teeth and gums. Brush your teeth every morning and night with fluoride toothpaste. Floss one time each day.  Stay active. Exercise for at least 30 minutes 5 or more days each week.  Do not use any products that contain nicotine or tobacco, such as cigarettes, e-cigarettes, and chewing tobacco. If you need help quitting, ask your health care provider.  Do not use drugs.  If you are sexually active, practice safe sex. Use a condom or other form of protection in order to prevent STIs (sexually transmitted infections).  Talk with your health care provider about taking a low-dose aspirin or statin.  Find healthy ways to cope with stress, such as: ? Meditation, yoga, or listening to music. ? Journaling. ? Talking to a trusted person. ? Spending time with friends and family. Safety  Always wear your seat belt while driving or riding in a vehicle.  Do not drive: ? If you have been drinking alcohol. Do not ride with someone who has been drinking. ? When you are tired or distracted. ? While texting.  Wear a helmet and other protective equipment during sports activities.  If you have firearms in your house, make sure you follow all gun safety procedures. What's next?  Visit your health care provider once a year for an annual wellness visit.  Ask your health care provider how often you should have your eyes and teeth checked.  Stay up to date on all vaccines. This information is not intended to replace advice given to you by your health care provider. Make sure you discuss any questions you have with your health care provider. Document Revised: 05/18/2020 Document Reviewed: 05/22/2018 Elsevier Patient Education  2021 [employer].

## 2020-09-07 ENCOUNTER — Encounter: Payer: Self-pay | Admitting: Family Medicine

## 2020-09-07 DIAGNOSIS — E785 Hyperlipidemia, unspecified: Secondary | ICD-10-CM | POA: Insufficient documentation

## 2020-09-07 NOTE — Assessment & Plan Note (Signed)
hgba1c acceptable, minimize simple carbs. Increase exercise as tolerated.  

## 2020-09-07 NOTE — Assessment & Plan Note (Signed)
She tolerated her chemo and radiation and is feeling much better now. Is walking more and eating better. Will continue to follow up with oncology

## 2020-09-07 NOTE — Assessment & Plan Note (Signed)
Encouraged heart healthy diet, increase exercise, avoid trans fats, consider a krill oil cap daily 

## 2020-09-07 NOTE — Assessment & Plan Note (Signed)
Patient encouraged to maintain heart healthy diet, regular exercise, adequate sleep. Consider daily probiotics. Take medications as prescribed. Labs ordered and reviewed. dexa scan ordered, given pneumonia shot, referred for colonoscopy.

## 2020-09-08 MED ORDER — FLUOXETINE HCL 40 MG PO CAPS: 40.0000 mg | ORAL_CAPSULE | Freq: Every day | ORAL | 2 refills | Status: DC

## 2020-09-12 ENCOUNTER — Other Ambulatory Visit: Payer: Self-pay

## 2020-09-12 ENCOUNTER — Ambulatory Visit (HOSPITAL_BASED_OUTPATIENT_CLINIC_OR_DEPARTMENT_OTHER)
Admission: RE | Admit: 2020-09-12 | Discharge: 2020-09-12 | Disposition: A | Discharge status: Home / Self Care | Payer: PPO | Source: Ambulatory Visit | Attending: Family Medicine | Admitting: Family Medicine

## 2020-09-12 DIAGNOSIS — E2839 Other primary ovarian failure: Secondary | ICD-10-CM | POA: Insufficient documentation

## 2020-09-12 DIAGNOSIS — Z780 Asymptomatic menopausal state: Secondary | ICD-10-CM | POA: Diagnosis not present

## 2020-12-09 ENCOUNTER — Other Ambulatory Visit: Payer: Self-pay | Admitting: Hematology

## 2020-12-09 ENCOUNTER — Other Ambulatory Visit: Payer: Self-pay

## 2020-12-09 ENCOUNTER — Ambulatory Visit
Admission: RE | Admit: 2020-12-09 | Discharge: 2020-12-09 | Disposition: A | Discharge status: Home / Self Care | Payer: PPO | Source: Ambulatory Visit | Attending: Nurse Practitioner | Admitting: Nurse Practitioner

## 2020-12-09 DIAGNOSIS — R921 Mammographic calcification found on diagnostic imaging of breast: Secondary | ICD-10-CM

## 2020-12-09 DIAGNOSIS — R922 Inconclusive mammogram: Secondary | ICD-10-CM | POA: Diagnosis not present

## 2020-12-09 DIAGNOSIS — Z9889 Other specified postprocedural states: Secondary | ICD-10-CM

## 2020-12-28 ENCOUNTER — Telehealth: Payer: Self-pay | Admitting: Nurse Practitioner

## 2020-12-28 NOTE — Telephone Encounter (Signed)
Moved upcoming appointment due to provider's template. Patient is aware of changes. 

## 2021-01-24 DIAGNOSIS — Z1211 Encounter for screening for malignant neoplasm of colon: Secondary | ICD-10-CM | POA: Diagnosis not present

## 2021-01-24 LAB — FECAL OCCULT BLOOD, GUAIAC: Fecal Occult Blood: NEGATIVE

## 2021-01-31 NOTE — Progress Notes (Signed)
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  SUMMARY OF ONCOLOGIC HISTORY: Oncology History Overview Note  Cancer Staging Malignant neoplasm of upper-inner quadrant of left breast in female, estrogen receptor negative ([place]) Staging form: Breast, AJCC 8th Edition - Clinical [name] from 02/06/2019: [name] IB (cT1c, cN0, cM0, G2, ER-, PR-, HER2-) - Signed by Cindy Merle, MD on 02/17/2019 - Pathologic [name] from 03/10/2019: [name] IIA (pT2, pN0, cM0, G3, ER-, PR-, HER2-) - Signed by Cindy Merle, MD on 03/12/2019    Malignant neoplasm of upper-inner quadrant of left breast in female, estrogen receptor negative ([place])  02/04/2019 Mammogram   Diagnostic mammogram 02/04/19  IMPRESSION: Highly suspicious 1.7 x1.5 x1.7cm at 10:00 position of UPPER INNER LEFT breast mass 10 cm from nipple corresponding to the screening study finding. Tissue sampling recommended.   No abnormal LEFT axillary lymph nodes.   02/06/2019 Cancer Staging   Staging form: Breast, AJCC 8th Edition - Clinical [name] from 02/06/2019: [name] IB (cT1c, cN0, cM0, G2, ER-, PR-, HER2-) - Signed by Cindy Merle, MD on 02/17/2019   02/06/2019 Initial Biopsy   Diagnosis 02/06/19 Breast, left, needle core biopsy, 10 o'clock, 10cm/n, ribbon clip - INVASIVE MAMMARY CARCINOMA.   02/06/2019 Receptors her2   Results: IMMUNOHISTOCHEMICAL AND MORPHOMETRIC ANALYSIS PERFORMED MANUALLY The tumor cells are NEGATIVE for Her2  (1+). Estrogen Receptor: 0%, NEGATIVE Progesterone Receptor: 0%, NEGATIVE Proliferation Marker Ki67: 40%   02/17/2019 Initial Diagnosis   Malignant neoplasm of upper-inner quadrant of left breast in female, estrogen receptor negative ([place])   03/10/2019 Cancer Staging   Staging form: Breast, AJCC 8th Edition - Pathologic [name] from 03/10/2019: [name] IIA (pT2, pN0, cM0, G3, ER-, PR-, HER2-) - Signed by Cindy Merle, MD on 03/12/2019   03/10/2019 Surgery   LEFT BREAST LUMPECTOMY WITH RADIOACTIVE SEED AND LEFT SENTINEL LYMPH NODE MAPPING and PAC placement  by Dr. Brantley [name]  03/10/19   03/10/2019 Pathology Results   DIAGNOSIS: 03/10/19  A. BREAST, LEFT, LUMPECTOMY:  - Invasive ductal carcinoma, grade 3, 2.5 cm  - Carcinoma is 0.3 cm from superior margin  - Medial resection edge is 0.1 cm from carcinoma (final medial margin is  represented by part 2 which is negative for carcinoma)  - Negative for lymphovascular or perineural invasion  - Biopsy site changes  - See oncology table   B. BREAST, LEFT MEDIAL MARGIN, EXCISION:  - Benign breast parenchyma, negative for carcinoma   C. LYMPH NODE, LEFT AXILLARY, SENTINEL BIOPSY:  - Lymph node, negative for carcinoma (0/1)    04/06/2019 Imaging   Bone scan IMPRESSION: Mild focus of uptake is seen involving the anterior portion of left upper rib, but there is no corresponding abnormality seen on the CT scan of the same day. Potentially this may represent degenerative change or secondary to overlying postsurgical change. Attention on follow-up is recommended.   No definite scintigraphic evidence of osseous metastases.   04/06/2019 Imaging   CT CAP IMPRESSION: 1. Surgical changes left breast with borderline enlarged left axillary lymph node. No other definite evidence for metastatic  disease in the chest, abdomen, or pelvis. 2. Tiny bilateral pulmonary nodules. Attention on follow-up recommended. 3. Scattered small hypoattenuating lesions in the  liver parenchyma. Some of these have attenuation higher than would be expected for simple fluid and may be cysts complicated by proteinaceous debris or hemorrhage. MRI without and with contrast recommended to further evaluate. 4. Small to moderate hiatal hernia. 5. Aortic Atherosclerosis (ICD10-I70.0).   04/08/2019 - 09/14/2019 Chemotherapy   chemo AC q2weeks for 4 cycles starting 04/08/19-05/20/19 followed by Taxol weekly for 12 weeks starting 06/09/19.     09/16/2019 Imaging   CT CAP w contrast IMPRESSION: 1. No evidence for metastatic disease in the chest, abdomen, or pelvis. 2. No change in the tiny bilateral pulmonary nodules, likely benign. Attention on follow-up suggested. 3. Stable appearance of multiple hypoattenuating liver lesions. None of these lesions show suspicious enhancement on today's study in there probably a combination of simple and complex cysts. There are several tiny liver lesions too small to characterize on today's study. Continued attention on follow-up recommended. 4. Small hiatal hernia. 5.  Aortic Atherosclerois (ICD10-170.0)   10/07/2019 - 11/04/2019 Radiation Therapy   Adjuvant RT with Dr Cindy [name] 10/07/19-11/04/19   11/25/2019 Procedure   PAC removed by Dr Cindy [name]     CURRENT THERAPY: Surveillance  INTERVAL HISTORY: Cindy [name] returns for follow-up as scheduled.  She was last seen by Cindy [name] 08/01/2020.  DEXA 09/2020 was normal. 47-monthrecall left mammo 12/09/2020 showed probably benign calcifications in the left breast, she is scheduled for bilateral mammo 05/29/2021.  She is doing well overall.  Has intermittent rare left breast pain.  Denies new lump/mass, nipple discharge or inversion, or skin change.  Denies bone or joint pain, abdominal pain or bloating, unexplainable fatigue, or unintentional weight loss.  She notes it took about 7 months to recover from chemo.  All other systems were reviewed with the patient and are negative.  MEDICAL HISTORY:   Past Medical History:  Diagnosis Date   Allergy    Depression    ? if bipolar works the best   Elevated fasting blood sugar    hx   H/O measles    Headache    Hiatal hernia    History of chicken pox    Hx of migraines    Microscopic hematuria    advised to see urology ? never went   Personal history of chemotherapy    Personal history of radiation therapy    Preventative health care 06/20/2011   Radiation dermatitis    chest    SURGICAL HISTORY: Past Surgical History:  Procedure Laterality Date   BREAST BIOPSY Left 01/2019   BREAST LUMPECTOMY Left 02/2019   BREAST LUMPECTOMY WITH RADIOACTIVE SEED AND SENTINEL LYMPH NODE BIOPSY Left 03/10/2019   Procedure: LEFT BREAST LUMPECTOMY WITH RADIOACTIVE SEED AND LEFT SENTINEL LYMPH NODE MAPPING;  Surgeon: CErroll Luna MD;  [facility]  Service: General;  Laterality: Left;   endometrial polyps     2 surgeries by Dr GCathlean Cower  EYE SURGERY  04/29/2009   Rt eye tighten up   PORT-A-CATH REMOVAL Right 11/25/2019   Procedure: PORT REMOVAL;  Surgeon: CErroll Luna MD;  [facility]  Service: General;  Laterality: Right;   PORTACATH PLACEMENT N/A 03/10/2019   Procedure: INSERTION PORT-A-CATH WITH ULTRASOUND;  Surgeon: CErroll Luna MD;  Location[facility];  Service: General;  Laterality: N/A;   steel plate in skull Left    from a crushed skull  I have reviewed the social history and family history with the patient and they are unchanged from previous note.  ALLERGIES:  is allergic to bromfed and venlafaxine.  MEDICATIONS:  Current Outpatient Medications  Medication Sig Dispense Refill   Ascorbic Acid (VITAMIN C PO) Take by mouth.     BIOTIN PO Take by mouth.     cholecalciferol (VITAMIN D3) 25 MCG (1000 UNIT) tablet Take 1,000 Units by mouth daily.     FLUoxetine (PROZAC) 40 MG capsule Take 1 capsule (40 mg total) by mouth daily. 90 capsule 2   Multiple Vitamin (MULTIVITAMIN WITH MINERALS) TABS  tablet Take 1 tablet by mouth daily.     No current facility-administered medications for this visit.    PHYSICAL EXAMINATION: ECOG PERFORMANCE STATUS: 0 - Asymptomatic  Vitals:   02/01/21 1203  BP: 117/71  Pulse: 88  Resp: 18  Temp: 98.3 F (36.8 C)  SpO2: 96%   Filed Weights   02/01/21 1203  Weight: 157 lb 14.4 oz (71.6 kg)    GENERAL:alert, no distress and comfortable SKIN: No rash EYES: sclera clear NECK: Without mass LYMPH:  no palpable cervical or supraclavicular lymphadenopathy  LUNGS:  normal breathing effort HEART: no lower extremity edema ABDOMEN:abdomen soft, non-tender and normal bowel sounds Musculoskeletal: Nonfocal NEURO: alert & oriented x 3 with fluent speech, no focal motor/sensory deficits Breast exam: No bilateral nipple discharge or inversion.  S/p left lumpectomy, incisions completely healed.  No palpable mass in either breast or axilla that I could appreciate.  LABORATORY DATA:  I have reviewed the data as listed CBC Latest Ref Rng & Units 02/01/2021 09/02/2020 08/01/2020  WBC 4.0 - 10.5 K/uL 5.6 4.7 5.7  Hemoglobin 12.0 - 15.0 g/dL 13.6 13.7 13.3  Hematocrit 36.0 - 46.0 % 40.7 39.3 39.3  Platelets 150 - 400 K/uL 201 233.0 198     CMP Latest Ref Rng & Units 02/01/2021 09/02/2020 08/01/2020  Glucose 70 - 99 mg/dL 97 120(H) 115(H)  BUN 8 - 23 mg/dL 15 16 26(H)  Creatinine 0.44 - 1.00 mg/dL 0.87 0.75 0.81  Sodium 135 - 145 mmol/L 140 140 141  Potassium 3.5 - 5.1 mmol/L 4.8 4.9 4.8  Chloride 98 - 111 mmol/L 104 103 105  CO2 22 - 32 mmol/L _0 Calcium 8.9 - 10.3 mg/dL 9.4 9.4 9.6  Total Protein 6.5 - 8.1 g/dL 7.0 6.7 7.3  Total Bilirubin 0.3 - 1.2 mg/dL 0.4 0.5 0.2(L)  Alkaline Phos 38 - 126 U/L 62 61 65  AST 15 - 41 U/L _1 ALT 0 - 44 U/L _2 RADIOGRAPHIC STUDIES: I have personally reviewed the radiological images as listed and agreed with the findings in the report. No results found.   ASSESSMENT & PLAN: 70 year old  female  1. Malignant neoplasm of upper-inner quadrant of left breast, [name] IIA, p(T2N0M0), ER/PR/HER2-, triple negative, Grade III -She was diagnosed in 02/2019. She underwent left breast lumpectomy with SLNB on 03/10/19.  -S/p adjuvant AC-T from 04/08/2019-09/14/2019 followed by adjuvant radiation per Dr. Sondra [name] which she completed in 10/2019. Recovered.  -diagnostic bilateral mammo 02/19/20 negative.  Left breast mammo 06/09/2020 and 12/09/2020 shows stable calcifications  -continue surveillance and healthy lifestyle practices  2.  Bone health -DEXA 09/12/2020 showed lowest T score -0.9 in the AP spine, normal -Continue calcium, vitamin D, and weightbearing exercise   Disposition: Ms. Vickroy is clinically doing well.  CBC and CMP are normal, breast exam  is unremarkable.  Last mammogram showed stable calcifications, repeat 05/2021.  Overall there is no clinical concern for breast cancer recurrence.  Continue surveillance.  Return for routine visit in 6 months, or sooner if needed.  We reviewed signs and symptoms of recurrence.  All questions were answered. The patient knows to call the clinic with any problems, questions or concerns. No barriers to learning were detected.     Alla Feeling, NP 02/01/21

## 2021-02-01 ENCOUNTER — Other Ambulatory Visit: Payer: PPO

## 2021-02-01 ENCOUNTER — Encounter: Payer: Self-pay | Admitting: Nurse Practitioner

## 2021-02-01 ENCOUNTER — Inpatient Hospital Stay: Payer: PPO | Attending: Nurse Practitioner

## 2021-02-01 ENCOUNTER — Other Ambulatory Visit: Payer: Self-pay

## 2021-02-01 ENCOUNTER — Inpatient Hospital Stay: Payer: PPO | Admitting: Nurse Practitioner

## 2021-02-01 ENCOUNTER — Ambulatory Visit: Payer: PPO | Admitting: Nurse Practitioner

## 2021-02-01 VITALS — BP 117/71 | HR 88 | Temp 98.3°F | Resp 18 | Wt 157.9 lb

## 2021-02-01 DIAGNOSIS — C50212 Malignant neoplasm of upper-inner quadrant of left female breast: Secondary | ICD-10-CM | POA: Diagnosis not present

## 2021-02-01 DIAGNOSIS — Z923 Personal history of irradiation: Secondary | ICD-10-CM | POA: Diagnosis not present

## 2021-02-01 DIAGNOSIS — K449 Diaphragmatic hernia without obstruction or gangrene: Secondary | ICD-10-CM | POA: Diagnosis not present

## 2021-02-01 DIAGNOSIS — I700 Atherosclerosis of aorta: Secondary | ICD-10-CM | POA: Insufficient documentation

## 2021-02-01 DIAGNOSIS — Z9221 Personal history of antineoplastic chemotherapy: Secondary | ICD-10-CM | POA: Insufficient documentation

## 2021-02-01 DIAGNOSIS — Z171 Estrogen receptor negative status [ER-]: Secondary | ICD-10-CM

## 2021-02-01 DIAGNOSIS — Z79899 Other long term (current) drug therapy: Secondary | ICD-10-CM | POA: Diagnosis not present

## 2021-02-01 LAB — CMP (CANCER CENTER ONLY)
ALT: 18 U/L (ref 0–44)
AST: 19 U/L (ref 15–41)
Albumin: 4 g/dL (ref 3.5–5.0)
Alkaline Phosphatase: 62 U/L (ref 38–126)
Anion gap: 9 (ref 5–15)
BUN: 15 mg/dL (ref 8–23)
CO2: 27 mmol/L (ref 22–32)
Calcium: 9.4 mg/dL (ref 8.9–10.3)
Chloride: 104 mmol/L (ref 98–111)
Creatinine: 0.87 mg/dL (ref 0.44–1.00)
GFR, Estimated: >60 mL/min (ref >60)
Glucose, Bld: 97 mg/dL (ref 70–99)
Potassium: 4.8 mmol/L (ref 3.5–5.1)
Sodium: 140 mmol/L (ref 135–145)
Total Bilirubin: 0.4 mg/dL (ref 0.3–1.2)
Total Protein: 7 g/dL (ref 6.5–8.1)

## 2021-02-01 LAB — CBC WITH DIFFERENTIAL (CANCER CENTER ONLY)
Abs Immature Granulocytes: 0.01 10*3/uL (ref 0.00–0.07)
Basophils Absolute: 0 10*3/uL (ref 0.0–0.1)
Basophils Relative: 0 %
Eosinophils Absolute: 0.1 10*3/uL (ref 0.0–0.5)
Eosinophils Relative: 2 %
HCT: 40.7 % (ref 36.0–46.0)
Hemoglobin: 13.6 g/dL (ref 12.0–15.0)
Immature Granulocytes: 0 %
Lymphocytes Relative: 35 %
Lymphs Abs: 1.9 10*3/uL (ref 0.7–4.0)
MCH: 32.1 pg (ref 26.0–34.0)
MCHC: 33.4 g/dL (ref 30.0–36.0)
MCV: 96 fL (ref 80.0–100.0)
Monocytes Absolute: 0.4 10*3/uL (ref 0.1–1.0)
Monocytes Relative: 7 %
Neutro Abs: 3.1 10*3/uL (ref 1.7–7.7)
Neutrophils Relative %: 56 %
Platelet Count: 201 10*3/uL (ref 150–400)
RBC: 4.24 MIL/uL (ref 3.87–5.11)
RDW: 12.8 % (ref 11.5–15.5)
WBC Count: 5.6 10*3/uL (ref 4.0–10.5)
nRBC: 0 % (ref 0.0–0.2)

## 2021-02-03 ENCOUNTER — Telehealth: Payer: Self-pay | Admitting: Nurse Practitioner

## 2021-02-03 NOTE — Telephone Encounter (Signed)
Scheduled follow-up appointment per 8/24 los. Patient is aware. 

## 2021-02-06 DIAGNOSIS — H52223 Regular astigmatism, bilateral: Secondary | ICD-10-CM | POA: Diagnosis not present

## 2021-02-06 DIAGNOSIS — H524 Presbyopia: Secondary | ICD-10-CM | POA: Diagnosis not present

## 2021-02-06 DIAGNOSIS — H2513 Age-related nuclear cataract, bilateral: Secondary | ICD-10-CM | POA: Diagnosis not present

## 2021-02-06 DIAGNOSIS — H43813 Vitreous degeneration, bilateral: Secondary | ICD-10-CM | POA: Diagnosis not present

## 2021-02-08 ENCOUNTER — Telehealth: Payer: Self-pay | Admitting: Family Medicine

## 2021-02-08 NOTE — Telephone Encounter (Signed)
Left message for patient to call back and schedule Medicare Annual Wellness Visit (AWV) in office.  ° °If not able to come in office, please offer to do virtually or by telephone.  Left office number and my jabber #[phone]. ° °Due for AWVI ° °Please schedule at anytime with Nurse Health Advisor. °  °

## 2021-02-21 ENCOUNTER — Ambulatory Visit: Payer: PPO

## 2021-03-09 ENCOUNTER — Other Ambulatory Visit: Payer: PPO

## 2021-03-28 IMAGING — MG MM CLIP PLACEMENT
4 series · 4 of 12 positions shown · non-contrast
Comparison: Previous exam(s).

CLINICAL DATA: Status post ultrasound-guided core biopsy of mass in
the LEFT breast 10 location.

EXAM:
DIAGNOSTIC LEFT MAMMOGRAM POST ULTRASOUND BIOPSY

[L ML synth-2D]
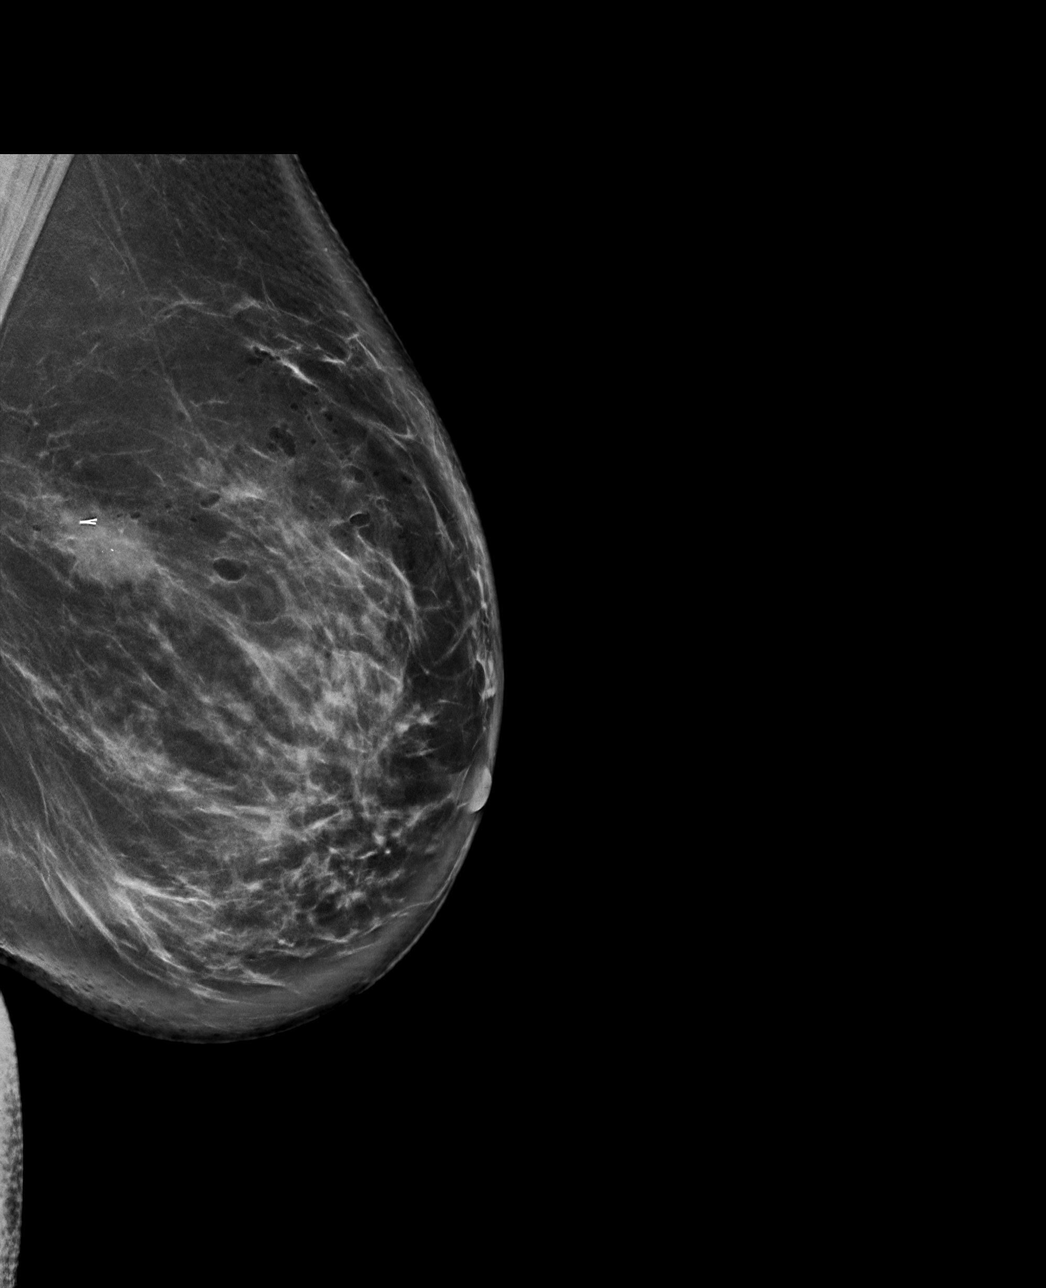

[L CC synth-2D]
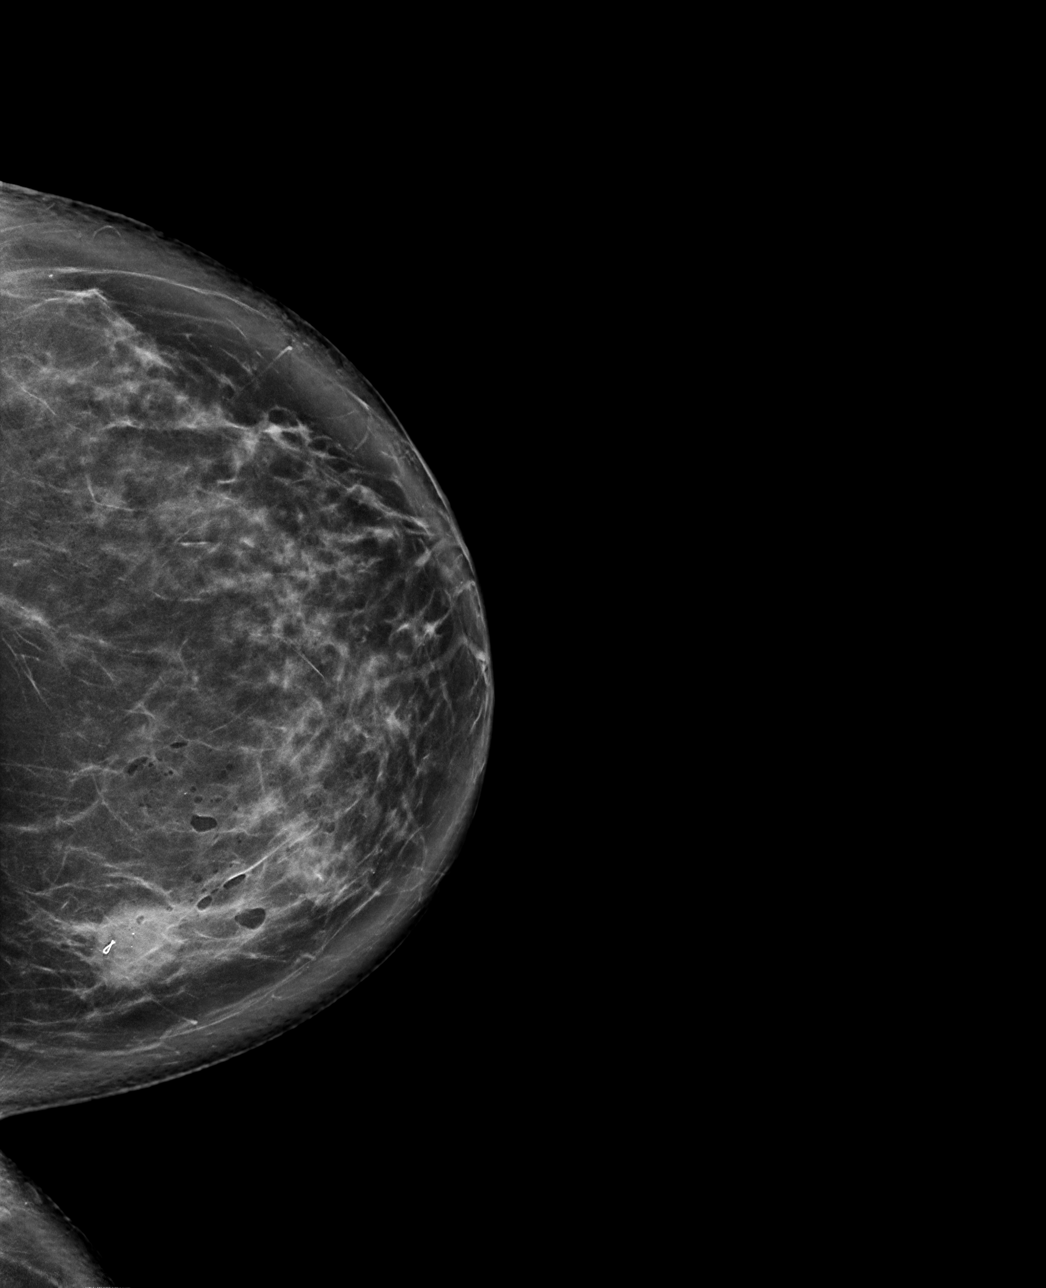

[L CC tomo · tomo slice 50/99.0]
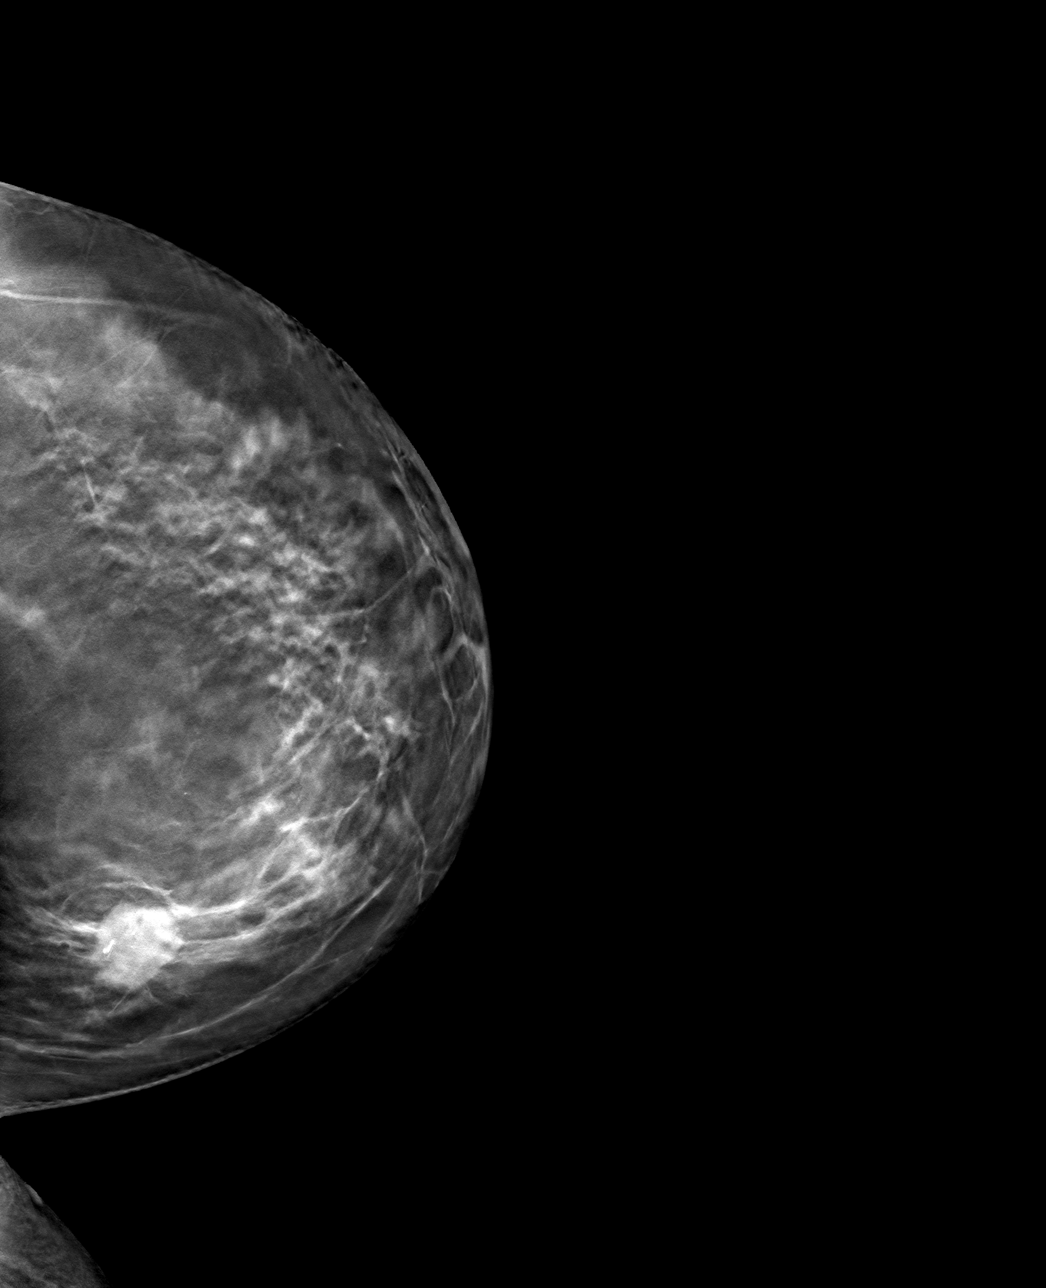

[L ML tomo · tomo slice 55/108.0]
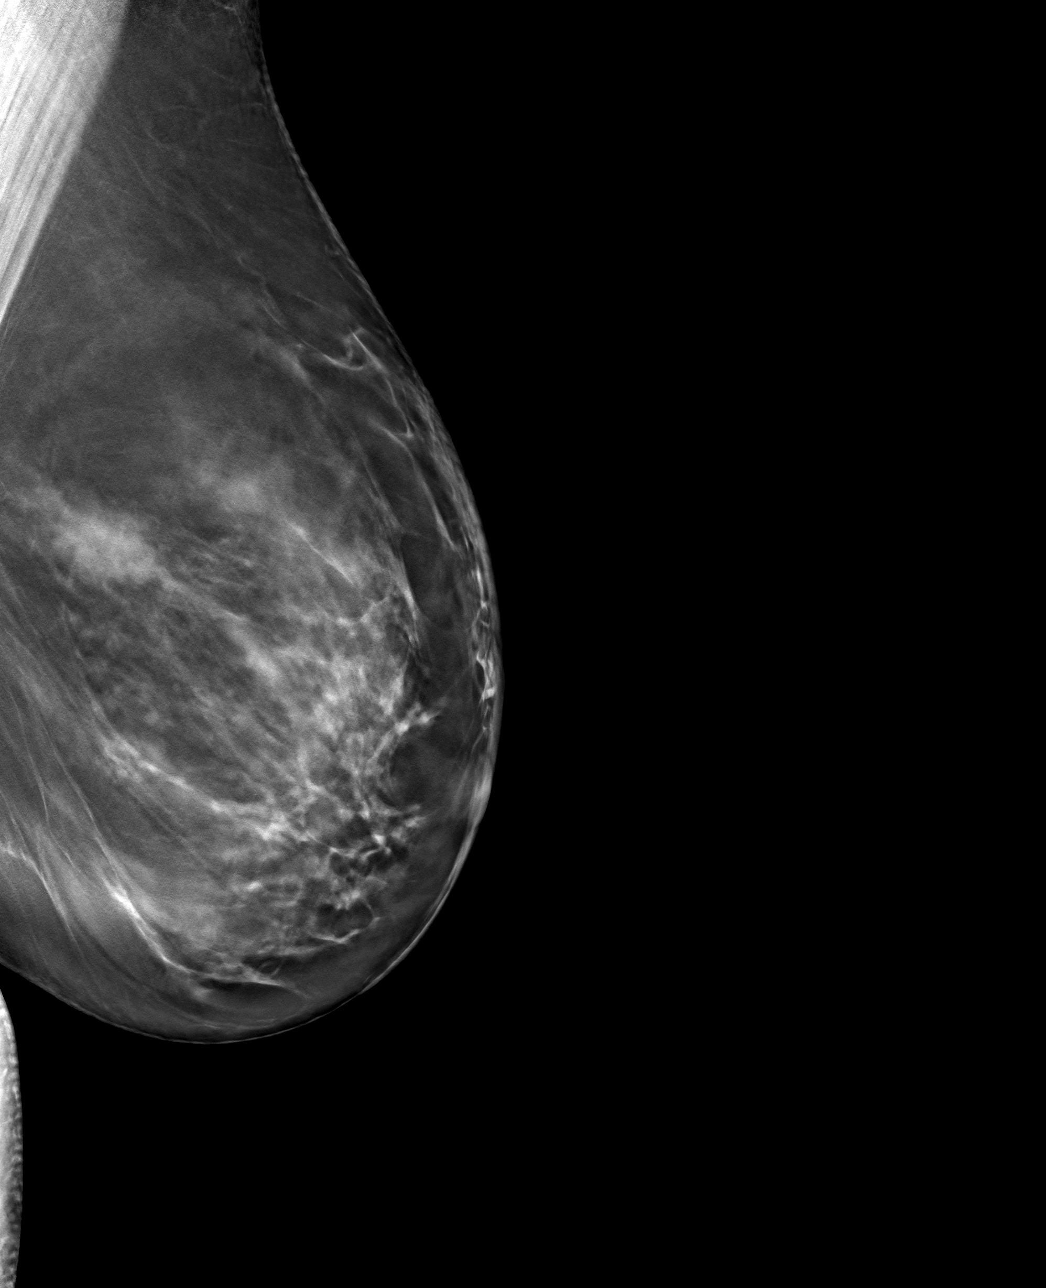

[4 of 12 positions shown; findings below may reference images not displayed]

FINDINGS: Mammographic images were obtained following ultrasound guided biopsy
of mass in the 10 o'clock location of the LEFT breast and placement
of a ribbon shaped clip. The clip is identified within the mass in
the UPPER INNER QUADRANT of the LEFT breast.
IMPRESSION: Tissue marker clip in the expected location following biopsy.

Final Assessment: Post Procedure Mammograms for Marker Placement

## 2021-03-31 ENCOUNTER — Other Ambulatory Visit (INDEPENDENT_AMBULATORY_CARE_PROVIDER_SITE_OTHER): Payer: PPO

## 2021-03-31 ENCOUNTER — Other Ambulatory Visit: Payer: Self-pay

## 2021-03-31 DIAGNOSIS — K449 Diaphragmatic hernia without obstruction or gangrene: Secondary | ICD-10-CM | POA: Diagnosis not present

## 2021-03-31 DIAGNOSIS — R7309 Other abnormal glucose: Secondary | ICD-10-CM

## 2021-03-31 DIAGNOSIS — E785 Hyperlipidemia, unspecified: Secondary | ICD-10-CM | POA: Diagnosis not present

## 2021-03-31 DIAGNOSIS — K219 Gastro-esophageal reflux disease without esophagitis: Secondary | ICD-10-CM

## 2021-03-31 LAB — COMPREHENSIVE METABOLIC PANEL
ALT: 13 U/L (ref 0–35)
AST: 16 U/L (ref 0–37)
Albumin: 4.1 g/dL (ref 3.5–5.2)
Alkaline Phosphatase: 52 U/L (ref 39–117)
BUN: 25 mg/dL — ABNORMAL HIGH (ref 6–23)
CO2: 28 mEq/L (ref 19–32)
Calcium: 9.3 mg/dL (ref 8.4–10.5)
Chloride: 106 mEq/L (ref 96–112)
Creatinine, Ser: 0.79 mg/dL (ref 0.40–1.20)
GFR: 76.58 mL/min (ref >60.00)
Glucose, Bld: 119 mg/dL — ABNORMAL HIGH (ref 70–99)
Potassium: 4.8 mEq/L (ref 3.5–5.1)
Sodium: 141 mEq/L (ref 135–145)
Total Bilirubin: 0.4 mg/dL (ref 0.2–1.2)
Total Protein: 6.3 g/dL (ref 6.0–8.3)

## 2021-03-31 LAB — CBC
HCT: 38.5 % (ref 36.0–46.0)
Hemoglobin: 13 g/dL (ref 12.0–15.0)
MCHC: 33.6 g/dL (ref 30.0–36.0)
MCV: 95.8 fl (ref 78.0–100.0)
Platelets: 212 10*3/uL (ref 150.0–400.0)
RBC: 4.02 Mil/uL (ref 3.87–5.11)
RDW: 13.8 % (ref 11.5–15.5)
WBC: 4.3 10*3/uL (ref 4.0–10.5)

## 2021-03-31 LAB — LIPID PANEL
Cholesterol: 193 mg/dL (ref 0–200)
HDL: 71.6 mg/dL (ref >39.00)
LDL Cholesterol: 110 mg/dL — ABNORMAL HIGH (ref 0–99)
NonHDL: 121.73
Total CHOL/HDL Ratio: 3
Triglycerides: 61 mg/dL (ref 0.0–149.0)
VLDL: 12.2 mg/dL (ref 0.0–40.0)

## 2021-03-31 LAB — HEMOGLOBIN A1C: Hgb A1c MFr Bld: 5.8 % (ref 4.6–6.5)

## 2021-03-31 LAB — TSH: TSH: 1.07 u[IU]/mL (ref 0.35–5.50)

## 2021-04-04 ENCOUNTER — Other Ambulatory Visit: Payer: Self-pay

## 2021-04-04 ENCOUNTER — Ambulatory Visit
Admission: RE | Admit: 2021-04-04 | Discharge: 2021-04-04 | Disposition: A | Discharge status: Home / Self Care | Payer: PPO | Source: Ambulatory Visit | Attending: Family Medicine | Admitting: Family Medicine

## 2021-04-04 ENCOUNTER — Ambulatory Visit (INDEPENDENT_AMBULATORY_CARE_PROVIDER_SITE_OTHER): Payer: PPO | Admitting: Family Medicine

## 2021-04-04 ENCOUNTER — Ambulatory Visit: Payer: PPO

## 2021-04-04 VITALS — BP 106/64 | HR 94 | Temp 98.5°F | Resp 16 | Wt 157.2 lb

## 2021-04-04 DIAGNOSIS — E785 Hyperlipidemia, unspecified: Secondary | ICD-10-CM | POA: Diagnosis not present

## 2021-04-04 DIAGNOSIS — R7309 Other abnormal glucose: Secondary | ICD-10-CM

## 2021-04-04 DIAGNOSIS — M545 Low back pain, unspecified: Secondary | ICD-10-CM | POA: Diagnosis not present

## 2021-04-04 DIAGNOSIS — M25511 Pain in right shoulder: Secondary | ICD-10-CM | POA: Diagnosis not present

## 2021-04-04 DIAGNOSIS — Z1211 Encounter for screening for malignant neoplasm of colon: Secondary | ICD-10-CM

## 2021-04-04 DIAGNOSIS — Z171 Estrogen receptor negative status [ER-]: Secondary | ICD-10-CM | POA: Diagnosis not present

## 2021-04-04 DIAGNOSIS — M25519 Pain in unspecified shoulder: Secondary | ICD-10-CM | POA: Insufficient documentation

## 2021-04-04 DIAGNOSIS — C50212 Malignant neoplasm of upper-inner quadrant of left female breast: Secondary | ICD-10-CM

## 2021-04-04 DIAGNOSIS — Z23 Encounter for immunization: Secondary | ICD-10-CM

## 2021-04-04 DIAGNOSIS — Z853 Personal history of malignant neoplasm of breast: Secondary | ICD-10-CM | POA: Insufficient documentation

## 2021-04-04 NOTE — Progress Notes (Signed)
Patient ID: Cindy [name], female    DOB: 01-21-1951  Age: 70 y.o. MRN: [id number]    Subjective:   Chief Complaint  Patient presents with   6 months follow up    Lower back and shoulder pain    Subjective   HPI Cindy [name] presents for office visit today for follow up on hyperlipidemia and hyperglycemia. She reports that two weeks ago she started experiencing back pain and a week before onset she experienced right shoulder pain. Currently, she notes that her back pain is improving overtime. For both right shoulder and back pain, it is worse in the morning and improves as she starts moving and stretching throughout the day and no discomfort with walking. She denies any recent falls, incontinence or loss of bladder control. Her right shoulder pain is not as bad as her back pain. Denies CP/palp/SOB/HA/congestion/fevers/GI or GU c/o. Taking meds as prescribed.   Review of Systems  Constitutional:  Negative for chills, fatigue and fever.  HENT:  Negative for congestion, rhinorrhea, sinus pressure, sinus pain, sore throat and trouble swallowing.   Eyes:  Negative for pain.  Respiratory:  Negative for cough and shortness of breath.   Cardiovascular:  Negative for chest pain, palpitations and leg swelling.  Gastrointestinal:  Negative for abdominal pain, blood in stool, diarrhea, nausea and vomiting.  Genitourinary:  Negative for decreased urine volume, flank pain, frequency, vaginal bleeding and vaginal discharge.  Musculoskeletal:  Negative for back pain.  Neurological:  Negative for headaches.   History Past Medical History:  Diagnosis Date   Allergy    Depression    ? if bipolar works the best   Elevated fasting blood sugar    hx   H/O measles    Headache    Hiatal hernia    History of chicken pox    Hx of migraines    Microscopic hematuria    advised to see urology ? never went   Personal history of chemotherapy    Personal history of radiation therapy     Preventative health care 06/20/2011   Radiation dermatitis    chest    She has a past surgical history that includes endometrial polyps; Eye surgery (04/29/2009); steel plate in skull (Left); Breast lumpectomy with radioactive seed and sentinel lymph node biopsy (Left, 03/10/2019); Portacath placement (N/A, 03/10/2019); Port-a-cath removal (Right, 11/25/2019); Breast biopsy (Left, 01/2019); and Breast lumpectomy (Left, 02/2019).   Her family history includes Alzheimer's disease in her mother; COPD in her father; Cancer in her father, maternal grandfather, and another family member; Cancer (age of onset: 97) in her mother; Thyroid disease in her father.She reports that she has quit smoking. She has a 2.50 pack-year smoking history. She has never used smokeless tobacco. She reports that she does not currently use alcohol after a past usage of about 1.0 - 2.0 standard drink per week. She reports that she does not use drugs.  Current Outpatient Medications on File Prior to Visit  Medication Sig Dispense Refill   Ascorbic Acid (VITAMIN C PO) Take by mouth.     BIOTIN PO Take by mouth.     cholecalciferol (VITAMIN D3) 25 MCG (1000 UNIT) tablet Take 1,000 Units by mouth daily.     FLUoxetine (PROZAC) 40 MG capsule Take 1 capsule (40 mg total) by mouth daily. 90 capsule 2   KRILL OIL PO Take by mouth.     Magnesium 200 MG TABS Take by mouth.     Multiple Vitamin (MULTIVITAMIN  WITH MINERALS) TABS tablet Take 1 tablet by mouth daily.     No current facility-administered medications on file prior to visit.     Objective:  Objective  Physical Exam Constitutional:      General: She is not in acute distress.    Appearance: Normal appearance. She is not ill-appearing or toxic-appearing.  HENT:     Head: Normocephalic and atraumatic.     Right Ear: Tympanic membrane, ear canal and external ear normal.     Left Ear: Tympanic membrane, ear canal and external ear normal.     Nose: No congestion or  rhinorrhea.  Eyes:     Extraocular Movements: Extraocular movements intact.     Pupils: Pupils are equal, round, and reactive to light.  Cardiovascular:     Rate and Rhythm: Normal rate and regular rhythm.     Pulses: Normal pulses.     Heart sounds: Normal heart sounds. No murmur heard. Pulmonary:     Effort: Pulmonary effort is normal. No respiratory distress.     Breath sounds: Normal breath sounds. No wheezing, rhonchi or rales.  Abdominal:     General: Bowel sounds are normal.     Palpations: Abdomen is soft. There is no mass.     Tenderness: There is no abdominal tenderness. There is no guarding.     Hernia: No hernia is present.  Musculoskeletal:        General: Normal range of motion.     Cervical back: Normal range of motion and neck supple.  Skin:    General: Skin is warm and dry.  Neurological:     Mental Status: She is alert and oriented to person, place, and time.  Psychiatric:        Behavior: Behavior normal.   BP 106/64   Pulse 94   Temp 98.5 F (36.9 C)   Resp 16   Wt 157 lb 3.2 oz (71.3 kg)   SpO2 98%   BMI 26.98 kg/m  Wt Readings from Last 3 Encounters:  04/04/21 157 lb 3.2 oz (71.3 kg)  02/01/21 157 lb 14.4 oz (71.6 kg)  09/06/20 154 lb 3.2 oz (69.9 kg)     Lab Results  Component Value Date   WBC 4.3 03/31/2021   HGB 13.0 03/31/2021   HCT 38.5 03/31/2021   PLT 212.0 03/31/2021   GLUCOSE 119 (H) 03/31/2021   CHOL 193 03/31/2021   TRIG 61.0 03/31/2021   HDL 71.60 03/31/2021   LDLDIRECT 123.1 06/25/2011   LDLCALC 110 (H) 03/31/2021   ALT 13 03/31/2021   AST 16 03/31/2021   NA 141 03/31/2021   K 4.8 03/31/2021   CL 106 03/31/2021   CREATININE 0.79 03/31/2021   BUN 25 (H) 03/31/2021   CO2 28 03/31/2021   TSH 1.07 03/31/2021   HGBA1C 5.8 03/31/2021    MM DIAG BREAST TOMO UNI LEFT  Result Date: 12/09/2020 CLINICAL DATA:  Short-term follow-up for probably benign left breast calcifications. Patient has history of a left lumpectomy for  breast carcinoma performed in September 2020 with adjuvant radiation therapy. EXAM: DIGITAL DIAGNOSTIC UNILATERAL LEFT MAMMOGRAM WITH TOMOSYNTHESIS AND CAD TECHNIQUE: Left digital diagnostic mammography and breast tomosynthesis was performed. The images were evaluated with computer-aided detection. COMPARISON:  Previous exam(s). ACR Breast Density Category c: The breast tissue is heterogeneously dense, which may obscure small masses. FINDINGS: The calcifications in the left breast appear more rod-like, more convincingly secretory. There are no masses. The patient is post lumpectomy site is stable.  IMPRESSION: Probably benign calcifications in the left breast, consistent with secretory calcifications. Additional short-term follow-up recommended. RECOMMENDATION: 1. Diagnostic mammography in 6 months with left breast magnification views. I have discussed the findings and recommendations with the patient. If applicable, a reminder letter will be sent to the patient regarding the next appointment. BI-RADS CATEGORY  3: Probably benign. Electronically Signed   By: Lajean Manes M.D.   On: 12/09/2020 13:39    Assessment & Plan:  Plan    No orders of the defined types were placed in this encounter.   Problem List Items Addressed This Visit     HYPERGLYCEMIA    hgba1c acceptable, minimize simple carbs. Increase exercise as tolerated.       Colon cancer screening - Primary    iFIT this year is negative. She agrees to proceed with either colonoscopy or Cologuard next year and to let us know if she has any bowel changes in the meantime      Malignant neoplasm of upper-inner quadrant of left breast in female, estrogen receptor negative ([place])   Hyperlipidemia    Encourage heart healthy diet such as MIND or DASH diet, increase exercise, avoid trans fats, simple carbohydrates and processed foods, consider a krill or fish or flaxseed oil cap daily.       History of breast cancer    Following closely with  oncology for surveillance but no active therapy at this time      Shoulder pain    Right shoulder pain, bothering her a couple of weeks now worse posteriorly and worst when sitting or resting for long periods of time. Proceed with xray Encouraged moist heat and gentle stretching as tolerated. May try NSAIDs and prescription meds as directed and report if symptoms worsen or seek immediate care. If it worsens and she is ready for referral she will let us know.       Relevant Orders   DG Shoulder Right   Low back pain    Encouraged moist heat and gentle stretching as tolerated. May try NSAIDs and prescription meds as directed and report if symptoms worsen or seek immediate care. One morning her back was very stiff and painful suddenly starting a couple of weeks ago. Feels fine while staying up and active. Hurts most first thing in am and better with movement.       Relevant Orders   DG Lumbar Spine 2-3 Views    Follow-up: Return in about 6 months (around 10/03/2021) for annual cpe, annual exam.  I, Suezanne Jacquet, acting as a scribe for Penni Homans, MD, have documented all relevent documentation on behalf of Penni Homans, MD, as directed by Penni Homans, MD while in the presence of Penni Homans, MD. DO:04/05/21.  I, Mosie Lukes, MD personally performed the services described in this documentation. All medical record entries made by the scribe were at my direction and in my presence. I have reviewed the chart and agree that the record reflects my personal performance and is accurate and complete

## 2021-04-04 NOTE — Assessment & Plan Note (Signed)
hgba1c acceptable, minimize simple carbs. Increase exercise as tolerated.  

## 2021-04-04 NOTE — Assessment & Plan Note (Addendum)
Right shoulder pain, bothering her a couple of weeks now worse posteriorly and worst when sitting or resting for long periods of time. Proceed with xray Encouraged moist heat and gentle stretching as tolerated. May try NSAIDs and prescription meds as directed and report if symptoms worsen or seek immediate care. If it worsens and she is ready for referral she will let us know.

## 2021-04-04 NOTE — Assessment & Plan Note (Signed)
Encouraged moist heat and gentle stretching as tolerated. May try NSAIDs and prescription meds as directed and report if symptoms worsen or seek immediate care. One morning her back was very stiff and painful suddenly starting a couple of weeks ago. Feels fine while staying up and active. Hurts most first thing in am and better with movement.

## 2021-04-04 NOTE — Patient Instructions (Addendum)
Get covid shot first then xray  Tylenol/Acetaminophen safest on kidneys to take  If you get injured call us to schedule for Tdap shot  Shingrix is the new shingles shot, 2 shots over 2-6 months, confirm coverage with insurance and document, then can return here for shots with nurse appt or at pharmacy  Molnupiravir/Paxlovid is the new COVID medication we can give you if you get COVID so make sure you test if you have symptoms because we have to treat by day 5 of symptoms for it to be effective. If you are positive let us know so we can treat. If a home test is negative and your symptoms are persistent get a PCR test. Can check testing locations at [email or web address]  If you are positive we will make an appointment with [place] and we will send in molnupiravir/paxlovid if you would like it. Check with your pharmacy before we meet to confirm they have it in stock, if they do not then we can get the prescription at the [facility].

## 2021-04-04 NOTE — Assessment & Plan Note (Signed)
Following closely with oncology for surveillance but no active therapy at this time

## 2021-04-04 NOTE — Assessment & Plan Note (Signed)
Encourage heart healthy diet such as MIND or DASH diet, increase exercise, avoid trans fats, simple carbohydrates and processed foods, consider a krill or fish or flaxseed oil cap daily.  °

## 2021-04-04 NOTE — Progress Notes (Signed)
   Covid-19 Vaccination Clinic  Name:  Cindy [name]    MRN: [id number] DOB: 11-19-1950  04/04/2021  Cindy [name] was observed post Covid-19 immunization for 15 minutes without incident. She was provided with Vaccine Information Sheet and instruction to access the V-Safe system.   Cindy [name] was instructed to call 911 with any severe reactions post vaccine: Difficulty breathing  Swelling of face and throat  A fast heartbeat  A bad rash all over body  Dizziness and weakness   Immunizations Administered     Name Date Dose VIS Date Route   Pfizer Covid-19 Vaccine Bivalent Booster 04/04/2021  2:06 PM 0.3 mL 02/08/2021 Intramuscular   Manufacturer: [place]   Lot: [id number]   [place]: [phone]

## 2021-04-05 NOTE — Assessment & Plan Note (Signed)
iFIT this year is negative. She agrees to proceed with either colonoscopy or Cologuard next year and to let us know if she has any bowel changes in the meantime

## 2021-04-27 ENCOUNTER — Telehealth (HOSPITAL_COMMUNITY): Payer: Self-pay

## 2021-04-27 NOTE — Telephone Encounter (Signed)
Erroneous encounter

## 2021-04-27 NOTE — Telephone Encounter (Signed)
Patient called with some concerns and wanting to schedule an appointment.  Patient has not been seen in a couple years.  Please contact patient with directions.

## 2021-04-28 ENCOUNTER — Other Ambulatory Visit (HOSPITAL_BASED_OUTPATIENT_CLINIC_OR_DEPARTMENT_OTHER): Payer: Self-pay

## 2021-04-28 MED ORDER — PFIZER COVID-19 VAC BIVALENT 30 MCG/0.3ML IM SUSP
INTRAMUSCULAR | 0 refills | Status: DC
  Filled 2021-04-28: qty 0.3, 1d supply, fill #0

## 2021-04-28 NOTE — Telephone Encounter (Signed)
Spoke with pt she recently has experienced some shortness of breath and would like a work in with [facility]. no appointments until January will send to Dr.Bensimhon to see if pt needs any testing (echo etc) before follow up.  Routed to Dr.Bensimhom

## 2021-04-29 IMAGING — MG MM BREAST SURGICAL SPECIMEN
1 series · 1 of 1 positions shown · non-contrast
Comparison: Previous exam(s).

CLINICAL DATA: Status post surgical excision the left breast
following radioactive seed localization.

EXAM:
SPECIMEN RADIOGRAPH OF THE LEFT BREAST

[L]
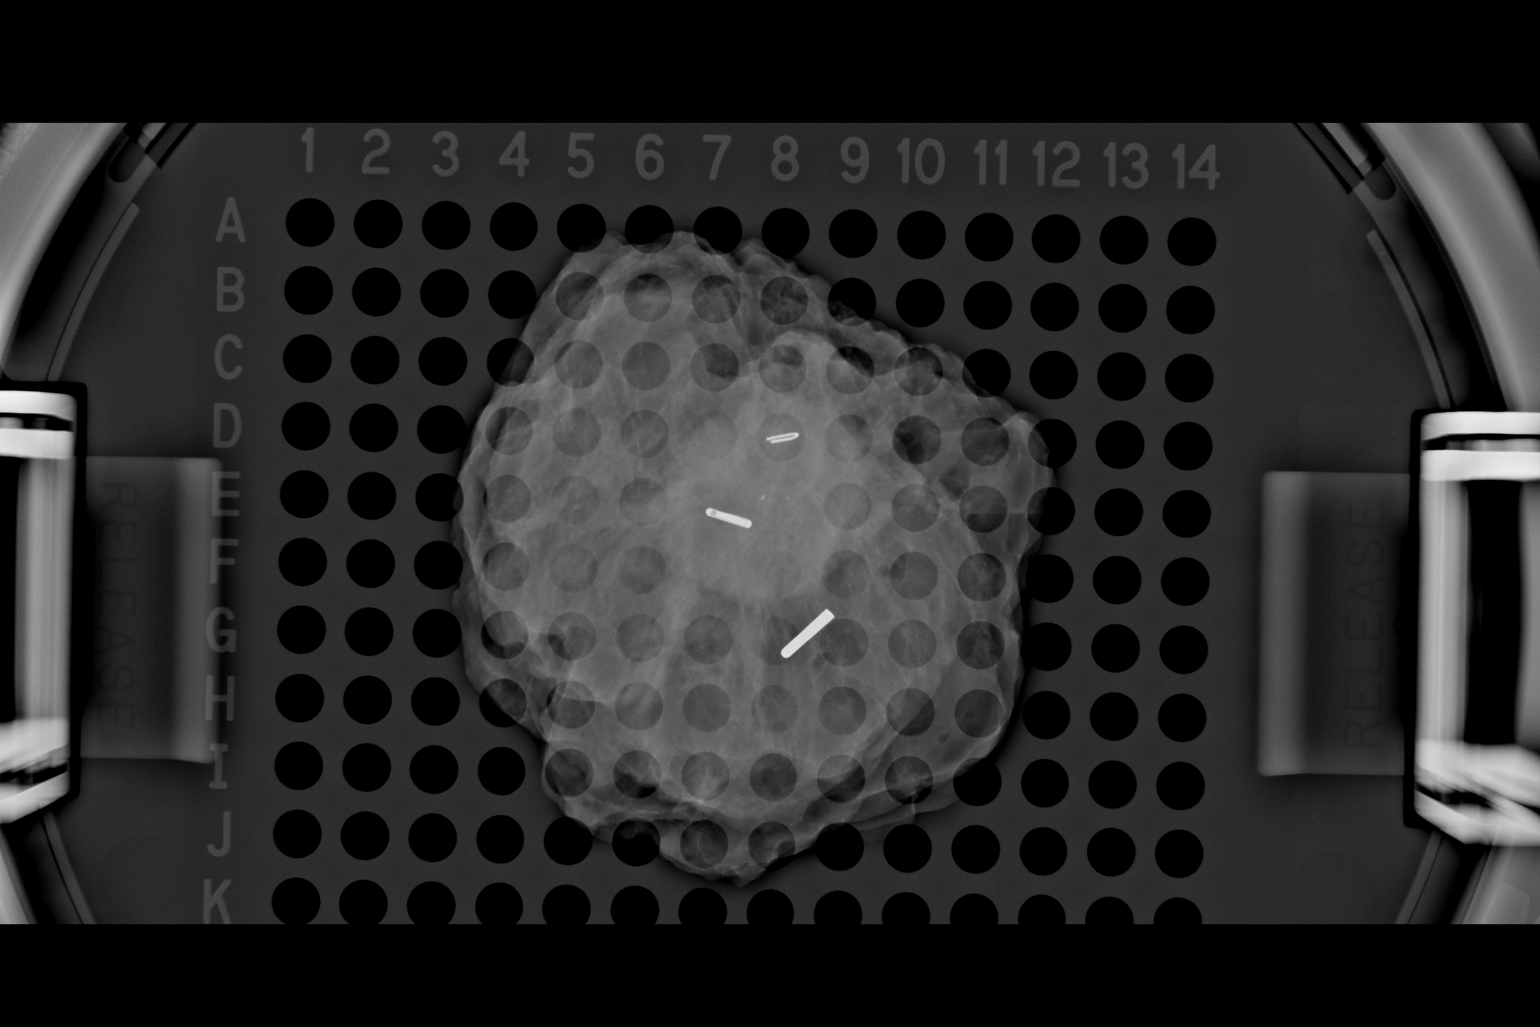

[1 of 1 positions shown; findings below may reference images not displayed]

FINDINGS: Status post excision of the left breast. The radioactive seed and
biopsy marker clip are present, completely intact, and were marked
for pathology.
IMPRESSION: Specimen radiograph of the left breast.

## 2021-05-01 NOTE — Telephone Encounter (Signed)
Echo and follow up scheduled message sent via mychart.

## 2021-05-29 ENCOUNTER — Ambulatory Visit
Admission: RE | Admit: 2021-05-29 | Discharge: 2021-05-29 | Disposition: A | Discharge status: Home / Self Care | Payer: PPO | Source: Ambulatory Visit | Attending: Hematology | Admitting: Hematology

## 2021-05-29 DIAGNOSIS — R921 Mammographic calcification found on diagnostic imaging of breast: Secondary | ICD-10-CM | POA: Diagnosis not present

## 2021-05-29 DIAGNOSIS — R922 Inconclusive mammogram: Secondary | ICD-10-CM | POA: Diagnosis not present

## 2021-05-29 DIAGNOSIS — Z9889 Other specified postprocedural states: Secondary | ICD-10-CM

## 2021-06-20 ENCOUNTER — Encounter: Payer: Self-pay | Admitting: Hematology

## 2021-06-21 ENCOUNTER — Encounter: Payer: Self-pay | Admitting: Hematology

## 2021-06-22 ENCOUNTER — Other Ambulatory Visit (HOSPITAL_COMMUNITY): Payer: Self-pay | Admitting: Surgery

## 2021-06-22 DIAGNOSIS — R0600 Dyspnea, unspecified: Secondary | ICD-10-CM

## 2021-06-27 ENCOUNTER — Ambulatory Visit (HOSPITAL_COMMUNITY)
Admission: RE | Admit: 2021-06-27 | Discharge: 2021-06-27 | Disposition: A | Discharge status: Home / Self Care | Payer: PPO | Source: Ambulatory Visit | Attending: Family Medicine | Admitting: Family Medicine

## 2021-06-27 ENCOUNTER — Other Ambulatory Visit: Payer: Self-pay

## 2021-06-27 ENCOUNTER — Ambulatory Visit (HOSPITAL_BASED_OUTPATIENT_CLINIC_OR_DEPARTMENT_OTHER)
Admission: RE | Admit: 2021-06-27 | Discharge: 2021-06-27 | Disposition: A | Discharge status: Home / Self Care | Payer: PPO | Source: Ambulatory Visit | Attending: Internal Medicine | Admitting: Internal Medicine

## 2021-06-27 ENCOUNTER — Ambulatory Visit (HOSPITAL_COMMUNITY)
Admission: RE | Admit: 2021-06-27 | Discharge: 2021-06-27 | Disposition: A | Discharge status: Home / Self Care | Payer: PPO | Source: Ambulatory Visit | Attending: Internal Medicine | Admitting: Internal Medicine

## 2021-06-27 ENCOUNTER — Encounter (HOSPITAL_COMMUNITY): Payer: Self-pay | Admitting: Internal Medicine

## 2021-06-27 VITALS — BP 98/60 | HR 70 | Wt 158.0 lb

## 2021-06-27 DIAGNOSIS — Z9221 Personal history of antineoplastic chemotherapy: Secondary | ICD-10-CM | POA: Diagnosis not present

## 2021-06-27 DIAGNOSIS — Z923 Personal history of irradiation: Secondary | ICD-10-CM | POA: Diagnosis not present

## 2021-06-27 DIAGNOSIS — R0602 Shortness of breath: Secondary | ICD-10-CM | POA: Diagnosis not present

## 2021-06-27 DIAGNOSIS — Z803 Family history of malignant neoplasm of breast: Secondary | ICD-10-CM | POA: Diagnosis not present

## 2021-06-27 DIAGNOSIS — Z853 Personal history of malignant neoplasm of breast: Secondary | ICD-10-CM | POA: Diagnosis not present

## 2021-06-27 DIAGNOSIS — I700 Atherosclerosis of aorta: Secondary | ICD-10-CM | POA: Insufficient documentation

## 2021-06-27 DIAGNOSIS — F419 Anxiety disorder, unspecified: Secondary | ICD-10-CM | POA: Diagnosis not present

## 2021-06-27 DIAGNOSIS — M1990 Unspecified osteoarthritis, unspecified site: Secondary | ICD-10-CM | POA: Diagnosis not present

## 2021-06-27 DIAGNOSIS — R079 Chest pain, unspecified: Secondary | ICD-10-CM | POA: Diagnosis not present

## 2021-06-27 DIAGNOSIS — R0600 Dyspnea, unspecified: Secondary | ICD-10-CM | POA: Insufficient documentation

## 2021-06-27 DIAGNOSIS — Z87891 Personal history of nicotine dependence: Secondary | ICD-10-CM | POA: Insufficient documentation

## 2021-06-27 DIAGNOSIS — R0609 Other forms of dyspnea: Secondary | ICD-10-CM

## 2021-06-27 DIAGNOSIS — F32A Depression, unspecified: Secondary | ICD-10-CM | POA: Insufficient documentation

## 2021-06-27 DIAGNOSIS — G43909 Migraine, unspecified, not intractable, without status migrainosus: Secondary | ICD-10-CM | POA: Diagnosis not present

## 2021-06-27 LAB — ECHOCARDIOGRAM COMPLETE
AR max vel: 3.42 cm2
AV Area VTI: 3.27 cm2
AV Area mean vel: 3.32 cm2
AV Mean grad: 2 mmHg
AV Peak grad: 3 mmHg
Ao pk vel: 0.87 m/s
Area-P 1/2: 3.2 cm2
S' Lateral: 3.3 cm

## 2021-06-27 NOTE — Progress Notes (Signed)
CARDIO-ONCOLOGY CLINIC  CONSULT NOTE  Referring Physician: Dr. Burr Medico Primary Care: Dr. Penni Homans Oncology: Dr. Burr Medico  HPI:  Cindy [name] is a 71 y.o. female former [employer] employee with h/o depression and migraine HAs as well as left breast cancer referred by Dr. Burr Medico for evaluation of CP  We saw her in 2020 as part of the [facility] for echo surveillance during chemo  Completed chemo AC q2weeks for 4 cycles starting 04/08/19 followed by Taxol for 2-3 months. Completed 4/21 followed by XRT  As part of pre-chemo w/u had echo that was read as EF 50-55% with possible mild apical HK. Denies any h/o heart disease or significant CRFs. Smoked but quit at age 29s.   10/20 ECHO: EF 60-65% with grade 2 DD no RWMA. On the strain imaging mild decrease in apical strain due to poor endocardial tracking (it is normal)  05/15/19 Echo EF 65-70% RV normal. GLS not accurate   Referred back today for further evaluation of SOB. Started in late fall. Very minor. Can happen at any time. Says she can get out and walk with no problem. Not very active. No edema, orthopnea or PND. Mild cough. No CP. No wheezing. Chest CT 4/21. Normal. + coronary calcifications. Has not had CXR  Echo today 06/27/21 EF   Here for f/u. EF 00-34% Normal diastolic parameters  Review of Systems: [y] = yes, [ ]  = no      General: Weight gain [] ; Weight loss [ ] ; Anorexia [ ] ; Fatigue [ ] ; Fever [ ] ; Chills [ ] ; Weakness [ ]    Cardiac: Chest pain/pressure [ ] ; Resting SOB [ y]; Exertional SOB [y]; Orthopnea [ ] ; Pedal Edema [] ; Palpitations [ ] ; Syncope [ ] ; Presyncope [ ] ; Paroxysmal nocturnal dyspnea[ ]    Pulmonary: Cough [ ] ; Wheezing[ ] ; Hemoptysis[ ] ; Sputum [ ] ; Snoring [ ]    GI: Vomiting[ ] ; Dysphagia[ ] ; Melena[ ] ; Hematochezia [ ] ; Heartburn[ ] ; Abdominal pain [ ] ; Constipation [ ] ; Diarrhea [ ] ; BRBPR [ ]    GU: Hematuria[ ] ; Dysuria [ ] ; Nocturia[ ]  Vascular: Pain in legs with walking [ ] ; Pain in feet with lying flat [  ]; Non-healing sores [ ] ; Stroke [ ] ; TIA [ ] ; Slurred speech [ ] ;   Neuro: Headaches[ y]; Vertigo[ ] ; Seizures[ ] ; Paresthesias[ ] ;Blurred vision [ ] ; Diplopia [ ] ; Vision changes [ ]    Ortho/Skin: Arthritis [y]; Joint pain [y]; Muscle pain [ ] ; Joint swelling [ ] ; Back Pain [ ] ; Rash [ ]    Psych: Depression[y ]; Anxiety[ y]   Heme: Bleeding problems [ ] ; Clotting disorders [ ] ; Anemia [ ]    Endocrine: Diabetes [ ] ; Thyroid dysfunction[ ]     Past Medical History:  Diagnosis Date   Allergy    Depression    ? if bipolar works the best   Elevated fasting blood sugar    hx   H/O measles    Headache    Hiatal hernia    History of chicken pox    Hx of migraines    Microscopic hematuria    advised to see urology ? never went   Personal history of chemotherapy    Personal history of radiation therapy    Preventative health care 06/20/2011   Radiation dermatitis    chest    Current Outpatient Medications  Medication Sig Dispense Refill   Ascorbic Acid (VITAMIN C PO) Take by mouth.     BIOTIN PO Take by mouth.  cholecalciferol (VITAMIN D3) 25 MCG (1000 UNIT) tablet Take 1,000 Units by mouth daily.     COVID-19 mRNA bivalent vaccine, Pfizer, (PFIZER COVID-19 VAC BIVALENT) injection Inject into the muscle. 0.3 mL 0   FLUoxetine (PROZAC) 40 MG capsule Take 1 capsule (40 mg total) by mouth daily. 90 capsule 2   KRILL OIL PO Take by mouth.     Magnesium 300 MG CAPS Take 300 mg by mouth daily.     Multiple Vitamin (MULTIVITAMIN WITH MINERALS) TABS tablet Take 1 tablet by mouth daily.     No current facility-administered medications for this encounter.    Allergies  Allergen Reactions   Bromfed Shortness Of Breath   Venlafaxine     Doesn't remember reaction      Social History   Socioeconomic History   Marital status: Single    Spouse name: Not on file   Number of children: Not on file   Years of education: Not on file   Highest education level: Not on file  Occupational  History   Occupation: retired   Tobacco Use   Smoking status: Former    Packs/day: 0.25    Years: 10.00    Pack years: 2.50    Types: Cigarettes   Smokeless tobacco: Never   Tobacco comments:    social smoker in her 20-30's   Vaping Use   Vaping Use: Never used  Substance and Sexual Activity   Alcohol use: Not Currently    Alcohol/week: 1.0 - 2.0 standard drink    Types: 1 - 2 Glasses of wine per week    Comment: very rare on social occasions   Drug use: No   Sexual activity: Not on file  Other Topics Concern   Not on file  Social History Narrative   HH of 1   2 Cats   Caffeine 2 mugs   Exercise recently some, no major dietary restrictions at this time. Was vegan now eats chicken and poultry   Works at [employer] in [place] Strain: Not on [employer] Insecurity: Not on file  Transportation Needs: Not on file  Physical Activity: Not on file  Stress: Not on file  Social Connections: Not on file  Intimate Partner Violence: Not on file      Family History  Problem Relation Age of Onset   Alzheimer's disease Mother    Cancer Mother 71       colon   Thyroid disease Father    COPD Father    Cancer Father        bladder with cystectomy   Cancer Maternal Grandfather        colon cancer   Cancer Other        maternal grant granmother had breast cancer     Vitals:   06/27/21 1443  BP: 98/60  Pulse: 70  SpO2: 98%  Weight: 71.7 kg (158 lb)    ECG: NSR 75 No ST-T wave abnormalities. Personally reviewed   PHYSICAL EXAM: General:  Well appearing. No resp difficulty HEENT: normal Neck: supple. no JVD. Carotids 2+ bilat; no bruits. No lymphadenopathy or thryomegaly appreciated. Cor: PMI nondisplaced. Regular rate & rhythm. No rubs, gallops or murmurs. Lungs: clear Abdomen: soft, nontender, nondistended. No hepatosplenomegaly. No bruits or masses. Good bowel sounds. Extremities: no cyanosis, clubbing, rash,  edema Neuro: alert & orientedx3, cranial nerves grossly intact. moves all 4 extremities w/o difficulty. Affect pleasant   ASSESSMENT &  PLAN:  1. Left Breast Cancer - s/p chemo with AC/XRT completed 4/21 - echo today 06/27/21 EF 60-65%  2. Dyspnea - volume status looks good.  - echo today normal - Exam unremarkable - will check CXR - given coronary calcifications on CT with get ETT/Myoview   Glori Bickers, MD  3:20 PM

## 2021-06-27 NOTE — Progress Notes (Signed)
°  Echocardiogram 2D Echocardiogram has been performed.  Cindy [name] 06/27/2021, 2:30 PM

## 2021-06-27 NOTE — Patient Instructions (Signed)
Medication Changes:  No change  Lab Work:  none  Testing/Procedures:  How to Prepare for Your Myoview Test (stress test):  Nothing to eat or drink, except water, 4 hours prior to arrival time.  NO caffeine/decaffeinated products, or chocolate 12 hours prior to arrival. [place], please do not wear dresses.  Skirts or pants are approprate, please wear a short sleeve shirt. NO perfume, cologne or lotion Wear comfortable walking shoes.  NO HEELS! Total time is 3 to 4 hours; you may want to bring reading material for the waiting time. Please report to [facility] for your test  What to expect after you arrive:  Once you arrive and check in for your appointment an IV will be started in your arm.  Then the Technoligist will inject a small amount of radioactive tracer.  There will be a 1 hour waiting period after this injection.  A series of pictures will be taken of your heart following this waiting period.  You will be prepped for the stress portion of the test.  During the stress portion of your test you will either walk on a treadmill or receive a small, safe amount of radioactive tracer injected in your IV.  After the stress portion, there is a short rest period during which time your heart and blood pressure will be monitored.  After the short rest period the Technologist will begin your second set of pictures.  Your doctor will inform you of your test results within 7-10 business days. Once approved by insurance you will be called to schedule an appointment In preparation for your appointment, medication and supplies will be purchased.  Appointment availability is limited, so if you need to cancel or reschedule please call the office at [phone] 24 hours in advance to avoid a cancellation fee of $100.00  IF [place], [place] TECHNOLOGIST.   Referrals:  none  Special Instructions // Education:  none  Follow-Up in: 6 months (July 2023) ** Call  office in June for appointment  At the [facility], you and your health needs are our priority. We have a designated team specialized in the treatment of Heart Failure. This Care Team includes your primary Heart Failure Specialized Cardiologist (physician), Advanced Practice Providers (APPs- Physician Assistants and Nurse Practitioners), and Pharmacist who all work together to provide you with the care you need, when you need it.   You may see any of the following providers on your designated Care Team at your next follow up:  Dr Glori Bickers Dr Haynes Kerns, NP Lyda Jester, [facility] [place], [place] Audry Riles, PharmD   Please be sure to bring in all your medications bottles to every appointment.   Need to Contact us:  If you have any questions or concerns before your next appointment please send [place] a message through [place] or call our office at [phone].    TO LEAVE A MESSAGE FOR THE NURSE SELECT OPTION 2, PLEASE LEAVE A MESSAGE INCLUDING: YOUR NAME DATE OF BIRTH CALL BACK NUMBER REASON FOR CALL**this is important as we prioritize the call backs  YOU WILL RECEIVE A CALL BACK THE SAME DAY AS LONG AS YOU CALL BEFORE 4:00 PM

## 2021-06-28 ENCOUNTER — Other Ambulatory Visit (HOSPITAL_COMMUNITY): Payer: Self-pay

## 2021-06-28 DIAGNOSIS — R0600 Dyspnea, unspecified: Secondary | ICD-10-CM

## 2021-07-05 ENCOUNTER — Other Ambulatory Visit (HOSPITAL_COMMUNITY): Payer: Self-pay

## 2021-07-05 NOTE — Addendum Note (Signed)
Encounter addended by: Jolaine Artist, MD on: 07/05/2021 2:43 PM  Actions taken: Visit diagnoses modified, Order list changed, Diagnosis association updated, Pharmacy for encounter modified

## 2021-07-05 NOTE — Addendum Note (Signed)
Encounter addended by: Jerl Mina, RN on: 07/05/2021 1:40 PM  Actions taken: Order list changed

## 2021-07-17 ENCOUNTER — Encounter (HOSPITAL_COMMUNITY): Payer: Self-pay | Admitting: Internal Medicine

## 2021-07-17 ENCOUNTER — Telehealth (HOSPITAL_COMMUNITY): Payer: Self-pay | Admitting: Internal Medicine

## 2021-07-17 NOTE — Telephone Encounter (Signed)
Patient returned my call to schedule Myoview that was ordered by MD. She left a Voicemail stating she does not wish to have the stress test. Order will be removed from the [facility]. Thank you.

## 2021-07-24 ENCOUNTER — Telehealth: Payer: Self-pay | Admitting: Hematology

## 2021-07-24 NOTE — Telephone Encounter (Signed)
Sch per 2/11 inbasket,prov req to r/s ; pt req to move appt out a few weeks, pt aware

## 2021-08-04 ENCOUNTER — Ambulatory Visit: Payer: PPO | Admitting: Hematology

## 2021-08-04 ENCOUNTER — Other Ambulatory Visit: Payer: PPO

## 2021-08-07 ENCOUNTER — Telehealth: Payer: Self-pay | Admitting: Family Medicine

## 2021-08-07 DIAGNOSIS — Z0000 Encounter for general adult medical examination without abnormal findings: Secondary | ICD-10-CM

## 2021-08-07 NOTE — Telephone Encounter (Signed)
Orders placed and pt scheduled for lab appointment on 09/29/21.

## 2021-08-07 NOTE — Telephone Encounter (Signed)
Patient would like to come in a week before her physical in order to do lab work. She states she has always done it and her insurance lets her do it. She would like orders placed so she can schedule an appointment for labs. Please advise.

## 2021-08-16 ENCOUNTER — Other Ambulatory Visit: Payer: Self-pay | Admitting: Family Medicine

## 2021-08-25 ENCOUNTER — Inpatient Hospital Stay: Payer: PPO | Attending: Hematology

## 2021-08-25 ENCOUNTER — Inpatient Hospital Stay: Payer: PPO | Admitting: Hematology

## 2021-08-25 ENCOUNTER — Other Ambulatory Visit: Payer: Self-pay | Admitting: *Deleted

## 2021-08-25 ENCOUNTER — Other Ambulatory Visit: Payer: Self-pay

## 2021-08-25 VITALS — BP 123/73 | HR 78 | Temp 98.4°F | Resp 17 | Wt 157.3 lb

## 2021-08-25 DIAGNOSIS — Z171 Estrogen receptor negative status [ER-]: Secondary | ICD-10-CM | POA: Diagnosis not present

## 2021-08-25 DIAGNOSIS — C50212 Malignant neoplasm of upper-inner quadrant of left female breast: Secondary | ICD-10-CM | POA: Insufficient documentation

## 2021-08-25 LAB — CBC WITH DIFFERENTIAL (CANCER CENTER ONLY)
Abs Immature Granulocytes: 0.01 10*3/uL (ref 0.00–0.07)
Basophils Absolute: 0 10*3/uL (ref 0.0–0.1)
Basophils Relative: 0 %
Eosinophils Absolute: 0.1 10*3/uL (ref 0.0–0.5)
Eosinophils Relative: 2 %
HCT: 39.4 % (ref 36.0–46.0)
Hemoglobin: 13.5 g/dL (ref 12.0–15.0)
Immature Granulocytes: 0 %
Lymphocytes Relative: 37 %
Lymphs Abs: 1.9 10*3/uL (ref 0.7–4.0)
MCH: 32.2 pg (ref 26.0–34.0)
MCHC: 34.3 g/dL (ref 30.0–36.0)
MCV: 94 fL (ref 80.0–100.0)
Monocytes Absolute: 0.3 10*3/uL (ref 0.1–1.0)
Monocytes Relative: 7 %
Neutro Abs: 2.8 10*3/uL (ref 1.7–7.7)
Neutrophils Relative %: 54 %
Platelet Count: 209 10*3/uL (ref 150–400)
RBC: 4.19 MIL/uL (ref 3.87–5.11)
RDW: 12.5 % (ref 11.5–15.5)
WBC Count: 5.2 10*3/uL (ref 4.0–10.5)
nRBC: 0 % (ref 0.0–0.2)

## 2021-08-25 LAB — CMP (CANCER CENTER ONLY)
ALT: 13 U/L (ref 0–44)
AST: 15 U/L (ref 15–41)
Albumin: 4.3 g/dL (ref 3.5–5.0)
Alkaline Phosphatase: 58 U/L (ref 38–126)
Anion gap: 6 (ref 5–15)
BUN: 23 mg/dL (ref 8–23)
CO2: 30 mmol/L (ref 22–32)
Calcium: 9.9 mg/dL (ref 8.9–10.3)
Chloride: 103 mmol/L (ref 98–111)
Creatinine: 0.84 mg/dL (ref 0.44–1.00)
GFR, Estimated: >60 mL/min (ref >60)
Glucose, Bld: 105 mg/dL — ABNORMAL HIGH (ref 70–99)
Potassium: 4.4 mmol/L (ref 3.5–5.1)
Sodium: 139 mmol/L (ref 135–145)
Total Bilirubin: 0.4 mg/dL (ref 0.3–1.2)
Total Protein: 7.3 g/dL (ref 6.5–8.1)

## 2021-08-25 NOTE — Progress Notes (Signed)
?[facility]   ?[phone] [phone]   ?Clinic Follow up Note  ? ?Patient Care Team: ?Mosie Lukes, MD as PCP - General (Family Medicine) ?Bennetta Laos, MD (Inactive) (Obstetrics and Gynecology) ?Rockwell Germany, RN as Oncology Nurse Navigator ?Mauro Kaufmann, RN as Oncology Nurse Navigator ?Erroll Luna, MD as Consulting Physician (General Surgery) ?Truitt Merle, MD as Consulting Physician (Hematology) ?Gery Pray, MD as Consulting Physician (Radiation Oncology) ? ?Date of Service:  08/25/2021 ? ?CHIEF COMPLAINT: f/u of left breast cancer, triple negative ? ?CURRENT THERAPY:  ?Surveillance ? ?ASSESSMENT & PLAN:  ?Cindy [name] is a 71 y.o. post-menopausal female with  ? ?1. Malignant neoplasm of upper-inner quadrant of left breast, Stage IIA, p(T2N0M0), triple negative, Grade III ?-diagnosed in 02/2019, s/p left breast lumpectomy with SLNB on 03/10/19.  ?-S/p adjuvant AC-T from 04/08/19-09/14/19 followed by adjuvant radiation per Dr. Sondra Come which she completed in 10/2019.  ?-mammogram on 05/29/21 showed benign secretory calcifications. ?-she is clinically doing very well. Labs reviewed, WNL. Physical exam was unremarkable. There is no clinical concern for recurrence. ?-continue surveillance and healthy lifestyle practices ?  ?2.  Bone health ?-DEXA 09/12/20 showed lowest T score -0.9 in the AP spine, normal ?-Continue calcium, vitamin D, and weightbearing exercise ?  ? ?PLAN: ?-lab and f/u with NP Lacie in 6 months ? ? ?No problem-specific Assessment & Plan notes found for this encounter. ? ? ?SUMMARY OF ONCOLOGIC HISTORY: ?Oncology History Overview Note  ?Cancer Staging ?Malignant neoplasm of upper-inner quadrant of left breast in female, estrogen receptor negative ([place]) ?Staging form: Breast, AJCC 8th Edition ?- Clinical stage from 02/06/2019: Stage IB (cT1c, cN0, cM0, G2, ER-, PR-, HER2-) - Signed by Truitt Merle, MD on 02/17/2019 ?- Pathologic stage from 03/10/2019:  Stage IIA (pT2, pN0, cM0, G3, ER-, PR-, HER2-) - Signed by Truitt Merle, MD on 03/12/2019 ? ?  ?Malignant neoplasm of upper-inner quadrant of left breast in female, estrogen receptor negative ([place])  ?02/04/2019 Mammogram  ? Diagnostic mammogram 02/04/19  ?IMPRESSION: ?Highly suspicious 1.7 x1.5 x1.7cm at 10:00 position of UPPER INNER LEFT breast mass 10 cm from nipple corresponding ?to the screening study finding. Tissue sampling recommended. ?  ?No abnormal LEFT axillary lymph nodes. ?  ?02/06/2019 Cancer Staging  ? Staging form: Breast, AJCC 8th Edition ?- Clinical stage from 02/06/2019: Stage IB (cT1c, cN0, cM0, G2, ER-, PR-, HER2-) - Signed by Truitt Merle, MD on 02/17/2019 ? ?  ?02/06/2019 Initial Biopsy  ? Diagnosis 02/06/19 ?Breast, left, needle core biopsy, 10 o'clock, 10cm/n, ribbon clip ?- INVASIVE MAMMARY CARCINOMA. ?  ?02/06/2019 Receptors her2  ? Results: ?IMMUNOHISTOCHEMICAL AND MORPHOMETRIC ANALYSIS PERFORMED MANUALLY ?The tumor cells are NEGATIVE for Her2 (1+). ?Estrogen Receptor: 0%, NEGATIVE ?Progesterone Receptor: 0%, NEGATIVE ?Proliferation Marker Ki67: 40% ?  ?02/17/2019 Initial Diagnosis  ? Malignant neoplasm of upper-inner quadrant of left breast in female, estrogen receptor negative ([place]) ?  ?03/10/2019 Cancer Staging  ? Staging form: Breast, AJCC 8th Edition ?- Pathologic stage from 03/10/2019: Stage IIA (pT2, pN0, cM0, G3, ER-, PR-, HER2-) - Signed by Truitt Merle, MD on 03/12/2019 ? ?  ?03/10/2019 Surgery  ? LEFT BREAST LUMPECTOMY WITH RADIOACTIVE SEED AND LEFT SENTINEL LYMPH NODE MAPPING and PAC placement  ?by Dr. Brantley Stage  ?03/10/19 ?  ?03/10/2019 Pathology Results  ? DIAGNOSIS: 03/10/19 ? ?A. BREAST, LEFT, LUMPECTOMY:  ?- Invasive ductal carcinoma, grade 3, 2.5 cm  ?- Carcinoma is 0.3 cm from superior margin  ?- Medial resection edge is  0.1 cm from carcinoma (final medial margin is  ?represented by part 2 which is negative for carcinoma)  ?- Negative for lymphovascular or perineural invasion  ?- Biopsy site changes   ?- See oncology table  ? ?B. BREAST, LEFT MEDIAL MARGIN, EXCISION:  ?- Benign breast parenchyma, negative for carcinoma  ? ?C. LYMPH NODE, LEFT AXILLARY, SENTINEL BIOPSY:  ?- Lymph node, negative for carcinoma (0/1)  ?  ?04/06/2019 Imaging  ? Bone scan IMPRESSION: ?Mild focus of uptake is seen involving the anterior portion of left ?upper rib, but there is no corresponding abnormality seen on the CT ?scan of the same day. Potentially this may represent degenerative ?change or secondary to overlying postsurgical change. Attention on ?follow-up is recommended. ?  ?No definite scintigraphic evidence of osseous metastases. ?  ?04/06/2019 Imaging  ? CT CAP IMPRESSION: ?1. Surgical changes left breast with borderline enlarged left ?axillary lymph node. No other definite evidence for metastatic ?disease in the chest, abdomen, or pelvis. ?2. Tiny bilateral pulmonary nodules. Attention on follow-up ?recommended. ?3. Scattered small hypoattenuating lesions in the liver parenchyma. ?Some of these have attenuation higher than would be expected for ?simple fluid and may be cysts complicated by proteinaceous debris or ?hemorrhage. MRI without and with contrast recommended to further ?evaluate. ?4. Small to moderate hiatal hernia. ?5. Aortic Atherosclerosis (ICD10-I70.0). ?  ?04/08/2019 - 09/14/2019 Chemotherapy  ? chemo AC q2weeks for 4 cycles starting 04/08/19-05/20/19 followed by Taxol weekly for 12 weeks starting 06/09/19.  ? ?  ?09/16/2019 Imaging  ? CT CAP w contrast IMPRESSION: ?1. No evidence for metastatic disease in the chest, abdomen, or ?pelvis. ?2. No change in the tiny bilateral pulmonary nodules, likely benign. ?Attention on follow-up suggested. ?3. Stable appearance of multiple hypoattenuating liver lesions. None ?of these lesions show suspicious enhancement on today's study in ?there probably a combination of simple and complex cysts. There are ?several tiny liver lesions too small to characterize on today's ?study.  Continued attention on follow-up recommended. ?4. Small hiatal hernia. ?5.  Aortic Atherosclerois (ICD10-170.0) ?  ?10/07/2019 - 11/04/2019 Radiation Therapy  ? Adjuvant RT with Dr Sondra Come 10/07/19-11/04/19 ?  ?11/25/2019 Procedure  ? PAC removed by Dr Brantley Stage ?  ? ? ? ?INTERVAL HISTORY:  ?BELLANIE MATTHEW is here for a follow up of breast cancer. She was last seen by NP Lacie on 02/01/21. She presents to the clinic alone. ?She reports she is doing well overall with no new complaints or concerns. ?  ?All other systems were reviewed with the patient and are negative. ? ?MEDICAL HISTORY:  ?Past Medical History:  ?Diagnosis Date  ? Allergy   ? Depression   ? ? if bipolar works the best  ? Elevated fasting blood sugar   ? hx  ? H/O measles   ? Headache   ? Hiatal hernia   ? History of chicken pox   ? Hx of migraines   ? Microscopic hematuria   ? advised to see urology ? never went  ? Personal history of chemotherapy   ? Personal history of radiation therapy   ? Preventative health care 06/20/2011  ? 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 ? ?SURGICAL HISTORY: ?Past Surgical History:  ?Procedure Laterality Date  ? BREAST BIOPSY Left 01/2019  ? BREAST LUMPECTOMY Left 02/2019  ? BREAST LUMPECTOMY WITH RADIOACTIVE SEED AND SENTINEL LYMPH NODE BIOPSY Left 03/10/2019  ? Procedure: LEFT BREAST LUMPECTOMY WITH RADIOACTIVE SEED AND LEFT SENTINEL LYMPH NODE MAPPING;  Surgeon: Erroll Luna,  MD;  Location: [facility];  Service: General;  Laterality: Left;  ? endometrial polyps    ? 2 surgeries by Dr Cathlean Cower  ? EYE SURGERY  04/29/2009  ? Rt eye tighten up  ? PORT-A-CATH REMOVAL Right 11/25/2019  ? Procedure: PORT REMOVAL;  Surgeon: Erroll Luna, MD;  Location: [facility];  Service: General;  Laterality: Right;  ? PORTACATH PLACEMENT N/A 03/10/2019  ? Procedure: INSERTION PORT-A-CATH WITH ULTRASOUND;  Surgeon: Erroll Luna, MD;  Location: [facility];  Service: General;  Laterality: N/A;  ? steel plate in skull Left   ? from a crushed  skull  ? ? ?I have reviewed the social history and family history with the patient and they are unchanged from previous note. ? ?ALLERGIES:  is allergic to bromfed and venlafaxine. ? ?MEDICATIONS:  ?[place]re

## 2021-08-26 ENCOUNTER — Encounter: Payer: Self-pay | Admitting: Hematology

## 2021-08-28 NOTE — Addendum Note (Signed)
Addended by: Estella Husk on: 08/28/2021 03:03 PM ? ? Modules accepted: Orders ? ?

## 2021-09-29 ENCOUNTER — Other Ambulatory Visit (INDEPENDENT_AMBULATORY_CARE_PROVIDER_SITE_OTHER): Payer: PPO

## 2021-09-29 DIAGNOSIS — Z0000 Encounter for general adult medical examination without abnormal findings: Secondary | ICD-10-CM | POA: Diagnosis not present

## 2021-09-29 LAB — CBC WITH DIFFERENTIAL/PLATELET
Basophils Absolute: 0 10*3/uL (ref 0.0–0.1)
Basophils Relative: 0.4 % (ref 0.0–3.0)
Eosinophils Absolute: 0.1 10*3/uL (ref 0.0–0.7)
Eosinophils Relative: 1.4 % (ref 0.0–5.0)
HCT: 40.5 % (ref 36.0–46.0)
Hemoglobin: 13.7 g/dL (ref 12.0–15.0)
Lymphocytes Relative: 44.1 % (ref 12.0–46.0)
Lymphs Abs: 2.4 10*3/uL (ref 0.7–4.0)
MCHC: 33.9 g/dL (ref 30.0–36.0)
MCV: 95.6 fl (ref 78.0–100.0)
Monocytes Absolute: 0.4 10*3/uL (ref 0.1–1.0)
Monocytes Relative: 7.3 % (ref 3.0–12.0)
Neutro Abs: 2.6 10*3/uL (ref 1.4–7.7)
Neutrophils Relative %: 46.8 % (ref 43.0–77.0)
Platelets: 217 10*3/uL (ref 150.0–400.0)
RBC: 4.24 Mil/uL (ref 3.87–5.11)
RDW: 13.8 % (ref 11.5–15.5)
WBC: 5.5 10*3/uL (ref 4.0–10.5)

## 2021-09-29 LAB — COMPREHENSIVE METABOLIC PANEL
ALT: 11 U/L (ref 0–35)
AST: 13 U/L (ref 0–37)
Albumin: 4.2 g/dL (ref 3.5–5.2)
Alkaline Phosphatase: 62 U/L (ref 39–117)
BUN: 26 mg/dL — ABNORMAL HIGH (ref 6–23)
CO2: 29 mEq/L (ref 19–32)
Calcium: 9.4 mg/dL (ref 8.4–10.5)
Chloride: 104 mEq/L (ref 96–112)
Creatinine, Ser: 0.77 mg/dL (ref 0.40–1.20)
GFR: 78.7 mL/min (ref >60.00)
Glucose, Bld: 112 mg/dL — ABNORMAL HIGH (ref 70–99)
Potassium: 5 mEq/L (ref 3.5–5.1)
Sodium: 140 mEq/L (ref 135–145)
Total Bilirubin: 0.4 mg/dL (ref 0.2–1.2)
Total Protein: 6.6 g/dL (ref 6.0–8.3)

## 2021-09-29 LAB — LIPID PANEL
Cholesterol: 188 mg/dL (ref 0–200)
HDL: 64.7 mg/dL (ref >39.00)
LDL Cholesterol: 109 mg/dL — ABNORMAL HIGH (ref 0–99)
NonHDL: 123.67
Total CHOL/HDL Ratio: 3
Triglycerides: 72 mg/dL (ref 0.0–149.0)
VLDL: 14.4 mg/dL (ref 0.0–40.0)

## 2021-09-29 NOTE — Progress Notes (Signed)
? ?Subjective:  ? ? Patient ID: Cindy [name], female    DOB: Dec 15, 1950, 71 y.o.   MRN: [id number] ? ?Chief Complaint  ?Patient presents with  ? Annual Exam  ? ? ?HPI ?Patient is in today for her annual physical exam and follow up on chronic medical concerns. No recent febrile illness or hospitalizations.  Overall she is doing well.  No recent febrile illness or hospitalizations.  She continues to follow with oncology every 6 months and is doing well in that regard.  She tries to eat well and stay active.  She does acknowledge eating a good amount of simple carbohydrates in her diet especially Pasta and rice. Denies CP/palp/SOB/HA/congestion/fevers/GI or GU c/o. Taking meds as prescribed  ? ?Past Medical History:  ?Diagnosis Date  ? Allergy   ? 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 Blood transfusion without reported diagnosis December 2020  ? Cancer [facility]) 2020  ? Chemo/radiation left breast  ? Depression   ? ? if bipolar works the best  ? Elevated fasting blood sugar   ? hx  ? GERD (gastroesophageal reflux disease)   ? Several years ago  ? H/O measles   ? Headache   ? Hiatal hernia   ? History of chicken pox   ? Hx of migraines   ? Microscopic hematuria   ? advised to see urology ? never went  ? Personal history of chemotherapy   ? Personal history of radiation therapy   ? Preventative health care 06/20/2011  ? 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 ? ?Past Surgical History:  ?Procedure Laterality Date  ? BREAST BIOPSY Left 01/2019  ? BREAST LUMPECTOMY Left 02/2019  ? BREAST LUMPECTOMY WITH RADIOACTIVE SEED AND SENTINEL LYMPH NODE BIOPSY Left 03/10/2019  ? Procedure: LEFT BREAST LUMPECTOMY WITH RADIOACTIVE SEED AND LEFT SENTINEL LYMPH NODE MAPPING;  Surgeon: Erroll Luna, MD;  Location: [place];  Service: General;  Laterality: Left;  ? endometrial polyps    ? 2 surgeries by Dr Cathlean Cower  ? EYE SURGERY  04/29/2009  ? Rt eye tighten up  ? PORT-A-CATH REMOVAL Right 11/25/2019  ? Procedure: PORT REMOVAL;  Surgeon: Erroll Luna, MD;  Location: [place];  Service: General;  Laterality: Right;  ? PORTACATH PLACEMENT N/A 03/10/2019  ? Procedure: INSERTION PORT-A-CATH WITH ULTRASOUND;  Surgeon: Erroll Luna, MD;  Location: [place];  Service: General;  Laterality: N/A;  ? steel plate in skull Left   ? from a crushed skull  ? ? ?Family History  ?Problem Relation Age of Onset  ? Alzheimer's disease Mother   ? Cancer Mother   ?     colon  ? Thyroid disease Father   ? COPD Father   ? Cancer Father   ?     bladder with cystectomy  ? Cancer Maternal Grandfather   ?     colon cancer  ? Cancer Other   ?     maternal grant granmother had breast cancer   ? ? ?Social History  ? ?Socioeconomic History  ? Marital status: Single  ?  Spouse name: Not on file  ? Number of children: Not on file  ? Years of education: Not on file  ? Highest education level: Not on file  ?Occupational History  ? Occupation: retired   ?Tobacco Use  ? Smoking status: Former  ?  Packs/day: 0.25  ?  Types: Cigarettes  ?  Quit date: 11/23/1981  ?  Years since  quitting: 39.8  ? Smokeless tobacco: Never  ? Tobacco comments:  ?  social smoker in her 20-30's   ?Vaping Use  ? Vaping Use: Never used  ?Substance and Sexual Activity  ? Alcohol use: Yes  ?  Alcohol/week: 1.0 - 2.0 standard drink  ?  Types: 1 - 2 Glasses of wine per week  ?  Comment: very rare on social occasions  ? Drug use: No  ? Sexual activity: Not Currently  ?  Birth control/protection: None  ?Other Topics Concern  ? Not on file  ?Social History Narrative  ? HH of 1  ? 2 Cats  ? Caffeine 2 mugs  ? Exercise recently some, no major dietary restrictions at this time. Was vegan now eats chicken and poultry  ? 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 ?Social Determinants of Health  ? ?Financial Resource Strain: Not on file  ?Food Insecurity: Not on file  ?Transportation Needs: Not on file  ?Physical Activity: Not on file  ?Stress: Not on file  ?Social Connections: Not on file  ?Intimate Partner Violence: Not on file   ? ? ?Outpatient Medications Prior to Visit  ?Medication Sig Dispense Refill  ? Ascorbic Acid (VITAMIN C PO) Take by mouth.    ? BIOTIN PO Take by mouth.    ? cholecalciferol (VITAMIN D3) 25 MCG (1000 UNIT) tablet Take 1,000 Units by mouth daily.    ? KRILL OIL PO Take by mouth.    ? Magnesium 300 MG CAPS Take 300 mg by mouth daily.    ? Menaquinone-7 (VITAMIN K2 PO) Take by mouth.    ? Multiple Vitamin (MULTIVITAMIN WITH MINERALS) TABS tablet Take 1 tablet by mouth daily.    ? VITAMIN E PO Take by mouth.    ? EVENING PRIMROSE OIL PO Take 1,300 mg by mouth 2 (two) times daily.    ? ?No facility-administered medications prior to visit.  ? ? ?Allergies  ?Allergen Reactions  ? Bromfed Shortness Of Breath  ? Venlafaxine   ?  Doesn't remember reaction  ? ? ?Review of Systems  ?Constitutional:  Negative for chills, fever and malaise/fatigue.  ?HENT:  Negative for congestion and hearing loss.   ?Eyes:  Negative for discharge.  ?Respiratory:  Negative for cough, sputum production and shortness of breath.   ?Cardiovascular:  Negative for chest pain, palpitations and leg swelling.  ?Gastrointestinal:  Negative for abdominal pain, blood in stool, constipation, diarrhea, heartburn, nausea and vomiting.  ?Genitourinary:  Negative for dysuria, frequency, hematuria and urgency.  ?Musculoskeletal:  Negative for back pain, falls and myalgias.  ?Skin:  Negative for rash.  ?Neurological:  Negative for dizziness, sensory change, loss of consciousness, weakness and headaches.  ?Endo/Heme/Allergies:  Negative for environmental allergies. Does not bruise/bleed easily.  ?Psychiatric/Behavioral:  Negative for depression and suicidal ideas. The patient is not nervous/anxious and does not have insomnia.   ? ?   ?Objective:  ?  ?Physical Exam ?Constitutional:   ?   General: She is not in acute distress. ?   Appearance: She is well-developed.  ?HENT:  ?   Head: Normocephalic and atraumatic.  ?Eyes:  ?   Conjunctiva/sclera: Conjunctivae normal.   ?Neck:  ?   Thyroid: No thyromegaly.  ?Cardiovascular:  ?   Rate and Rhythm: Normal rate and regular rhythm.  ?   Heart sounds: Normal heart sounds. No murmur heard. ?Pulmonary:  ?   Effort: Pulmonary effort is normal. No respiratory distress.  ?   Breath sounds: Normal  breath sounds.  ?Abdominal:  ?   General: Bowel sounds are normal. There is no distension.  ?   Palpations: Abdomen is soft. There is no mass.  ?   Tenderness: There is no abdominal tenderness.  ?Musculoskeletal:  ?   Cervical back: Neck supple.  ?Lymphadenopathy:  ?   Cervical: No cervical adenopathy.  ?Skin: ?   General: Skin is warm and dry.  ?Neurological:  ?   Mental Status: She is alert and oriented to person, place, and time.  ?Psychiatric:     ?   Behavior: Behavior normal.  ? ? ?BP 118/70 (BP Location: Left Arm, Patient Position: Sitting, Cuff Size: Normal)   Pulse 70   Resp 20   Ht '5\' 4"'$  (1.626 m)   Wt 156 lb 3.2 oz (70.9 kg)   SpO2 99%   BMI 26.81 kg/m?  ?Wt Readings from Last 3 Encounters:  ?10/03/21 156 lb 3.2 oz (70.9 kg)  ?08/25/21 157 lb 5 oz (71.4 kg)  ?06/27/21 158 lb (71.7 kg)  ? ? ?Diabetic Foot Exam - Simple   ?No data filed ?  ? ?Lab Results  ?Component Value Date  ? WBC 5.5 09/29/2021  ? HGB 13.7 09/29/2021  ? HCT 40.5 09/29/2021  ? PLT 217.0 09/29/2021  ? GLUCOSE 112 (H) 09/29/2021  ? CHOL 188 09/29/2021  ? TRIG 72.0 09/29/2021  ? HDL 64.70 09/29/2021  ? LDLDIRECT 123.1 06/25/2011  ? LDLCALC 109 (H) 09/29/2021  ? ALT 11 09/29/2021  ? AST 13 09/29/2021  ? NA 140 09/29/2021  ? K 5.0 09/29/2021  ? CL 104 09/29/2021  ? CREATININE 0.77 09/29/2021  ? BUN 26 (H) 09/29/2021  ? CO2 29 09/29/2021  ? TSH 1.07 03/31/2021  ? HGBA1C 5.8 03/31/2021  ? ? ?Lab Results  ?Component Value Date  ? TSH 1.07 03/31/2021  ? ?Lab Results  ?Component Value Date  ? WBC 5.5 09/29/2021  ? HGB 13.7 09/29/2021  ? HCT 40.5 09/29/2021  ? MCV 95.6 09/29/2021  ? PLT 217.0 09/29/2021  ? ?Lab Results  ?Component Value Date  ? NA 140 09/29/2021  ? K 5.0  09/29/2021  ? CO2 29 09/29/2021  ? GLUCOSE 112 (H) 09/29/2021  ? BUN 26 (H) 09/29/2021  ? CREATININE 0.77 09/29/2021  ? BILITOT 0.4 09/29/2021  ? ALKPHOS 62 09/29/2021  ? AST 13 09/29/2021  ? ALT 11 09/29/2021  ? P

## 2021-10-03 ENCOUNTER — Other Ambulatory Visit (HOSPITAL_BASED_OUTPATIENT_CLINIC_OR_DEPARTMENT_OTHER): Payer: Self-pay

## 2021-10-03 ENCOUNTER — Encounter: Payer: Self-pay | Admitting: Hematology

## 2021-10-03 ENCOUNTER — Encounter: Payer: Self-pay | Admitting: Family Medicine

## 2021-10-03 ENCOUNTER — Ambulatory Visit (INDEPENDENT_AMBULATORY_CARE_PROVIDER_SITE_OTHER): Payer: PPO | Admitting: Family Medicine

## 2021-10-03 VITALS — BP 118/70 | HR 70 | Resp 20 | Ht 64.0 in | Wt 156.2 lb

## 2021-10-03 DIAGNOSIS — Z0000 Encounter for general adult medical examination without abnormal findings: Secondary | ICD-10-CM | POA: Diagnosis not present

## 2021-10-03 DIAGNOSIS — E785 Hyperlipidemia, unspecified: Secondary | ICD-10-CM | POA: Diagnosis not present

## 2021-10-03 DIAGNOSIS — G43909 Migraine, unspecified, not intractable, without status migrainosus: Secondary | ICD-10-CM

## 2021-10-03 DIAGNOSIS — R7309 Other abnormal glucose: Secondary | ICD-10-CM | POA: Diagnosis not present

## 2021-10-03 MED ORDER — SHINGRIX 50 MCG/0.5ML IM SUSR
INTRAMUSCULAR | 1 refills | Status: DC
  Filled 2021-10-03: qty 1, 1d supply, fill #0

## 2021-10-03 NOTE — Assessment & Plan Note (Signed)
hgba1c acceptable, minimize simple carbs. Increase exercise as tolerated.  

## 2021-10-03 NOTE — Assessment & Plan Note (Signed)
Patient encouraged to maintain heart healthy diet, regular exercise, adequate sleep. Consider daily probiotics. Take medications as prescribed. Labs ordered and reviewed. MGM annually with oncology. Had iFOB August 2022 and declines colonoscopy due to prep, she will consider repeating one. Last Dexa normal in April 2022, repeat in 3-4 years. Pap 2020, repeat next year. Shingrix is the new shingles shot, 2 shots over 2-6 months, confirm coverage with insurance and document, then can return here for shots with nurse appt or at pharmacy ?

## 2021-10-03 NOTE — Assessment & Plan Note (Signed)
Encourage heart healthy diet such as MIND or DASH diet, increase exercise, avoid trans fats, simple carbohydrates and processed foods, consider a krill or fish or flaxseed oil cap daily.  °

## 2021-10-03 NOTE — Assessment & Plan Note (Signed)
Encouraged increased hydration, 64 ounces of clear fluids daily. Minimize alcohol and caffeine. Eat small frequent meals with lean proteins and complex carbs. Avoid high and low blood sugars. Get adequate sleep, 7-8 hours a night. Needs exercise daily preferably in the morning.  

## 2021-10-03 NOTE — Patient Instructions (Addendum)
Shingrix is the new shingles shot, 2 shots over 2-6 months, confirm coverage with insurance and document, then can return here for shots with nurse appt or at pharmacy   Preventive Care 65 Years and Older, Female Preventive care refers to lifestyle choices and visits with your health care provider that can promote health and wellness. Preventive care visits are also called wellness exams. What can I expect for my preventive care visit? Counseling Your health care provider may ask you questions about your: Medical history, including: Past medical problems. Family medical history. Pregnancy and menstrual history. History of falls. Current health, including: Memory and ability to understand (cognition). Emotional well-being. Home life and relationship well-being. Sexual activity and sexual health. Lifestyle, including: Alcohol, nicotine or tobacco, and drug use. Access to firearms. Diet, exercise, and sleep habits. Work and work environment. Sunscreen use. Safety issues such as seatbelt and bike helmet use. Physical exam Your health care provider will check your: Height and weight. These may be used to calculate your BMI (body mass index). BMI is a measurement that tells if you are at a healthy weight. Waist circumference. This measures the distance around your waistline. This measurement also tells if you are at a healthy weight and may help predict your risk of certain diseases, such as type 2 diabetes and high blood pressure. Heart rate and blood pressure. Body temperature. Skin for abnormal spots. What immunizations do I need?  Vaccines are usually given at various ages, according to a schedule. Your health care provider will recommend vaccines for you based on your age, medical history, and lifestyle or other factors, such as travel or where you work. What tests do I need? Screening Your health care provider may recommend screening tests for certain conditions. This may  include: Lipid and cholesterol levels. Hepatitis C test. Hepatitis B test. HIV (human immunodeficiency virus) test. STI (sexually transmitted infection) testing, if you are at risk. Lung cancer screening. Colorectal cancer screening. Diabetes screening. This is done by checking your blood sugar (glucose) after you have not eaten for a while (fasting). Mammogram. Talk with your health care provider about how often you should have regular mammograms. BRCA-related cancer screening. This may be done if you have a family history of breast, ovarian, tubal, or peritoneal cancers. Bone density scan. This is done to screen for osteoporosis. Talk with your health care provider about your test results, treatment options, and if necessary, the need for more tests. Follow these instructions at home: Eating and drinking  Eat a diet that includes fresh fruits and vegetables, whole grains, lean protein, and low-fat dairy products. Limit your intake of foods with high amounts of sugar, saturated fats, and salt. Take vitamin and mineral supplements as recommended by your health care provider. Do not drink alcohol if your health care provider tells you not to drink. If you drink alcohol: Limit how much you have to 0-1 drink a day. Know how much alcohol is in your drink. In the [place] one drink equals one 12 oz bottle of beer (355 mL), one 5 oz glass of wine (148 mL), or one 1 oz glass of hard liquor (44 mL). Lifestyle Brush your teeth every morning and night with fluoride toothpaste. Floss one time each day. Exercise for at least 30 minutes 5 or more days each week. Do not use any products that contain nicotine or tobacco. These products include cigarettes, chewing tobacco, and vaping devices, such as e-cigarettes. If you need help quitting, ask your health care provider.   Do not use drugs. If you are sexually active, practice safe sex. Use a condom or other form of protection in order to prevent STIs. Take  aspirin only as told by your health care provider. Make sure that you understand how much to take and what form to take. Work with your health care provider to find out whether it is safe and beneficial for you to take aspirin daily. Ask your health care provider if you need to take a cholesterol-lowering medicine (statin). Find healthy ways to manage stress, such as: Meditation, yoga, or listening to music. Journaling. Talking to a trusted person. Spending time with friends and family. Minimize exposure to UV radiation to reduce your risk of skin cancer. Safety Always wear your seat belt while driving or riding in a vehicle. Do not drive: If you have been drinking alcohol. Do not ride with someone who has been drinking. When you are tired or distracted. While texting. If you have been using any mind-altering substances or drugs. Wear a helmet and other protective equipment during sports activities. If you have firearms in your house, make sure you follow all gun safety procedures. What's next? Visit your health care provider once a year for an annual wellness visit. Ask your health care provider how often you should have your eyes and teeth checked. Stay up to date on all vaccines. This information is not intended to replace advice given to you by your health care provider. Make sure you discuss any questions you have with your health care provider. Document Revised: 11/23/2020 Document Reviewed: 11/23/2020 Elsevier Patient Education  2023 [employer]  

## 2021-10-04 ENCOUNTER — Other Ambulatory Visit: Payer: Self-pay | Admitting: Hematology

## 2021-10-04 DIAGNOSIS — Z1231 Encounter for screening mammogram for malignant neoplasm of breast: Secondary | ICD-10-CM

## 2021-10-04 LAB — TSH: TSH: 1.41 u[IU]/mL (ref 0.35–5.50)

## 2021-10-04 LAB — HEMOGLOBIN A1C: Hgb A1c MFr Bld: 5.8 % (ref 4.6–6.5)

## 2021-12-05 ENCOUNTER — Other Ambulatory Visit: Payer: Self-pay | Admitting: Family Medicine

## 2021-12-06 ENCOUNTER — Telehealth: Payer: Self-pay

## 2021-12-06 NOTE — Telephone Encounter (Signed)
Lvm to see if pt is still taking medication above

## 2022-01-01 ENCOUNTER — Other Ambulatory Visit: Payer: Self-pay

## 2022-02-08 ENCOUNTER — Ambulatory Visit: Payer: Self-pay

## 2022-02-08 NOTE — Patient Outreach (Signed)
  Care Coordination   02/08/2022 Name: Cindy [name] MRN: [id number] DOB: February 19, 1951   Care Coordination Outreach Attempts:  An unsuccessful telephone outreach was attempted today to offer the patient information about available care coordination services as a benefit of their health plan.   Follow Up Plan:  Additional outreach attempts will be made to offer the patient care coordination information and services.   Encounter Outcome:  No Answer  Care Coordination Interventions Activated:  No   Care Coordination Interventions:  No, not indicated    Thea Silversmith, RN, MSN, BSN, CCM Care Coordinator [phone]

## 2022-02-27 ENCOUNTER — Telehealth: Payer: Self-pay

## 2022-02-27 NOTE — Patient Outreach (Signed)
  Care Coordination   02/27/2022 Name: Cindy [name] MRN: [id number] DOB: 02-01-1951   Care Coordination Outreach Attempts:  A second unsuccessful outreach was attempted today to offer the patient with information about available care coordination services as a benefit of their health plan.     Follow Up Plan:  Additional outreach attempts will be made to offer the patient care coordination information and services.   Encounter Outcome:  No Answer  Care Coordination Interventions Activated:  No   Care Coordination Interventions:  No, not indicated    Thea Silversmith, RN, MSN, BSN, [place] Coordinator [phone]

## 2022-02-28 ENCOUNTER — Inpatient Hospital Stay: Payer: PPO | Attending: Hematology

## 2022-02-28 ENCOUNTER — Inpatient Hospital Stay: Payer: PPO | Admitting: Hematology

## 2022-02-28 ENCOUNTER — Encounter: Payer: Self-pay | Admitting: Hematology

## 2022-02-28 ENCOUNTER — Other Ambulatory Visit: Payer: Self-pay

## 2022-02-28 VITALS — BP 118/72 | HR 85 | Temp 98.3°F | Resp 16 | Wt 154.0 lb

## 2022-02-28 DIAGNOSIS — Z853 Personal history of malignant neoplasm of breast: Secondary | ICD-10-CM | POA: Insufficient documentation

## 2022-02-28 DIAGNOSIS — Z171 Estrogen receptor negative status [ER-]: Secondary | ICD-10-CM | POA: Diagnosis not present

## 2022-02-28 DIAGNOSIS — Z923 Personal history of irradiation: Secondary | ICD-10-CM | POA: Insufficient documentation

## 2022-02-28 DIAGNOSIS — C50212 Malignant neoplasm of upper-inner quadrant of left female breast: Secondary | ICD-10-CM

## 2022-02-28 LAB — CBC WITH DIFFERENTIAL (CANCER CENTER ONLY)
Abs Immature Granulocytes: 0.01 10*3/uL (ref 0.00–0.07)
Basophils Absolute: 0 10*3/uL (ref 0.0–0.1)
Basophils Relative: 0 %
Eosinophils Absolute: 0.1 10*3/uL (ref 0.0–0.5)
Eosinophils Relative: 1 %
HCT: 39.7 % (ref 36.0–46.0)
Hemoglobin: 13.6 g/dL (ref 12.0–15.0)
Immature Granulocytes: 0 %
Lymphocytes Relative: 37 %
Lymphs Abs: 2.1 10*3/uL (ref 0.7–4.0)
MCH: 32.2 pg (ref 26.0–34.0)
MCHC: 34.3 g/dL (ref 30.0–36.0)
MCV: 94.1 fL (ref 80.0–100.0)
Monocytes Absolute: 0.4 10*3/uL (ref 0.1–1.0)
Monocytes Relative: 7 %
Neutro Abs: 3.1 10*3/uL (ref 1.7–7.7)
Neutrophils Relative %: 55 %
Platelet Count: 219 10*3/uL (ref 150–400)
RBC: 4.22 MIL/uL (ref 3.87–5.11)
RDW: 13.2 % (ref 11.5–15.5)
WBC Count: 5.7 10*3/uL (ref 4.0–10.5)
nRBC: 0 % (ref 0.0–0.2)

## 2022-02-28 LAB — CMP (CANCER CENTER ONLY)
ALT: 17 U/L (ref 0–44)
AST: 19 U/L (ref 15–41)
Albumin: 4.1 g/dL (ref 3.5–5.0)
Alkaline Phosphatase: 61 U/L (ref 38–126)
Anion gap: 8 (ref 5–15)
BUN: 18 mg/dL (ref 8–23)
CO2: 26 mmol/L (ref 22–32)
Calcium: 9.7 mg/dL (ref 8.9–10.3)
Chloride: 106 mmol/L (ref 98–111)
Creatinine: 0.79 mg/dL (ref 0.44–1.00)
GFR, Estimated: >60 mL/min (ref >60)
Glucose, Bld: 111 mg/dL — ABNORMAL HIGH (ref 70–99)
Potassium: 4.8 mmol/L (ref 3.5–5.1)
Sodium: 140 mmol/L (ref 135–145)
Total Bilirubin: 0.4 mg/dL (ref 0.3–1.2)
Total Protein: 7.2 g/dL (ref 6.5–8.1)

## 2022-02-28 NOTE — Progress Notes (Signed)
[place]   Telephone:([phone]) [phone] Fax:(336) [phone]   Clinic Follow up Note   Patient Care Team: Mosie Lukes, MD as PCP - General (Family Medicine) Cherylann Banas, Cherly Anderson, MD (Inactive) (Obstetrics and Gynecology) Rockwell Germany, RN as Oncology Nurse Navigator Mauro Kaufmann, RN as Oncology Nurse Navigator Erroll Luna, MD as Consulting Physician (General Surgery) Truitt Merle, MD as Consulting Physician (Hematology) Gery Pray, MD as Consulting Physician (Radiation Oncology)  Date of Service:  02/28/2022  CHIEF COMPLAINT: f/u of left breast cancer  CURRENT THERAPY:  Surveillance  ASSESSMENT & PLAN:  Cindy [name] is a 71 y.o. post-menopausal female with   1. Malignant neoplasm of upper-inner quadrant of left breast, ductal carcinoma, Stage IIA, p(T2N0M0), triple negative, Grade III -diagnosed in 02/2019, s/p left breast lumpectomy with SLNB on 03/10/19.  -S/p adjuvant AC-T from 04/08/19 - 09/14/19 and adjuvant radiation per Dr. Sondra Come 10/07/19 - 11/04/19. -mammogram on 05/29/21 showed benign secretory calcifications. Repeat is scheduled for 05/30/22. -she is clinically doing very well. Labs reviewed, WNL. Physical exam was unremarkable. There is no clinical concern for recurrence. -continue surveillance and healthy lifestyle practices   2.  Bone health -DEXA 09/12/20 showed lowest T score -0.9 in the AP spine, normal -Continue calcium, vitamin D, and weightbearing exercise     PLAN: -mammogram 05/30/22 -lab and f/u in 1 year   No problem-specific Assessment & Plan notes found for this encounter.   SUMMARY OF ONCOLOGIC HISTORY: Oncology History Overview Note  Cancer Staging Malignant neoplasm of upper-inner quadrant of left breast in female, estrogen receptor negative ([place]) Staging form: Breast, AJCC 8th Edition - Clinical stage from 02/06/2019: Stage IB (cT1c, cN0, cM0, G2, ER-, PR-, HER2-) - Signed by Truitt Merle, MD on 02/17/2019 - Pathologic  stage from 03/10/2019: Stage IIA (pT2, pN0, cM0, G3, ER-, PR-, HER2-) - Signed by Truitt Merle, MD on 03/12/2019    Malignant neoplasm of upper-inner quadrant of left breast in female, estrogen receptor negative ([place])  02/04/2019 Mammogram   Diagnostic mammogram 02/04/19  IMPRESSION: Highly suspicious 1.7 x1.5 x1.7cm at 10:00 position of UPPER INNER LEFT breast mass 10 cm from nipple corresponding to the screening study finding. Tissue sampling recommended.   No abnormal LEFT axillary lymph nodes.   02/06/2019 Cancer Staging   Staging form: Breast, AJCC 8th Edition - Clinical stage from 02/06/2019: Stage IB (cT1c, cN0, cM0, G2, ER-, PR-, HER2-) - Signed by Truitt Merle, MD on 02/17/2019   02/06/2019 Initial Biopsy   Diagnosis 02/06/19 Breast, left, needle core biopsy, 10 o'clock, 10cm/n, ribbon clip - INVASIVE MAMMARY CARCINOMA.   02/06/2019 Receptors her2   Results: IMMUNOHISTOCHEMICAL AND MORPHOMETRIC ANALYSIS PERFORMED MANUALLY The tumor cells are NEGATIVE for Her2 (1+). Estrogen Receptor: 0%, NEGATIVE Progesterone Receptor: 0%, NEGATIVE Proliferation Marker Ki67: 40%   02/17/2019 Initial Diagnosis   Malignant neoplasm of upper-inner quadrant of left breast in female, estrogen receptor negative ([place])   03/10/2019 Cancer Staging   Staging form: Breast, AJCC 8th Edition - Pathologic stage from 03/10/2019: Stage IIA (pT2, pN0, cM0, G3, ER-, PR-, HER2-) - Signed by Truitt Merle, MD on 03/12/2019   03/10/2019 Surgery   LEFT BREAST LUMPECTOMY WITH RADIOACTIVE SEED AND LEFT SENTINEL LYMPH NODE MAPPING and PAC placement  by Dr. Brantley Stage  03/10/19   03/10/2019 Pathology Results   DIAGNOSIS: 03/10/19  A. BREAST, LEFT, LUMPECTOMY:  - Invasive ductal carcinoma, grade 3, 2.5 cm  - Carcinoma is 0.3 cm from superior margin  - Medial resection edge  is 0.1 cm from carcinoma (final medial margin is  represented by part 2 which is negative for carcinoma)  - Negative for lymphovascular or perineural invasion  -  Biopsy site changes  - See oncology table   B. BREAST, LEFT MEDIAL MARGIN, EXCISION:  - Benign breast parenchyma, negative for carcinoma   C. LYMPH NODE, LEFT AXILLARY, SENTINEL BIOPSY:  - Lymph node, negative for carcinoma (0/1)    04/06/2019 Imaging   Bone scan IMPRESSION: Mild focus of uptake is seen involving the anterior portion of left upper rib, but there is no corresponding abnormality seen on the CT scan of the same day. Potentially this may represent degenerative change or secondary to overlying postsurgical change. Attention on follow-up is recommended.   No definite scintigraphic evidence of osseous metastases.   04/06/2019 Imaging   CT CAP IMPRESSION: 1. Surgical changes left breast with borderline enlarged left axillary lymph node. No other definite evidence for metastatic disease in the chest, abdomen, or pelvis. 2. Tiny bilateral pulmonary nodules. Attention on follow-up recommended. 3. Scattered small hypoattenuating lesions in the liver parenchyma. Some of these have attenuation higher than would be expected for simple fluid and may be cysts complicated by proteinaceous debris or hemorrhage. MRI without and with contrast recommended to further evaluate. 4. Small to moderate hiatal hernia. 5. Aortic Atherosclerosis (ICD10-I70.0).   04/08/2019 - 09/14/2019 Chemotherapy   chemo AC q2weeks for 4 cycles starting 04/08/19-05/20/19 followed by Taxol weekly for 12 weeks starting 06/09/19.     09/16/2019 Imaging   CT CAP w contrast IMPRESSION: 1. No evidence for metastatic disease in the chest, abdomen, or pelvis. 2. No change in the tiny bilateral pulmonary nodules, likely benign. Attention on follow-up suggested. 3. Stable appearance of multiple hypoattenuating liver lesions. None of these lesions show suspicious enhancement on today's study in there probably a combination of simple and complex cysts. There are several tiny liver lesions too small to characterize  on today's study. Continued attention on follow-up recommended. 4. Small hiatal hernia. 5.  Aortic Atherosclerois (ICD10-170.0)   10/07/2019 - 11/04/2019 Radiation Therapy   Adjuvant RT with Dr Sondra Come 10/07/19-11/04/19   11/25/2019 Procedure   PAC removed by Dr Brantley Stage      INTERVAL HISTORY:  Cindy [name] is here for a follow up of breast cancer. She was last seen by me on 08/25/21. She presents to the clinic alone. She reports she is doing well overall, no new complaints. Her only concern is her weight, which is actually down today.   All other systems were reviewed with the patient and are negative.  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 if bipolar works the best   Elevated fasting blood sugar    hx   GERD (gastroesophageal reflux disease)    Several years ago   H/O measles    Headache    Hiatal hernia    History of chicken pox    Hx of migraines    Microscopic hematuria    advised to see urology ? never went   Personal history of chemotherapy    Personal history of radiation therapy    Preventative health care 06/20/2011   Radiation dermatitis    chest    SURGICAL HISTORY: Past Surgical History:  Procedure Laterality Date   BREAST BIOPSY Left 01/2019   BREAST LUMPECTOMY Left 02/2019   BREAST LUMPECTOMY WITH RADIOACTIVE SEED AND SENTINEL LYMPH NODE BIOPSY Left 03/10/2019   Procedure: LEFT BREAST LUMPECTOMY WITH RADIOACTIVE SEED AND LEFT SENTINEL LYMPH NODE MAPPING;  Surgeon: Erroll Luna, MD;  Location: [place];  Service: General;  Laterality: Left;   endometrial polyps     2 surgeries by Dr Cathlean Cower   EYE SURGERY  04/29/2009   Rt eye tighten up   PORT-A-CATH REMOVAL Right 11/25/2019   Procedure: PORT REMOVAL;  Surgeon: Erroll Luna, MD;  Location: [facility];  Service: General;  Laterality: Right;   PORTACATH PLACEMENT N/A 03/10/2019   Procedure: INSERTION PORT-A-CATH WITH ULTRASOUND;  Surgeon: Erroll Luna, MD;  Location: [place];  Service: General;  Laterality: N/A;   steel plate in skull Left    from a crushed skull    I have reviewed the social history and family history with the patient and they are unchanged from previous note.  ALLERGIES:  is allergic to bromfed and venlafaxine.  MEDICATIONS:  Current Outpatient Medications  Medication Sig Dispense Refill   Ascorbic Acid (VITAMIN C PO) Take by mouth.     BIOTIN PO Take by mouth.     cholecalciferol (VITAMIN D3) 25 MCG (1000 UNIT) tablet Take 1,000 Units by mouth daily.     FLUoxetine (PROZAC) 40 MG capsule Take 1 capsule by mouth once daily 90 capsule 1   KRILL OIL PO Take by mouth.     Magnesium 300 MG CAPS Take 300 mg by mouth daily.     Menaquinone-7 (VITAMIN K2 PO) Take by mouth.     Multiple Vitamin (MULTIVITAMIN WITH MINERALS) TABS tablet Take 1 tablet by mouth daily.     VITAMIN E PO Take by mouth.     Zoster Vaccine Adjuvanted [facility]) injection Inject into the muscle. 1 each 1   No current facility-administered medications for this visit.    PHYSICAL EXAMINATION: ECOG PERFORMANCE STATUS: 0 - Asymptomatic  Vitals:   02/28/22 1352  BP: 118/72  Pulse: 85  Resp: 16  Temp: 98.3 F (36.8 C)  SpO2: 98%   Wt Readings from Last 3 Encounters:  02/28/22 154 lb (69.9 kg)  10/03/21 156 lb 3.2 oz (70.9 kg)  08/25/21 157 lb 5 oz (71.4 kg)     GENERAL:alert, no distress and comfortable SKIN: skin color, texture, turgor are normal, no rashes or significant lesions EYES: normal, Conjunctiva are pink and non-injected, sclera clear  NECK: supple, thyroid normal size, non-tender, without nodularity LYMPH:  no palpable lymphadenopathy in the cervical, axillary LUNGS: clear to auscultation and percussion with normal breathing effort HEART: regular rate & rhythm and  no murmurs and no lower extremity edema ABDOMEN:abdomen soft, non-tender and normal bowel sounds Musculoskeletal:no cyanosis of digits and no clubbing  NEURO: alert & oriented x 3 with fluent speech, no focal motor/sensory deficits BREAST: No palpable mass, nodules or adenopathy bilaterally. Breast exam benign.   LABORATORY DATA:  I have reviewed the data as listed    Latest Ref Rng & Units 02/28/2022    1:29 PM 09/29/2021   10:51 AM 08/25/2021   12:57 PM  CBC  WBC 4.0 - 10.5 K/uL 5.7  5.5  5.2   Hemoglobin 12.0 - 15.0 g/dL 13.6  13.7  13.5   Hematocrit 36.0 - 46.0 % 39.7  40.5  39.4   Platelets 150 - 400 K/uL 219  217.0  209  Latest Ref Rng & Units 02/28/2022    1:29 PM 09/29/2021   10:51 AM 08/25/2021   12:57 PM  CMP  Glucose 70 - 99 mg/dL 111  112  105   BUN 8 - 23 mg/dL 18  26  23    Creatinine 0.44 - 1.00 mg/dL 0.79  0.77  0.84   Sodium 135 - 145 mmol/L 140  140  139   Potassium 3.5 - 5.1 mmol/L 4.8  5.0  4.4   Chloride 98 - 111 mmol/L 106  104  103   CO2 22 - 32 mmol/L 26  29  30    Calcium 8.9 - 10.3 mg/dL 9.7  9.4  9.9   Total Protein 6.5 - 8.1 g/dL 7.2  6.6  7.3   Total Bilirubin 0.3 - 1.2 mg/dL 0.4  0.4  0.4   Alkaline Phos 38 - 126 U/L 61  62  58   AST 15 - 41 U/L 19  13  15    ALT 0 - 44 U/L 17  11  13        RADIOGRAPHIC STUDIES: I have personally reviewed the radiological images as listed and agreed with the findings in the report. No results found.    No orders of the defined types were placed in this encounter.  All questions were answered. The patient knows to call the clinic with any problems, questions or concerns. No barriers to learning was detected. The total time spent in the appointment was 20 minutes.     Truitt Merle, MD 02/28/2022   I, Wilburn Mylar, am acting as scribe for Truitt Merle, MD.   I have reviewed the above documentation for accuracy and completeness, and I agree with the above.

## 2022-03-01 ENCOUNTER — Other Ambulatory Visit: Payer: Self-pay

## 2022-03-01 ENCOUNTER — Telehealth: Payer: Self-pay

## 2022-03-01 NOTE — Telephone Encounter (Signed)
Pt called stating that she was trying to schedule her mammogram with GI and they told her that the order was for a routine mammogram.  Pt stated she is confused as to why Dr. Burr Medico ordered a routine mammogram b/c the pt stated she has a hx of breast cancer; therefore, she thought she would always have to have a diagnostic mammogram.  Pt is requesting clarification regarding which type of mammogram she is suppose to get.  Notified Dr. Burr Medico.

## 2022-03-02 ENCOUNTER — Telehealth: Payer: Self-pay

## 2022-03-02 ENCOUNTER — Encounter: Payer: Self-pay | Admitting: *Deleted

## 2022-03-02 ENCOUNTER — Telehealth: Payer: Self-pay | Admitting: *Deleted

## 2022-03-02 NOTE — Patient Outreach (Signed)
  Care Coordination   Initial Visit Note   03/02/2022 Name: Cindy [name] MRN: [id number] DOB: [birth year]  Cindy [name] is a 71 y.o. year old female who sees Mosie Lukes, MD for primary care. I spoke with  [name] by phone today. Care Coordinator returned call to patient.  What matters to the patients health and wellness today?  Patient denies any care coordination or resource needs at this time.    Goals Addressed             This Visit's Progress    COMPLETED: Care Coordination Activities- no follow up required       Care Coordination Interventions: Discussed care coordination program Encouraged to contact care coordination and/or primary care provider if care coordination needs in the future. Confirmed patient has contact number.         SDOH assessments and interventions completed:  Yes  SDOH Interventions Today    Flowsheet Row Most Recent Value  SDOH Interventions   Food Insecurity Interventions Intervention Not Indicated  Housing Interventions Intervention Not Indicated  Transportation Interventions Intervention Not Indicated  Utilities Interventions Intervention Not Indicated        Care Coordination Interventions Activated:  Yes  Care Coordination Interventions:  Yes, provided   Follow up plan: No further intervention required.   Encounter Outcome:  Pt. Visit Completed   Thea Silversmith, RN, MSN, BSN, [place] Coordinator [phone]

## 2022-03-02 NOTE — Patient Outreach (Signed)
  Care Coordination   03/02/2022 Name: Cindy [name] MRN: [id number] DOB: 1951/02/11   Care Coordination Outreach Attempts:  A third unsuccessful outreach was attempted today to offer the patient with information about available care coordination services as a benefit of their health plan.   Follow Up Plan:  No further outreach attempts will be made at this time. We have been unable to contact the patient to offer or enroll patient in care coordination services  Encounter Outcome:  No Answer left voice message requesting call back  Care Coordination Interventions Activated:  No   Care Coordination Interventions:  No, not indicated unsuccessful outreach attempt # Weber, RN, BSN, CCRN Alumnus RN CM Care Coordination/ [facility] [place] [facility] [phone]: direct office

## 2022-03-02 NOTE — Telephone Encounter (Signed)
Contacted patient with Dr. Ernestina Penna response: Please let her know we do diagnostic MM for 2 years after surgery then change back to routine screening MM.   Patient verbalized understanding of information.

## 2022-03-03 ENCOUNTER — Other Ambulatory Visit: Payer: Self-pay

## 2022-03-30 ENCOUNTER — Other Ambulatory Visit: Payer: PPO

## 2022-04-03 ENCOUNTER — Ambulatory Visit: Payer: PPO | Admitting: Family Medicine

## 2022-05-09 NOTE — Telephone Encounter (Signed)
error 

## 2022-05-10 ENCOUNTER — Other Ambulatory Visit (INDEPENDENT_AMBULATORY_CARE_PROVIDER_SITE_OTHER): Payer: PPO

## 2022-05-10 ENCOUNTER — Other Ambulatory Visit: Payer: Self-pay

## 2022-05-10 DIAGNOSIS — Z0000 Encounter for general adult medical examination without abnormal findings: Secondary | ICD-10-CM

## 2022-05-10 DIAGNOSIS — E785 Hyperlipidemia, unspecified: Secondary | ICD-10-CM | POA: Diagnosis not present

## 2022-05-10 DIAGNOSIS — R7309 Other abnormal glucose: Secondary | ICD-10-CM | POA: Diagnosis not present

## 2022-05-10 DIAGNOSIS — G43909 Migraine, unspecified, not intractable, without status migrainosus: Secondary | ICD-10-CM

## 2022-05-10 DIAGNOSIS — K449 Diaphragmatic hernia without obstruction or gangrene: Secondary | ICD-10-CM

## 2022-05-10 LAB — LIPID PANEL
Cholesterol: 185 mg/dL (ref 0–200)
HDL: 68.9 mg/dL (ref >39.00)
LDL Cholesterol: 104 mg/dL — ABNORMAL HIGH (ref 0–99)
NonHDL: 115.61
Total CHOL/HDL Ratio: 3
Triglycerides: 60 mg/dL (ref 0.0–149.0)
VLDL: 12 mg/dL (ref 0.0–40.0)

## 2022-05-10 LAB — COMPREHENSIVE METABOLIC PANEL
ALT: 11 U/L (ref 0–35)
AST: 13 U/L (ref 0–37)
Albumin: 4.3 g/dL (ref 3.5–5.2)
Alkaline Phosphatase: 58 U/L (ref 39–117)
BUN: 19 mg/dL (ref 6–23)
CO2: 30 mEq/L (ref 19–32)
Calcium: 9.3 mg/dL (ref 8.4–10.5)
Chloride: 105 mEq/L (ref 96–112)
Creatinine, Ser: 0.79 mg/dL (ref 0.40–1.20)
GFR: 75.98 mL/min (ref >60.00)
Glucose, Bld: 111 mg/dL — ABNORMAL HIGH (ref 70–99)
Potassium: 5.3 mEq/L — ABNORMAL HIGH (ref 3.5–5.1)
Sodium: 140 mEq/L (ref 135–145)
Total Bilirubin: 0.4 mg/dL (ref 0.2–1.2)
Total Protein: 6.7 g/dL (ref 6.0–8.3)

## 2022-05-10 LAB — CBC WITH DIFFERENTIAL/PLATELET
Basophils Absolute: 0 10*3/uL (ref 0.0–0.1)
Basophils Relative: 0.4 % (ref 0.0–3.0)
Eosinophils Absolute: 0.1 10*3/uL (ref 0.0–0.7)
Eosinophils Relative: 1.5 % (ref 0.0–5.0)
HCT: 40.3 % (ref 36.0–46.0)
Hemoglobin: 13.8 g/dL (ref 12.0–15.0)
Lymphocytes Relative: 35.6 % (ref 12.0–46.0)
Lymphs Abs: 1.9 10*3/uL (ref 0.7–4.0)
MCHC: 34.3 g/dL (ref 30.0–36.0)
MCV: 93.5 fl (ref 78.0–100.0)
Monocytes Absolute: 0.4 10*3/uL (ref 0.1–1.0)
Monocytes Relative: 7.2 % (ref 3.0–12.0)
Neutro Abs: 2.9 10*3/uL (ref 1.4–7.7)
Neutrophils Relative %: 55.3 % (ref 43.0–77.0)
Platelets: 244 10*3/uL (ref 150.0–400.0)
RBC: 4.31 Mil/uL (ref 3.87–5.11)
RDW: 13.7 % (ref 11.5–15.5)
WBC: 5.3 10*3/uL (ref 4.0–10.5)

## 2022-05-10 LAB — HEMOGLOBIN A1C: Hgb A1c MFr Bld: 6 % (ref 4.6–6.5)

## 2022-05-10 LAB — TSH: TSH: 1.11 u[IU]/mL (ref 0.35–5.50)

## 2022-05-10 NOTE — Addendum Note (Signed)
Addended by: Manuela Schwartz on: 05/10/2022 11:05 AM   Modules accepted: Orders

## 2022-05-13 DIAGNOSIS — E875 Hyperkalemia: Secondary | ICD-10-CM | POA: Insufficient documentation

## 2022-05-13 NOTE — Assessment & Plan Note (Signed)
On Fluoxetine  

## 2022-05-13 NOTE — Assessment & Plan Note (Addendum)
Asymptomatic, hydrate well and repeat cmp she was taking milk thistle when this occurred and she has stopped it now.

## 2022-05-13 NOTE — Assessment & Plan Note (Signed)
Encourage heart healthy diet such as MIND or DASH diet, increase exercise, avoid trans fats, simple carbohydrates and processed foods, consider a krill or fish or flaxseed oil cap daily.  °

## 2022-05-13 NOTE — Assessment & Plan Note (Addendum)
hgba1c acceptable, minimize simple carbs. Increase exercise as tolerated. RSV (respiratory syncitial virus) vaccine at pharmacy, Arexvy Covid booster at pharmacy High dose flu shot  Consider Prevnar 20

## 2022-05-14 ENCOUNTER — Other Ambulatory Visit: Payer: Self-pay

## 2022-05-14 ENCOUNTER — Other Ambulatory Visit (HOSPITAL_BASED_OUTPATIENT_CLINIC_OR_DEPARTMENT_OTHER): Payer: Self-pay

## 2022-05-14 ENCOUNTER — Ambulatory Visit (INDEPENDENT_AMBULATORY_CARE_PROVIDER_SITE_OTHER): Payer: PPO | Admitting: Family Medicine

## 2022-05-14 ENCOUNTER — Telehealth: Payer: Self-pay | Admitting: Family Medicine

## 2022-05-14 VITALS — BP 118/64 | HR 85 | Temp 98.0°F | Resp 16 | Ht 64.0 in | Wt 153.4 lb

## 2022-05-14 DIAGNOSIS — R7309 Other abnormal glucose: Secondary | ICD-10-CM

## 2022-05-14 DIAGNOSIS — E875 Hyperkalemia: Secondary | ICD-10-CM | POA: Diagnosis not present

## 2022-05-14 DIAGNOSIS — F32A Depression, unspecified: Secondary | ICD-10-CM

## 2022-05-14 DIAGNOSIS — E785 Hyperlipidemia, unspecified: Secondary | ICD-10-CM

## 2022-05-14 DIAGNOSIS — Z0000 Encounter for general adult medical examination without abnormal findings: Secondary | ICD-10-CM

## 2022-05-14 DIAGNOSIS — G43909 Migraine, unspecified, not intractable, without status migrainosus: Secondary | ICD-10-CM | POA: Diagnosis not present

## 2022-05-14 MED ORDER — AREXVY 120 MCG/0.5ML IM SUSR
INTRAMUSCULAR | 0 refills | Status: DC
  Filled 2022-05-14: qty 0.5, 1d supply, fill #0

## 2022-05-14 NOTE — Progress Notes (Signed)
Subjective:    Patient ID: Cindy [name], female    DOB: Aug 12, 1950, 71 y.o.   MRN: [id number]  Chief Complaint  Patient presents with   Follow-up    Follow up    HPI Patient is in today for follow up on chronic medical concerns. No recent febrile illness or acute hospitalizations. No complaints of polyuria or polydipsia. She continues to follow with oncology regarding breast cancer and no recent changes. She did have a migraine recently but only once and no concerning associated symptoms.   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 if bipolar works the best   Elevated fasting blood sugar    hx   GERD (gastroesophageal reflux disease)    Several years ago   H/O measles    Headache    Hiatal hernia    History of chicken pox    Hx of migraines    Microscopic hematuria    advised to see urology ? never went   Personal history of chemotherapy    Personal history of radiation therapy    Preventative health care 06/20/2011   Radiation dermatitis    chest    Past Surgical History:  Procedure Laterality Date   BREAST BIOPSY Left 01/2019   BREAST LUMPECTOMY Left 02/2019   BREAST LUMPECTOMY WITH RADIOACTIVE SEED AND SENTINEL LYMPH NODE BIOPSY Left 03/10/2019   Procedure: LEFT BREAST LUMPECTOMY WITH RADIOACTIVE SEED AND LEFT SENTINEL LYMPH NODE MAPPING;  Surgeon: Erroll Luna, MD;  Location: [place];  Service: General;  Laterality: Left;   endometrial polyps     2 surgeries by Dr Cathlean Cower   EYE SURGERY  04/29/2009   Rt eye tighten up   PORT-A-CATH REMOVAL Right 11/25/2019   Procedure: PORT REMOVAL;  Surgeon: Erroll Luna, MD;  Location: [place];  Service: General;  Laterality: Right;   PORTACATH PLACEMENT N/A 03/10/2019   Procedure: INSERTION PORT-A-CATH WITH ULTRASOUND;  Surgeon:  Erroll Luna, MD;  Location[facility];  Service: General;  Laterality: N/A;   steel plate in skull Left    from a crushed skull    Family History  Problem Relation Age of Onset   Alzheimer's disease Mother    Cancer Mother        colon   Thyroid disease Father    COPD Father    Cancer Father        bladder with cystectomy   Cancer Maternal Grandfather        colon cancer   Cancer Other        maternal grant granmother had breast cancer     Social History   Socioeconomic History   Marital status: Single    Spouse name: Not on file   Number of children: Not on file   Years of education: Not on file   Highest education level: Not on file  Occupational History   Occupation: retired   Tobacco Use   Smoking status: Former    Packs/day: 0.25    Types: Cigarettes    Quit date: 11/23/1981    Years since quitting: 40.4   Smokeless tobacco: Never   Tobacco comments:    social smoker in her 20-30's   Vaping Use   Vaping Use: Never used  Substance and Sexual  Activity   Alcohol use: Yes    Alcohol/week: 1.0 - 2.0 standard drink of alcohol    Types: 1 - 2 Glasses of wine per week    Comment: very rare on social occasions   Drug use: No   Sexual activity: Not Currently    Birth control/protection: None  Other Topics Concern   Not on file  Social History Narrative   HH of 1   2 Cats   Caffeine 2 mugs   Exercise recently some, no major dietary restrictions at this time. Was vegan now eats chicken and poultry   Works at [employer] in [place] Strain: Not on file  Food Insecurity: No Food Insecurity (03/02/2022)   Hunger Vital Sign    Worried About Running Out of Food in the Last Year: Never true    Ran Out of Food in the Last Year: Never true  Transportation Needs: No Transportation Needs (03/02/2022)   PRAPARE - Hydrologist (Medical): No    Lack of Transportation (Non-Medical): No  Physical  Activity: Not on file  Stress: Not on file  Social Connections: Not on file  Intimate Partner Violence: Not on file    Outpatient Medications Prior to Visit  Medication Sig Dispense Refill   Ascorbic Acid (VITAMIN C PO) Take by mouth.     BIOTIN PO Take by mouth.     cholecalciferol (VITAMIN D3) 25 MCG (1000 UNIT) tablet Take 1,000 Units by mouth daily.     FLUoxetine (PROZAC) 40 MG capsule Take 1 capsule by mouth once daily 90 capsule 1   KRILL OIL PO Take by mouth.     Magnesium 300 MG CAPS Take 300 mg by mouth daily.     Menaquinone-7 (VITAMIN K2 PO) Take by mouth.     Multiple Vitamin (MULTIVITAMIN WITH MINERALS) TABS tablet Take 1 tablet by mouth daily.     VITAMIN E PO Take by mouth.     Zoster Vaccine Adjuvanted [place]) injection Inject into the muscle. 1 each 1   No facility-administered medications prior to visit.    Allergies  Allergen Reactions   Bromfed Shortness Of Breath   Venlafaxine     Doesn't remember reaction    Review of Systems  Constitutional:  Negative for fever and malaise/fatigue.  HENT:  Negative for congestion.   Eyes:  Negative for blurred vision.  Respiratory:  Negative for shortness of breath.   Cardiovascular:  Negative for chest pain, palpitations and leg swelling.  Gastrointestinal:  Negative for abdominal pain, blood in stool and nausea.  Genitourinary:  Negative for dysuria and frequency.  Musculoskeletal:  Negative for falls.  Skin:  Negative for rash.  Neurological:  Positive for headaches. Negative for dizziness and loss of consciousness.  Endo/Heme/Allergies:  Negative for environmental allergies.  Psychiatric/Behavioral:  Negative for depression. The patient is not nervous/anxious.        Objective:    Physical Exam Constitutional:      General: She is not in acute distress.    Appearance: She is well-developed. She is not diaphoretic.  HENT:     Head: Normocephalic and atraumatic.     Right Ear: External ear normal.      Left Ear: External ear normal.     Nose: Nose normal.     Mouth/Throat:     Pharynx: No oropharyngeal exudate.  Eyes:     General: No scleral icterus.  Right eye: No discharge.        Left eye: No discharge.     Conjunctiva/sclera: Conjunctivae normal.     Pupils: Pupils are equal, round, and reactive to light.  Neck:     Thyroid: No thyromegaly.  Cardiovascular:     Rate and Rhythm: Normal rate and regular rhythm.     Heart sounds: Normal heart sounds. No murmur heard. Pulmonary:     Effort: Pulmonary effort is normal. No respiratory distress.     Breath sounds: Normal breath sounds. No wheezing or rales.  Abdominal:     General: Bowel sounds are normal. There is no distension.     Palpations: Abdomen is soft. There is no mass.     Tenderness: There is no abdominal tenderness.  Musculoskeletal:        General: No tenderness. Normal range of motion.     Cervical back: Normal range of motion and neck supple.  Lymphadenopathy:     Cervical: No cervical adenopathy.  Skin:    General: Skin is warm and dry.     Findings: No rash.  Neurological:     Mental Status: She is alert and oriented to person, place, and time.     Cranial Nerves: No cranial nerve deficit.     Coordination: Coordination normal.     Deep Tendon Reflexes: Reflexes are normal and symmetric. Reflexes normal.  Psychiatric:        Behavior: Behavior normal.     BP 118/64 (BP Location: Right Arm, Patient Position: Sitting, Cuff Size: Normal)   Pulse 85   Temp 98 F (36.7 C) (Oral)   Resp 16   Ht '5\' 4"'$  (1.626 m)   Wt 153 lb 6.4 oz (69.6 kg)   SpO2 95%   BMI 26.33 kg/m  Wt Readings from Last 3 Encounters:  05/14/22 153 lb 6.4 oz (69.6 kg)  02/28/22 154 lb (69.9 kg)  10/03/21 156 lb 3.2 oz (70.9 kg)    Diabetic Foot Exam - Simple   No data filed    Lab Results  Component Value Date   WBC 5.3 05/10/2022   HGB 13.8 05/10/2022   HCT 40.3 05/10/2022   PLT 244.0 05/10/2022   GLUCOSE 111 (H)  05/10/2022   CHOL 185 05/10/2022   TRIG 60.0 05/10/2022   HDL 68.90 05/10/2022   LDLDIRECT 123.1 06/25/2011   LDLCALC 104 (H) 05/10/2022   ALT 11 05/10/2022   AST 13 05/10/2022   NA 140 05/10/2022   K 5.3 (H) 05/10/2022   CL 105 05/10/2022   CREATININE 0.79 05/10/2022   BUN 19 05/10/2022   CO2 30 05/10/2022   TSH 1.11 05/10/2022   HGBA1C 6.0 05/10/2022    Lab Results  Component Value Date   TSH 1.11 05/10/2022   Lab Results  Component Value Date   WBC 5.3 05/10/2022   HGB 13.8 05/10/2022   HCT 40.3 05/10/2022   MCV 93.5 05/10/2022   PLT 244.0 05/10/2022   Lab Results  Component Value Date   NA 140 05/10/2022   K 5.3 (H) 05/10/2022   CO2 30 05/10/2022   GLUCOSE 111 (H) 05/10/2022   BUN 19 05/10/2022   CREATININE 0.79 05/10/2022   BILITOT 0.4 05/10/2022   ALKPHOS 58 05/10/2022   AST 13 05/10/2022   ALT 11 05/10/2022   PROT 6.7 05/10/2022   ALBUMIN 4.3 05/10/2022   CALCIUM 9.3 05/10/2022   ANIONGAP 8 02/28/2022   GFR 75.98 05/10/2022   Lab Results  Component Value Date   CHOL 185 05/10/2022   Lab Results  Component Value Date   HDL 68.90 05/10/2022   Lab Results  Component Value Date   LDLCALC 104 (H) 05/10/2022   Lab Results  Component Value Date   TRIG 60.0 05/10/2022   Lab Results  Component Value Date   CHOLHDL 3 05/10/2022   Lab Results  Component Value Date   HGBA1C 6.0 05/10/2022       Assessment & Plan:   Problem List Items Addressed This Visit     Depression    On Fluoxetine      Migraine headache    She recently had her migraine in many years.was several months ago and has not returned. She is encouraged to keep some Excedrine migraine on hand and we can consider Promethazine prn if more headaches occur. Encouraged increased hydration, 64 ounces of clear fluids daily. Minimize alcohol and caffeine. Eat small frequent meals with lean proteins and complex carbs. Avoid high and low blood sugars. Get adequate sleep, 7-8 hours a  night. Needs exercise daily preferably in the morning.       HYPERGLYCEMIA - Primary    hgba1c acceptable, minimize simple carbs. Increase exercise as tolerated. RSV (respiratory syncitial virus) vaccine at pharmacy, Arexvy Covid booster at pharmacy High dose flu shot  Consider Prevnar 20      Hyperlipidemia    Encourage heart healthy diet such as MIND or DASH diet, increase exercise, avoid trans fats, simple carbohydrates and processed foods, consider a krill or fish or flaxseed oil cap daily.        Hyperkalemia    Asymptomatic, hydrate well and repeat cmp she was taking milk thistle when this occurred and she has stopped it now.       Relevant Orders   Comprehensive metabolic panel    I am having Cindy [name] maintain her multivitamin with minerals, BIOTIN PO, Ascorbic Acid (VITAMIN C PO), cholecalciferol, KRILL OIL PO, Magnesium, Menaquinone-7 (VITAMIN K2 PO), VITAMIN E PO, Shingrix, and FLUoxetine.  No orders of the defined types were placed in this encounter.    Penni Homans, MD

## 2022-05-14 NOTE — Assessment & Plan Note (Signed)
She recently had her migraine in many years.was several months ago and has not returned. She is encouraged to keep some Excedrine migraine on hand and we can consider Promethazine prn if more headaches occur. Encouraged increased hydration, 64 ounces of clear fluids daily. Minimize alcohol and caffeine. Eat small frequent meals with lean proteins and complex carbs. Avoid high and low blood sugars. Get adequate sleep, 7-8 hours a night. Needs exercise daily preferably in the morning.

## 2022-05-14 NOTE — Telephone Encounter (Signed)
Labs ordered.

## 2022-05-14 NOTE — Patient Instructions (Addendum)
RSV (respiratory syncitial virus) vaccine at pharmacy, Arexvy Covid booster at pharmacy Prevnar 20

## 2022-05-14 NOTE — Telephone Encounter (Signed)
Patient would like to do a lab appt a week prior to her physical. Lab appt was made, please add lab orders.

## 2022-05-15 LAB — COMPREHENSIVE METABOLIC PANEL
ALT: 12 U/L (ref 0–35)
AST: 13 U/L (ref 0–37)
Albumin: 4.4 g/dL (ref 3.5–5.2)
Alkaline Phosphatase: 62 U/L (ref 39–117)
BUN: 19 mg/dL (ref 6–23)
CO2: 31 mEq/L (ref 19–32)
Calcium: 10.1 mg/dL (ref 8.4–10.5)
Chloride: 102 mEq/L (ref 96–112)
Creatinine, Ser: 0.79 mg/dL (ref 0.40–1.20)
GFR: 75.98 mL/min (ref >60.00)
Glucose, Bld: 100 mg/dL — ABNORMAL HIGH (ref 70–99)
Potassium: 4.8 mEq/L (ref 3.5–5.1)
Sodium: 141 mEq/L (ref 135–145)
Total Bilirubin: 0.4 mg/dL (ref 0.2–1.2)
Total Protein: 6.8 g/dL (ref 6.0–8.3)

## 2022-05-18 ENCOUNTER — Telehealth: Payer: Self-pay | Admitting: *Deleted

## 2022-05-18 NOTE — Telephone Encounter (Signed)
LMOM for pt to schedule AWV. 

## 2022-05-30 ENCOUNTER — Ambulatory Visit
Admission: RE | Admit: 2022-05-30 | Discharge: 2022-05-30 | Disposition: A | Discharge status: Home / Self Care | Payer: PPO | Source: Ambulatory Visit | Attending: Hematology | Admitting: Hematology

## 2022-05-30 DIAGNOSIS — Z1231 Encounter for screening mammogram for malignant neoplasm of breast: Secondary | ICD-10-CM

## 2022-06-08 ENCOUNTER — Other Ambulatory Visit: Payer: Self-pay | Admitting: Hematology

## 2022-06-08 DIAGNOSIS — Z1231 Encounter for screening mammogram for malignant neoplasm of breast: Secondary | ICD-10-CM

## 2022-06-18 ENCOUNTER — Encounter: Payer: Self-pay | Admitting: Hematology

## 2022-06-28 ENCOUNTER — Other Ambulatory Visit (HOSPITAL_BASED_OUTPATIENT_CLINIC_OR_DEPARTMENT_OTHER): Payer: Self-pay

## 2022-06-28 MED ORDER — PREVNAR 20 0.5 ML IM SUSY
PREFILLED_SYRINGE | INTRAMUSCULAR | 0 refills | Status: DC
  Filled 2022-06-28: qty 0.5, 1d supply, fill #0

## 2022-07-03 ENCOUNTER — Other Ambulatory Visit: Payer: Self-pay | Admitting: Family Medicine

## 2022-07-05 DIAGNOSIS — Z1212 Encounter for screening for malignant neoplasm of rectum: Secondary | ICD-10-CM | POA: Diagnosis not present

## 2022-07-24 ENCOUNTER — Encounter: Payer: Self-pay | Admitting: Family Medicine

## 2022-07-30 NOTE — Telephone Encounter (Signed)
Ok great.

## 2022-07-30 NOTE — Telephone Encounter (Signed)
[facility] called, they had sent pt this test and that's why she mailed it back to [facility]. They are going ot fax over pt's results.

## 2022-08-01 DIAGNOSIS — H35373 Puckering of macula, bilateral: Secondary | ICD-10-CM | POA: Diagnosis not present

## 2022-08-01 DIAGNOSIS — H524 Presbyopia: Secondary | ICD-10-CM | POA: Diagnosis not present

## 2022-08-01 DIAGNOSIS — H52223 Regular astigmatism, bilateral: Secondary | ICD-10-CM | POA: Diagnosis not present

## 2022-08-01 DIAGNOSIS — H43813 Vitreous degeneration, bilateral: Secondary | ICD-10-CM | POA: Diagnosis not present

## 2022-08-01 DIAGNOSIS — H2513 Age-related nuclear cataract, bilateral: Secondary | ICD-10-CM | POA: Diagnosis not present

## 2022-09-26 ENCOUNTER — Encounter: Payer: Self-pay | Admitting: Family Medicine

## 2022-09-26 ENCOUNTER — Other Ambulatory Visit: Payer: Self-pay

## 2022-09-26 MED ORDER — FLUOXETINE HCL 40 MG PO CAPS: 40.0000 mg | ORAL_CAPSULE | Freq: Every day | ORAL | 2 refills | Status: DC

## 2022-09-26 NOTE — Telephone Encounter (Signed)
Refill has been sent.  °

## 2022-09-27 ENCOUNTER — Telehealth: Payer: Self-pay | Admitting: Family Medicine

## 2022-09-27 NOTE — Telephone Encounter (Signed)
Contacted [facility] to schedule their annual wellness visit. Patient declined to schedule AWV at this time.REFUSED DO NOT CALL  Verlee Rossetti; Care Guide Ambulatory Clinical Support [place] [facility] Direct Dial: [phone]

## 2022-11-02 ENCOUNTER — Other Ambulatory Visit (INDEPENDENT_AMBULATORY_CARE_PROVIDER_SITE_OTHER): Payer: PPO

## 2022-11-02 DIAGNOSIS — R7309 Other abnormal glucose: Secondary | ICD-10-CM | POA: Diagnosis not present

## 2022-11-02 DIAGNOSIS — Z0000 Encounter for general adult medical examination without abnormal findings: Secondary | ICD-10-CM

## 2022-11-02 DIAGNOSIS — E785 Hyperlipidemia, unspecified: Secondary | ICD-10-CM | POA: Diagnosis not present

## 2022-11-02 LAB — CBC WITH DIFFERENTIAL/PLATELET
Basophils Absolute: 0 10*3/uL (ref 0.0–0.1)
Basophils Relative: 0.4 % (ref 0.0–3.0)
Eosinophils Absolute: 0.1 10*3/uL (ref 0.0–0.7)
Eosinophils Relative: 1.9 % (ref 0.0–5.0)
HCT: 38.9 % (ref 36.0–46.0)
Hemoglobin: 13.1 g/dL (ref 12.0–15.0)
Lymphocytes Relative: 30.5 % (ref 12.0–46.0)
Lymphs Abs: 1.7 10*3/uL (ref 0.7–4.0)
MCHC: 33.8 g/dL (ref 30.0–36.0)
MCV: 95.2 fl (ref 78.0–100.0)
Monocytes Absolute: 0.3 10*3/uL (ref 0.1–1.0)
Monocytes Relative: 6.2 % (ref 3.0–12.0)
Neutro Abs: 3.4 10*3/uL (ref 1.4–7.7)
Neutrophils Relative %: 61 % (ref 43.0–77.0)
Platelets: 241 10*3/uL (ref 150.0–400.0)
RBC: 4.09 Mil/uL (ref 3.87–5.11)
RDW: 13.8 % (ref 11.5–15.5)
WBC: 5.6 10*3/uL (ref 4.0–10.5)

## 2022-11-02 LAB — LIPID PANEL
Cholesterol: 191 mg/dL (ref 0–200)
HDL: 65.8 mg/dL (ref >39.00)
LDL Cholesterol: 112 mg/dL — ABNORMAL HIGH (ref 0–99)
NonHDL: 125.64
Total CHOL/HDL Ratio: 3
Triglycerides: 67 mg/dL (ref 0.0–149.0)
VLDL: 13.4 mg/dL (ref 0.0–40.0)

## 2022-11-02 LAB — COMPREHENSIVE METABOLIC PANEL
ALT: 11 U/L (ref 0–35)
AST: 13 U/L (ref 0–37)
Albumin: 4.1 g/dL (ref 3.5–5.2)
Alkaline Phosphatase: 54 U/L (ref 39–117)
BUN: 20 mg/dL (ref 6–23)
CO2: 29 mEq/L (ref 19–32)
Calcium: 9.4 mg/dL (ref 8.4–10.5)
Chloride: 105 mEq/L (ref 96–112)
Creatinine, Ser: 0.81 mg/dL (ref 0.40–1.20)
GFR: 73.49 mL/min (ref >60.00)
Glucose, Bld: 106 mg/dL — ABNORMAL HIGH (ref 70–99)
Potassium: 5.8 mEq/L — ABNORMAL HIGH (ref 3.5–5.1)
Sodium: 142 mEq/L (ref 135–145)
Total Bilirubin: 0.5 mg/dL (ref 0.2–1.2)
Total Protein: 6.5 g/dL (ref 6.0–8.3)

## 2022-11-02 LAB — HEMOGLOBIN A1C: Hgb A1c MFr Bld: 5.8 % (ref 4.6–6.5)

## 2022-11-02 LAB — TSH: TSH: 1.27 u[IU]/mL (ref 0.35–5.50)

## 2022-11-05 NOTE — Assessment & Plan Note (Signed)
hgba1c acceptable, minimize simple carbs. Increase exercise as tolerated.  

## 2022-11-05 NOTE — Assessment & Plan Note (Signed)
Patient encouraged to maintain heart healthy diet, regular exercise, adequate sleep. Consider daily probiotics. Take medications as prescribed. Labs ordered and reviewed. MGM annually with oncology. Had iFOB August 2022 and declines colonoscopy due to prep, she will consider repeating one. Last Dexa normal in April 2022, repeat in 3-4 years. Pap 2020, repeat 2024 or 25

## 2022-11-05 NOTE — Assessment & Plan Note (Signed)
Encourage heart healthy diet such as MIND or DASH diet, increase exercise, avoid trans fats, simple carbohydrates and processed foods, consider a krill or fish or flaxseed oil cap daily.  °

## 2022-11-05 NOTE — Assessment & Plan Note (Signed)
Following with oncology doing well 

## 2022-11-06 ENCOUNTER — Other Ambulatory Visit: Payer: Self-pay

## 2022-11-06 ENCOUNTER — Ambulatory Visit (INDEPENDENT_AMBULATORY_CARE_PROVIDER_SITE_OTHER): Payer: PPO | Admitting: Family Medicine

## 2022-11-06 VITALS — BP 116/70 | HR 96 | Temp 97.5°F | Resp 16 | Ht 64.0 in | Wt 148.0 lb

## 2022-11-06 DIAGNOSIS — Z0000 Encounter for general adult medical examination without abnormal findings: Secondary | ICD-10-CM | POA: Diagnosis not present

## 2022-11-06 DIAGNOSIS — E785 Hyperlipidemia, unspecified: Secondary | ICD-10-CM | POA: Diagnosis not present

## 2022-11-06 DIAGNOSIS — Z853 Personal history of malignant neoplasm of breast: Secondary | ICD-10-CM | POA: Diagnosis not present

## 2022-11-06 DIAGNOSIS — F32A Depression, unspecified: Secondary | ICD-10-CM | POA: Diagnosis not present

## 2022-11-06 DIAGNOSIS — R7309 Other abnormal glucose: Secondary | ICD-10-CM | POA: Diagnosis not present

## 2022-11-06 DIAGNOSIS — E875 Hyperkalemia: Secondary | ICD-10-CM

## 2022-11-06 DIAGNOSIS — C50212 Malignant neoplasm of upper-inner quadrant of left female breast: Secondary | ICD-10-CM

## 2022-11-06 MED ORDER — FLUOXETINE HCL 40 MG PO CAPS: 40.0000 mg | ORAL_CAPSULE | Freq: Every day | ORAL | 1 refills | Status: DC

## 2022-11-06 NOTE — Patient Instructions (Addendum)
4000 steps and 8000 steps   60-80 ounces a day  6-8 hours sleep     Preventive Care 65 Years and Older, Female Preventive care refers to lifestyle choices and visits with your health care provider that can promote health and wellness. Preventive care visits are also called wellness exams. What can I expect for my preventive care visit? Counseling Your health care provider may ask you questions about your: Medical history, including: Past medical problems. Family medical history. Pregnancy and menstrual history. History of falls. Current health, including: Memory and ability to understand (cognition). Emotional well-being. Home life and relationship well-being. Sexual activity and sexual health. Lifestyle, including: Alcohol, nicotine or tobacco, and drug use. Access to firearms. Diet, exercise, and sleep habits. Work and work Astronomer. Sunscreen use. Safety issues such as seatbelt and bike helmet use. Physical exam Your health care provider will check your: Height and weight. These may be used to calculate your BMI (body mass index). BMI is a measurement that tells if you are at a healthy weight. Waist circumference. This measures the distance around your waistline. This measurement also tells if you are at a healthy weight and may help predict your risk of certain diseases, such as type 2 diabetes and high blood pressure. Heart rate and blood pressure. Body temperature. Skin for abnormal spots. What immunizations do I need?  Vaccines are usually given at various ages, according to a schedule. Your health care provider will recommend vaccines for you based on your age, medical history, and lifestyle or other factors, such as travel or where you work. What tests do I need? Screening Your health care provider may recommend screening tests for certain conditions. This may include: Lipid and cholesterol levels. Hepatitis C test. Hepatitis B test. HIV (human  immunodeficiency virus) test. STI (sexually transmitted infection) testing, if you are at risk. Lung cancer screening. Colorectal cancer screening. Diabetes screening. This is done by checking your blood sugar (glucose) after you have not eaten for a while (fasting). Mammogram. Talk with your health care provider about how often you should have regular mammograms. BRCA-related cancer screening. This may be done if you have a family history of breast, ovarian, tubal, or peritoneal cancers. Bone density scan. This is done to screen for osteoporosis. Talk with your health care provider about your test results, treatment options, and if necessary, the need for more tests. Follow these instructions at home: Eating and drinking  Eat a diet that includes fresh fruits and vegetables, whole grains, lean protein, and low-fat dairy products. Limit your intake of foods with high amounts of sugar, saturated fats, and salt. Take vitamin and mineral supplements as recommended by your health care provider. Do not drink alcohol if your health care provider tells you not to drink. If you drink alcohol: Limit how much you have to 0-1 drink a day. Know how much alcohol is in your drink. In the [place] one drink equals one 12 oz bottle of beer (355 mL), one 5 oz glass of wine (148 mL), or one 1 oz glass of hard liquor (44 mL). Lifestyle Brush your teeth every morning and night with fluoride toothpaste. Floss one time each day. Exercise for at least 30 minutes 5 or more days each week. Do not use any products that contain nicotine or tobacco. These products include cigarettes, chewing tobacco, and vaping devices, such as e-cigarettes. If you need help quitting, ask your health care provider. Do not use drugs. If you are sexually active, practice safe sex.  Use a condom or other form of protection in order to prevent STIs. Take aspirin only as told by your health care provider. Make sure that you understand how much  to take and what form to take. Work with your health care provider to find out whether it is safe and beneficial for you to take aspirin daily. Ask your health care provider if you need to take a cholesterol-lowering medicine (statin). Find healthy ways to manage stress, such as: Meditation, yoga, or listening to music. Journaling. Talking to a trusted person. Spending time with friends and family. Minimize exposure to UV radiation to reduce your risk of skin cancer. Safety Always wear your seat belt while driving or riding in a vehicle. Do not drive: If you have been drinking alcohol. Do not ride with someone who has been drinking. When you are tired or distracted. While texting. If you have been using any mind-altering substances or drugs. Wear a helmet and other protective equipment during sports activities. If you have firearms in your house, make sure you follow all gun safety procedures. What's next? Visit your health care provider once a year for an annual wellness visit. Ask your health care provider how often you should have your eyes and teeth checked. Stay up to date on all vaccines. This information is not intended to replace advice given to you by your health care provider. Make sure you discuss any questions you have with your health care provider. Document Revised: 11/23/2020 Document Reviewed: 11/23/2020 Elsevier Patient Education  2024 [employer].

## 2022-11-06 NOTE — Progress Notes (Addendum)
Subjective:   By signing my name below, I, [name], attest that this documentation has been prepared under the direction and in the presence of Bradd Canary, MD., 11/06/2022.   Patient ID: Cindy [name], female    DOB: Aug 20, 1950, 72 y.o.   MRN: [id number]  Chief Complaint  Patient presents with   Annual Exam   HPI Patient is in today for a comprehensive physical exam and follow up on chronic medical concerns. No complaints of recent hospitalization, febrile illness, CP/palpitations/SOB/HA/fever/chills/GI or GU symptoms.  Anxiety and Depression Patient continues taking Fluoxetine 40 mg which has been tolerable.   Diet/Exercise Patient admits that she does not exercise regularly, though she does chair yoga occasionally. She prefers cooler temperatures and this is when she is most active. She does well at hydrating and experiences headaches when dehydrated.  Immunization Patient's last Tetanus vaccination was administered on 12/23/2012 and she is interested in receiving another in 12/2022. She continues to receive annual COVID-19 and Influenza vaccinations.  Malignant Neoplasm of Left Breast Patient continues to follow with oncologist Dr. Malachy Mood annually to monitor the malignant neoplasm of the upper-inner quadrant of the left breast. Today she denies any discomfort or pain in the breasts.  Supplements Patient currently takes biotin, krill oil, magnesium, and vitamins C and E daily.  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 if bipolar works the best   Elevated fasting blood sugar    hx   GERD (gastroesophageal reflux disease)    Several years ago   H/O measles    Headache    Hiatal hernia    History of chicken pox    Hx of migraines    Microscopic hematuria    advised to see urology ? never  went   Personal history of chemotherapy    Personal history of radiation therapy    Preventative health care 06/20/2011   Radiation dermatitis    chest    Past Surgical History:  Procedure Laterality Date   BREAST BIOPSY Left 01/2019   BREAST LUMPECTOMY Left 02/2019   BREAST LUMPECTOMY WITH RADIOACTIVE SEED AND SENTINEL LYMPH NODE BIOPSY Left 03/10/2019   Procedure: LEFT BREAST LUMPECTOMY WITH RADIOACTIVE SEED AND LEFT SENTINEL LYMPH NODE MAPPING;  Surgeon: Harriette Bouillon, MD;  Location: [facility] OR;  Service: General;  Laterality: Left;   endometrial polyps     2 surgeries by Dr Carrolyn Leigh   EYE SURGERY  04/29/2009   Rt eye tighten up   PORT-A-CATH REMOVAL Right 11/25/2019   Procedure: PORT REMOVAL;  Surgeon: Harriette Bouillon, MD;  Location: [facility];  Service: General;  Laterality: Right;   PORTACATH PLACEMENT N/A 03/10/2019   Procedure: INSERTION PORT-A-CATH WITH ULTRASOUND;  Surgeon: Harriette Bouillon, MD;  Location: [facility] OR;  Service: General;  Laterality: N/A;   steel plate in skull Left    from a crushed skull    Family History  Problem Relation Age of Onset   Alzheimer's disease Mother    Cancer Mother        colon   Thyroid disease Father    COPD Father    Cancer Father        bladder with cystectomy   Cancer Maternal Grandfather        colon cancer  Cancer Other        maternal grant granmother had breast cancer     Social History   Socioeconomic History   Marital status: Single    Spouse name: Not on file   Number of children: Not on file   Years of education: Not on file   Highest education level: Not on file  Occupational History   Occupation: retired   Tobacco Use   Smoking status: Former    Packs/day: .25    Types: Cigarettes    Quit date: 11/23/1981    Years since quitting: 40.9   Smokeless tobacco: Never   Tobacco comments:    social smoker in her 20-30's   Vaping Use   Vaping Use: Never used  Substance and Sexual Activity    Alcohol use: Yes    Alcohol/week: 1.0 - 2.0 standard drink of alcohol    Types: 1 - 2 Glasses of wine per week    Comment: very rare on social occasions   Drug use: No   Sexual activity: Not Currently    Birth control/protection: None  Other Topics Concern   Not on file  Social History Narrative   HH of 1   2 Cats   Caffeine 2 mugs   Exercise recently some, no major dietary restrictions at this time. Was vegan now eats chicken and poultry   Works at [employer] in [place] [employer] Strain: Not on file  Food Insecurity: No Food Insecurity (03/02/2022)   Hunger Vital Sign    Worried About Running Out of Food in the Last Year: Never true    Ran Out of Food in the Last Year: Never true  Transportation Needs: No Transportation Needs (03/02/2022)   PRAPARE - Administrator, Civil Service (Medical): No    Lack of Transportation (Non-Medical): No  Physical Activity: Not on file  Stress: Not on file  Social Connections: Not on file  Intimate Partner Violence: Not on file    Outpatient Medications Prior to Visit  Medication Sig Dispense Refill   Ascorbic Acid (VITAMIN C PO) Take by mouth.     BIOTIN PO Take by mouth.     cholecalciferol (VITAMIN D3) 25 MCG (1000 UNIT) tablet Take 1,000 Units by mouth daily.     KRILL OIL PO Take by mouth.     Multiple Vitamin (MULTIVITAMIN WITH MINERALS) TABS tablet Take 1 tablet by mouth daily.     VITAMIN E PO Take by mouth.     FLUoxetine (PROZAC) 40 MG capsule Take 1 capsule (40 mg total) by mouth daily. 90 capsule 2   Magnesium 300 MG CAPS Take 300 mg by mouth daily.     Menaquinone-7 (VITAMIN K2 PO) Take by mouth.     pneumococcal 20-valent conjugate vaccine (PREVNAR 20) 0.5 ML injection Inject into the muscle. 0.5 mL 0   RSV vaccine recomb adjuvanted (AREXVY) 120 MCG/0.5ML injection Inject into the muscle. 0.5 mL 0   Zoster Vaccine Adjuvanted [facility]) injection Inject into the muscle. 1 each 1    No facility-administered medications prior to visit.    Allergies  Allergen Reactions   Bromfed Shortness Of Breath   Venlafaxine     Doesn't remember reaction    Review of Systems  Constitutional:  Negative for chills and fever.  Respiratory:  Negative for shortness of breath.   Cardiovascular:  Negative for chest pain and palpitations.  Gastrointestinal:  Negative for abdominal pain,  blood in stool, constipation, diarrhea, nausea and vomiting.  Genitourinary:  Negative for dysuria, frequency, hematuria and urgency.  Skin:           Neurological:  Negative for headaches.       Objective:    Physical Exam Constitutional:      General: She is not in acute distress.    Appearance: Normal appearance. She is not ill-appearing.  HENT:     Head: Normocephalic and atraumatic.     Right Ear: Tympanic membrane, ear canal and external ear normal.     Left Ear: Tympanic membrane, ear canal and external ear normal.     Nose: Nose normal.     Mouth/Throat:     Mouth: Mucous membranes are moist.     Pharynx: Oropharynx is clear.  Eyes:     General:        Right eye: No discharge.        Left eye: No discharge.     Extraocular Movements: Extraocular movements intact.     Right eye: No nystagmus.     Left eye: No nystagmus.     Pupils: Pupils are equal, round, and reactive to light.  Neck:     Vascular: No carotid bruit.  Cardiovascular:     Rate and Rhythm: Normal rate and regular rhythm.     Pulses: Normal pulses.     Heart sounds: Normal heart sounds. No murmur heard.    No gallop.  Pulmonary:     Effort: Pulmonary effort is normal. No respiratory distress.     Breath sounds: Normal breath sounds. No wheezing or rales.  Abdominal:     General: Bowel sounds are normal.     Palpations: Abdomen is soft.     Tenderness: There is no abdominal tenderness. There is no guarding.  Musculoskeletal:        General: Normal range of motion.     Cervical back: Normal range of  motion.     Right lower leg: No edema.     Left lower leg: No edema.     Comments: Muscle strength 5/5 on upper and lower extremities.   Lymphadenopathy:     Cervical: No cervical adenopathy.  Skin:    General: Skin is warm and dry.     Comments: There are multiple seborrhea keratoses across the back.  Neurological:     Mental Status: She is alert and oriented to person, place, and time.     Sensory: Sensation is intact.     Motor: Motor function is intact.     Coordination: Coordination is intact.     Deep Tendon Reflexes:     Reflex Scores:      Patellar reflexes are 2+ on the right side and 2+ on the left side. Psychiatric:        Mood and Affect: Mood normal.        Behavior: Behavior normal.        Judgment: Judgment normal.     BP 116/70 (BP Location: Right Arm, Patient Position: Sitting, Cuff Size: Normal)   Pulse 96   Temp (!) 97.5 F (36.4 C) (Oral)   Resp 16   Ht 5\' 4"  (1.626 m)   Wt 148 lb (67.1 kg)   SpO2 96%   BMI 25.40 kg/m  Wt Readings from Last 3 Encounters:  11/06/22 148 lb (67.1 kg)  05/14/22 153 lb 6.4 oz (69.6 kg)  02/28/22 154 lb (69.9 kg)    Diabetic Foot Exam -  Simple   No data filed    Lab Results  Component Value Date   WBC 5.6 11/02/2022   HGB 13.1 11/02/2022   HCT 38.9 11/02/2022   PLT 241.0 11/02/2022   GLUCOSE 97 11/06/2022   CHOL 191 11/02/2022   TRIG 67.0 11/02/2022   HDL 65.80 11/02/2022   LDLDIRECT 123.1 06/25/2011   LDLCALC 112 (H) 11/02/2022   ALT 11 11/06/2022   AST 13 11/06/2022   NA 140 11/06/2022   K 4.7 11/06/2022   CL 104 11/06/2022   CREATININE 0.87 11/06/2022   BUN 18 11/06/2022   CO2 30 11/06/2022   TSH 1.27 11/02/2022   HGBA1C 5.8 11/02/2022    Lab Results  Component Value Date   TSH 1.27 11/02/2022   Lab Results  Component Value Date   WBC 5.6 11/02/2022   HGB 13.1 11/02/2022   HCT 38.9 11/02/2022   MCV 95.2 11/02/2022   PLT 241.0 11/02/2022   Lab Results  Component Value Date   NA 140  11/06/2022   K 4.7 11/06/2022   CO2 30 11/06/2022   GLUCOSE 97 11/06/2022   BUN 18 11/06/2022   CREATININE 0.87 11/06/2022   BILITOT 0.4 11/06/2022   ALKPHOS 56 11/06/2022   AST 13 11/06/2022   ALT 11 11/06/2022   PROT 6.8 11/06/2022   ALBUMIN 4.1 11/06/2022   CALCIUM 9.8 11/06/2022   ANIONGAP 8 02/28/2022   GFR 67.44 11/06/2022   Lab Results  Component Value Date   CHOL 191 11/02/2022   Lab Results  Component Value Date   HDL 65.80 11/02/2022   Lab Results  Component Value Date   LDLCALC 112 (H) 11/02/2022   Lab Results  Component Value Date   TRIG 67.0 11/02/2022   Lab Results  Component Value Date   CHOLHDL 3 11/02/2022   Lab Results  Component Value Date   HGBA1C 5.8 11/02/2022      Assessment & Plan:  Colonoscopy: Last completed on 08/19/2006 with normal results and no concerns. Repeat colonoscopy is not recommended due to patient's age.  DEXA: Last completed on 09/12/2020. The BMD measured at [facility] L1-L3 is 1.058 g/cm2 with a T-score of -0.9. This patient is considered NORMAL according to World Health Organization [facility]) criteria. Compared with the prior study on, 01/28/2017 the BMD of the total mean shows a statistically significant decrease. Repeat DEXA recommended in 2-5 years.  Mammogram: Last completed on 05/30/2022 with no mammographic evidence of malignancy. Repeat mammogram recommended in 1-2 years.  Pap Smear: Last completed on 03/19/2019 with normal results. Repeat pap smear is not recommended due to patient's age.  Advanced Directives: Encouraged patient to complete advanced care planning documents.  Healthy Lifestyle: Encouraged 6-8 hours of sleep, heart healthy diet with protein, 60-80 oz of non-alcohol/non-caffeinated fluids, and 4000-8000 steps daily.  Immunizations: Encouraged patient to consider annual COVID-19 and Influenza vaccinations. Tetanus vaccination due in 12/2022.  Labs: Blood work today will check potassium level.  Anxiety  and Depression: This is well-controlled with Fluoxetine 40 mg.  Hyperlipidemia: This is monitored with periodic blood work. Encouraged patient to eat vegetables. Problem List Items Addressed This Visit     Hyperkalemia   Relevant Orders   Comp Met (CMET) (Completed)   CBC with Differential/Platelet   Comprehensive metabolic panel   TSH   HYPERGLYCEMIA - Primary    hgba1c acceptable, minimize simple carbs. Increase exercise as tolerated.       Relevant Orders   Hemoglobin A1c   CBC  with Differential/Platelet   Comprehensive metabolic panel   TSH   Hyperlipidemia    Encourage heart healthy diet such as MIND or DASH diet, increase exercise, avoid trans fats, simple carbohydrates and processed foods, consider a krill or fish or flaxseed oil cap daily.        Relevant Orders   Lipid panel   TSH   Malignant neoplasm of upper-inner quadrant of left breast in female, estrogen receptor negative (HCC)   Relevant Orders   CBC with Differential/Platelet   Comprehensive metabolic panel   TSH   Preventative health care    Patient encouraged to maintain heart healthy diet, regular exercise, adequate sleep. Consider daily probiotics. Take medications as prescribed. Labs ordered and reviewed. MGM annually with oncology. Had iFOB August 2022 and declines colonoscopy due to prep, she will consider repeating one. Last Dexa normal in April 2022, repeat in 3-4 years. Pap 2020, repeat 2024 or 25      Depression    Stable on current meds. Refill given on Fluoxetine      Relevant Medications   FLUoxetine (PROZAC) 40 MG capsule   History of breast cancer    She continues to follow with oncology. Doing well      Meds ordered this encounter  Medications   FLUoxetine (PROZAC) 40 MG capsule    Sig: Take 1 capsule (40 mg total) by mouth daily.    Dispense:  100 capsule    Refill:  1    Do not fill til patient requests   I, Danise Edge, MD, personally preformed the services described in this  documentation.  All medical record entries made by the scribe were at my direction and in my presence.  I have reviewed the chart and discharge instructions (if applicable) and agree that the record reflects my personal performance and is accurate and complete. 11/06/2022  I,Mohammed Iqbal,acting as a scribe for Danise Edge, MD.,have documented all relevant documentation on the behalf of Danise Edge, MD,as directed by  Danise Edge, MD while in the presence of Danise Edge, MD.  Danise Edge, MD

## 2022-11-07 ENCOUNTER — Encounter: Payer: Self-pay | Admitting: Family Medicine

## 2022-11-07 LAB — COMPREHENSIVE METABOLIC PANEL
ALT: 11 U/L (ref 0–35)
AST: 13 U/L (ref 0–37)
Albumin: 4.1 g/dL (ref 3.5–5.2)
Alkaline Phosphatase: 56 U/L (ref 39–117)
BUN: 18 mg/dL (ref 6–23)
CO2: 30 mEq/L (ref 19–32)
Calcium: 9.8 mg/dL (ref 8.4–10.5)
Chloride: 104 mEq/L (ref 96–112)
Creatinine, Ser: 0.87 mg/dL (ref 0.40–1.20)
GFR: 67.44 mL/min (ref >60.00)
Glucose, Bld: 97 mg/dL (ref 70–99)
Potassium: 4.7 mEq/L (ref 3.5–5.1)
Sodium: 140 mEq/L (ref 135–145)
Total Bilirubin: 0.4 mg/dL (ref 0.2–1.2)
Total Protein: 6.8 g/dL (ref 6.0–8.3)

## 2022-11-07 NOTE — Assessment & Plan Note (Signed)
Stable on current meds. Refill given on Fluoxetine

## 2022-11-07 NOTE — Assessment & Plan Note (Signed)
She continues to follow with oncology. Doing well

## 2023-02-27 ENCOUNTER — Encounter: Payer: Self-pay | Admitting: Hematology

## 2023-02-27 ENCOUNTER — Other Ambulatory Visit: Payer: Self-pay

## 2023-02-27 DIAGNOSIS — C50212 Malignant neoplasm of upper-inner quadrant of left female breast: Secondary | ICD-10-CM

## 2023-02-28 ENCOUNTER — Inpatient Hospital Stay: Payer: PPO

## 2023-02-28 ENCOUNTER — Inpatient Hospital Stay: Payer: PPO | Attending: Hematology | Admitting: Hematology

## 2023-02-28 VITALS — BP 118/63 | HR 80 | Temp 98.3°F | Resp 18 | Ht 64.0 in | Wt 152.1 lb

## 2023-02-28 DIAGNOSIS — Z923 Personal history of irradiation: Secondary | ICD-10-CM | POA: Diagnosis not present

## 2023-02-28 DIAGNOSIS — C50212 Malignant neoplasm of upper-inner quadrant of left female breast: Secondary | ICD-10-CM

## 2023-02-28 DIAGNOSIS — Z9221 Personal history of antineoplastic chemotherapy: Secondary | ICD-10-CM | POA: Insufficient documentation

## 2023-02-28 DIAGNOSIS — Z171 Estrogen receptor negative status [ER-]: Secondary | ICD-10-CM | POA: Diagnosis not present

## 2023-02-28 DIAGNOSIS — Z853 Personal history of malignant neoplasm of breast: Secondary | ICD-10-CM | POA: Diagnosis not present

## 2023-02-28 LAB — CBC WITH DIFFERENTIAL (CANCER CENTER ONLY)
Abs Immature Granulocytes: 0.01 10*3/uL (ref 0.00–0.07)
Basophils Absolute: 0 10*3/uL (ref 0.0–0.1)
Basophils Relative: 1 %
Eosinophils Absolute: 0.1 10*3/uL (ref 0.0–0.5)
Eosinophils Relative: 2 %
HCT: 38.1 % (ref 36.0–46.0)
Hemoglobin: 12.9 g/dL (ref 12.0–15.0)
Immature Granulocytes: 0 %
Lymphocytes Relative: 41 %
Lymphs Abs: 2.2 10*3/uL (ref 0.7–4.0)
MCH: 31.9 pg (ref 26.0–34.0)
MCHC: 33.9 g/dL (ref 30.0–36.0)
MCV: 94.1 fL (ref 80.0–100.0)
Monocytes Absolute: 0.3 10*3/uL (ref 0.1–1.0)
Monocytes Relative: 6 %
Neutro Abs: 2.8 10*3/uL (ref 1.7–7.7)
Neutrophils Relative %: 50 %
Platelet Count: 211 10*3/uL (ref 150–400)
RBC: 4.05 MIL/uL (ref 3.87–5.11)
RDW: 12.8 % (ref 11.5–15.5)
WBC Count: 5.4 10*3/uL (ref 4.0–10.5)
nRBC: 0 % (ref 0.0–0.2)

## 2023-02-28 LAB — CMP (CANCER CENTER ONLY)
ALT: 10 U/L (ref 0–44)
AST: 12 U/L — ABNORMAL LOW (ref 15–41)
Albumin: 4.2 g/dL (ref 3.5–5.0)
Alkaline Phosphatase: 53 U/L (ref 38–126)
Anion gap: 4 — ABNORMAL LOW (ref 5–15)
BUN: 18 mg/dL (ref 8–23)
CO2: 31 mmol/L (ref 22–32)
Calcium: 9.5 mg/dL (ref 8.9–10.3)
Chloride: 104 mmol/L (ref 98–111)
Creatinine: 0.78 mg/dL (ref 0.44–1.00)
GFR, Estimated: >60 mL/min (ref >60)
Glucose, Bld: 105 mg/dL — ABNORMAL HIGH (ref 70–99)
Potassium: 4.2 mmol/L (ref 3.5–5.1)
Sodium: 139 mmol/L (ref 135–145)
Total Bilirubin: 0.4 mg/dL (ref 0.3–1.2)
Total Protein: 7 g/dL (ref 6.5–8.1)

## 2023-02-28 NOTE — Progress Notes (Signed)
[facility]   [phone] [phone]   Clinic Follow up Note   Patient Care Team: Bradd Canary, MD as PCP - General (Family Medicine) Eda Paschal, Rande Brunt, MD (Inactive) (Obstetrics and Gynecology) Donnelly Angelica, RN as Oncology Nurse Navigator Pershing Proud, RN as Oncology Nurse Navigator Harriette Bouillon, MD as Consulting Physician (General Surgery) Malachy Mood, MD as Consulting Physician (Hematology) Antony Blackbird, MD as Consulting Physician (Radiation Oncology)  Date of Service:  02/28/2023  CHIEF COMPLAINT: f/u of left breast cancer   CURRENT THERAPY:  Cancer surveillance  ASSESSMENT:  Cindy [name] is a 72 y.o. 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  -S/p adjuvant AC-T from 04/08/19 - 09/14/19 and adjuvant radiation per Dr. Roselind Messier 10/07/19 - 11/04/19. -Patient reports a tender lump in the left breast, the same breast that had surgery. On examination, no abnormal lumps were detected, and the area of concern was identified as a rib. -Continue self-breast examinations and report any changes. -Keep scheduled mammogram appointment on June 03, 2023.  Bone Health Last bone density scan was in April 2022 and was normal. Patient takes Vitamin D3 daily and has adequate calcium intake. -Plan for next bone density scan in 2025.  Follow-up Patient is 4 years post breast cancer diagnosis. Discussed the possibility of cancer recurrence is very low now -Schedule annual follow-up visits for up to 10 years post diagnosis, per patient preference.        SUMMARY OF ONCOLOGIC HISTORY: Oncology History Overview Note  Cancer Staging Malignant neoplasm of upper-inner quadrant of left breast in female, estrogen receptor negative (HCC) Staging form:  Breast, AJCC 8th Edition - Clinical stage from 02/06/2019: Stage IB (cT1c, cN0, cM0, G2, ER-, PR-, HER2-) - Signed by Malachy Mood, MD on 02/17/2019 - Pathologic stage from 03/10/2019: Stage IIA (pT2, pN0, cM0, G3, ER-, PR-, HER2-) - Signed by Malachy Mood, MD on 03/12/2019    Malignant neoplasm of upper-inner quadrant of left breast in female, estrogen receptor negative (HCC)  02/04/2019 Mammogram   Diagnostic mammogram 02/04/19  IMPRESSION: Highly suspicious 1.7 x1.5 x1.7cm at 10:00 position of UPPER INNER LEFT breast mass 10 cm from nipple corresponding to the screening study finding. Tissue sampling recommended.   No abnormal LEFT axillary lymph nodes.   02/06/2019 Cancer Staging   Staging form: Breast, AJCC 8th Edition - Clinical stage from 02/06/2019: Stage IB (cT1c, cN0, cM0, G2, ER-, PR-, HER2-) - Signed by Malachy Mood, MD on 02/17/2019   02/06/2019 Initial Biopsy   Diagnosis 02/06/19 Breast, left, needle core biopsy, 10 o'clock, 10cm/n, ribbon clip - INVASIVE MAMMARY CARCINOMA.   02/06/2019 Receptors her2   Results: IMMUNOHISTOCHEMICAL AND MORPHOMETRIC ANALYSIS PERFORMED MANUALLY The tumor cells are NEGATIVE for Her2 (1+). Estrogen Receptor: 0%, NEGATIVE Progesterone Receptor: 0%, NEGATIVE Proliferation Marker Ki67: 40%   02/17/2019 Initial Diagnosis   Malignant neoplasm of upper-inner quadrant of left breast in female, estrogen receptor negative (HCC)   03/10/2019 Cancer Staging   Staging form: Breast, AJCC 8th Edition - Pathologic stage from 03/10/2019: Stage IIA (pT2, pN0, cM0, G3, ER-, PR-, HER2-) - Signed by Malachy Mood, MD on 03/12/2019   03/10/2019 Surgery   LEFT BREAST LUMPECTOMY WITH RADIOACTIVE SEED AND LEFT SENTINEL LYMPH NODE MAPPING and PAC placement  by Dr. Luisa Hart  03/10/19   03/10/2019 Pathology Results  DIAGNOSIS: 03/10/19  A. BREAST, LEFT, LUMPECTOMY:  - Invasive ductal carcinoma, grade 3, 2.5 cm  - Carcinoma is 0.3 cm from superior margin  - Medial resection edge is 0.1  cm from carcinoma (final medial margin is  represented by part 2 which is negative for carcinoma)  - Negative for lymphovascular or perineural invasion  - Biopsy site changes  - See oncology table   B. BREAST, LEFT MEDIAL MARGIN, EXCISION:  - Benign breast parenchyma, negative for carcinoma   C. LYMPH NODE, LEFT AXILLARY, SENTINEL BIOPSY:  - Lymph node, negative for carcinoma (0/1)    04/06/2019 Imaging   Bone scan IMPRESSION: Mild focus of uptake is seen involving the anterior portion of left upper rib, but there is no corresponding abnormality seen on the CT scan of the same day. Potentially this may represent degenerative change or secondary to overlying postsurgical change. Attention on follow-up is recommended.   No definite scintigraphic evidence of osseous metastases.   04/06/2019 Imaging   CT CAP IMPRESSION: 1. Surgical changes left breast with borderline enlarged left axillary lymph node. No other definite evidence for metastatic disease in the chest, abdomen, or pelvis. 2. Tiny bilateral pulmonary nodules. Attention on follow-up recommended. 3. Scattered small hypoattenuating lesions in the liver parenchyma. Some of these have attenuation higher than would be expected for simple fluid and may be cysts complicated by proteinaceous debris or hemorrhage. MRI without and with contrast recommended to further evaluate. 4. Small to moderate hiatal hernia. 5. Aortic Atherosclerosis (ICD10-I70.0).   04/08/2019 - 09/14/2019 Chemotherapy   chemo AC q2weeks for 4 cycles starting 04/08/19-05/20/19 followed by Taxol weekly for 12 weeks starting 06/09/19.     09/16/2019 Imaging   CT CAP w contrast IMPRESSION: 1. No evidence for metastatic disease in the chest, abdomen, or pelvis. 2. No change in the tiny bilateral pulmonary nodules, likely benign. Attention on follow-up suggested. 3. Stable appearance of multiple hypoattenuating liver lesions. None of these lesions show  suspicious enhancement on today's study in there probably a combination of simple and complex cysts. There are several tiny liver lesions too small to characterize on today's study. Continued attention on follow-up recommended. 4. Small hiatal hernia. 5.  Aortic Atherosclerois (ICD10-170.0)   10/07/2019 - 11/04/2019 Radiation Therapy   Adjuvant RT with Dr Roselind Messier 10/07/19-11/04/19   11/25/2019 Procedure   PAC removed by Dr Luisa Hart      INTERVAL HISTORY:  Discussed the use of AI scribe software for clinical note transcription with the patient, who gave verbal consent to proceed.  The patient, with a history of breast cancer, presents with a new, tender lump in the left breast. They have a history of fibrocystic breasts and have always had tenderness, but since their cancer diagnosis, they have noticed increased tenderness in the left breast. They deny any new pain in the back, spine, sternum, ribcage, or hip area. They report a good appetite and have gained weight since their last visit. They have a mammogram scheduled for December.         All other systems were reviewed with the patient and are negative.  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 if bipolar works the best   Elevated fasting blood sugar    hx   GERD (  gastroesophageal reflux disease)    Several years ago   H/O measles    Headache    Hiatal hernia    History of chicken pox    Hx of migraines    Microscopic hematuria    advised to see urology ? never went   Personal history of chemotherapy    Personal history of radiation therapy    Preventative health care 06/20/2011   Radiation dermatitis    chest    SURGICAL HISTORY: Past Surgical History:  Procedure Laterality Date   BREAST BIOPSY Left 01/2019   BREAST LUMPECTOMY Left  02/2019   BREAST LUMPECTOMY WITH RADIOACTIVE SEED AND SENTINEL LYMPH NODE BIOPSY Left 03/10/2019   Procedure: LEFT BREAST LUMPECTOMY WITH RADIOACTIVE SEED AND LEFT SENTINEL LYMPH NODE MAPPING;  Surgeon: Harriette Bouillon, MD;  Location: [facility] OR;  Service: General;  Laterality: Left;   endometrial polyps     2 surgeries by Dr Carrolyn Leigh   EYE SURGERY  04/29/2009   Rt eye tighten up   PORT-A-CATH REMOVAL Right 11/25/2019   Procedure: PORT REMOVAL;  Surgeon: Harriette Bouillon, MD;  Location: [facility];  Service: General;  Laterality: Right;   PORTACATH PLACEMENT N/A 03/10/2019   Procedure: INSERTION PORT-A-CATH WITH ULTRASOUND;  Surgeon: Harriette Bouillon, MD;  Location: [facility] OR;  Service: General;  Laterality: N/A;   steel plate in skull Left    from a crushed skull    I have reviewed the social history and family history with the patient and they are unchanged from previous note.  ALLERGIES:  is allergic to bromfed and venlafaxine.  MEDICATIONS:  Current Outpatient Medications  Medication Sig Dispense Refill   Ascorbic Acid (VITAMIN C PO) Take by mouth.     BIOTIN PO Take by mouth.     cholecalciferol (VITAMIN D3) 25 MCG (1000 UNIT) tablet Take 1,000 Units by mouth daily.     FLUoxetine (PROZAC) 40 MG capsule Take 1 capsule (40 mg total) by mouth daily. 100 capsule 1   KRILL OIL PO Take by mouth.     Multiple Vitamin (MULTIVITAMIN WITH MINERALS) TABS tablet Take 1 tablet by mouth daily.     VITAMIN E PO Take by mouth.     No current facility-administered medications for this visit.    PHYSICAL EXAMINATION: ECOG PERFORMANCE STATUS: 0 - Asymptomatic  Vitals:   02/28/23 1342  BP: 118/63  Pulse: 80  Resp: 18  Temp: 98.3 F (36.8 C)  SpO2: 98%   Wt Readings from Last 3 Encounters:  02/28/23 152 lb 1.6 oz (69 kg)  11/06/22 148 lb (67.1 kg)  05/14/22 153 lb 6.4 oz (69.6 kg)     GENERAL:alert, no distress and comfortable SKIN: skin color, texture, turgor are normal,  no rashes or significant lesions EYES: normal, Conjunctiva are pink and non-injected, sclera clear NECK: supple, thyroid normal size, non-tender, without nodularity LYMPH:  no palpable lymphadenopathy in the cervical, axillary  LUNGS: clear to auscultation and percussion with normal breathing effort HEART: regular rate & rhythm and no murmurs and no lower extremity edema ABDOMEN:abdomen soft, non-tender and normal bowel sounds Musculoskeletal:no cyanosis of digits and no clubbing  NEURO: alert & oriented x 3 with fluent speech, no focal motor/sensory deficits Breasts: Breast inspection showed them to be symmetrical with no nipple discharge. Palpation of the breasts and axilla revealed no obvious mass that I could appreciate.  Special attention paid to the area of her concern in the upper inner quadrant of left breast, I did  not feel any abnormality.      LABORATORY DATA:  I have reviewed the data as listed    Latest Ref Rng & Units 02/28/2023    1:10 PM 11/02/2022   10:34 AM 05/10/2022   11:06 AM  CBC  WBC 4.0 - 10.5 K/uL 5.4  5.6  5.3   Hemoglobin 12.0 - 15.0 g/dL 16.1  09.6  04.5   Hematocrit 36.0 - 46.0 % 38.1  38.9  40.3   Platelets 150 - 400 K/uL 211  241.0  244.0         Latest Ref Rng & Units 02/28/2023    1:10 PM 11/06/2022    2:20 PM 11/02/2022   10:34 AM  CMP  Glucose 70 - 99 mg/dL 409  97  811   BUN 8 - 23 mg/dL 18  18  20    Creatinine 0.44 - 1.00 mg/dL 9.14  7.82  9.56   Sodium 135 - 145 mmol/L 139  140  142   Potassium 3.5 - 5.1 mmol/L 4.2  4.7  5.8 No hemolysis seen   Chloride 98 - 111 mmol/L 104  104  105   CO2 22 - 32 mmol/L 31  30  29    Calcium 8.9 - 10.3 mg/dL 9.5  9.8  9.4   Total Protein 6.5 - 8.1 g/dL 7.0  6.8  6.5   Total Bilirubin 0.3 - 1.2 mg/dL 0.4  0.4  0.5   Alkaline Phos 38 - 126 U/L 53  56  54   AST 15 - 41 U/L 12  13  13    ALT 0 - 44 U/L 10  11  11        RADIOGRAPHIC STUDIES: I have personally reviewed the radiological images as listed and  agreed with the findings in the report. No results found.    No orders of the defined types were placed in this encounter.  All questions were answered. The patient knows to call the clinic with any problems, questions or concerns. No barriers to learning was detected. The total time spent in the appointment was 20 minutes.     Malachy Mood, MD 02/28/2023

## 2023-02-28 NOTE — Assessment & Plan Note (Signed)
Stage IIA, p(T2N0M0), triple negative, Grade III -diagnosed in 02/2019, s/p left breast lumpectomy with SLNB on 03/10/19.  -S/p adjuvant AC-T from 04/08/19 - 09/14/19 and adjuvant radiation per Dr. Roselind Messier 10/07/19 - 11/04/19.

## 2023-03-01 ENCOUNTER — Encounter: Payer: Self-pay | Admitting: Hematology

## 2023-03-05 ENCOUNTER — Other Ambulatory Visit: Payer: PPO

## 2023-03-12 ENCOUNTER — Ambulatory Visit: Payer: PPO | Admitting: Family Medicine

## 2023-03-22 ENCOUNTER — Encounter: Payer: Self-pay | Admitting: Hematology

## 2023-04-07 ENCOUNTER — Encounter: Payer: Self-pay | Admitting: Hematology

## 2023-05-02 ENCOUNTER — Encounter: Payer: Self-pay | Admitting: Hematology

## 2023-05-02 NOTE — Telephone Encounter (Signed)
Telephone call  

## 2023-05-07 ENCOUNTER — Other Ambulatory Visit (INDEPENDENT_AMBULATORY_CARE_PROVIDER_SITE_OTHER): Payer: PPO

## 2023-05-07 DIAGNOSIS — E875 Hyperkalemia: Secondary | ICD-10-CM

## 2023-05-07 DIAGNOSIS — E785 Hyperlipidemia, unspecified: Secondary | ICD-10-CM | POA: Diagnosis not present

## 2023-05-07 DIAGNOSIS — Z171 Estrogen receptor negative status [ER-]: Secondary | ICD-10-CM | POA: Diagnosis not present

## 2023-05-07 DIAGNOSIS — R7309 Other abnormal glucose: Secondary | ICD-10-CM | POA: Diagnosis not present

## 2023-05-07 DIAGNOSIS — C50212 Malignant neoplasm of upper-inner quadrant of left female breast: Secondary | ICD-10-CM

## 2023-05-07 LAB — CBC WITH DIFFERENTIAL/PLATELET
Basophils Absolute: 0 10*3/uL (ref 0.0–0.1)
Basophils Relative: 0.5 % (ref 0.0–3.0)
Eosinophils Absolute: 0.1 10*3/uL (ref 0.0–0.7)
Eosinophils Relative: 1.9 % (ref 0.0–5.0)
HCT: 41.6 % (ref 36.0–46.0)
Hemoglobin: 13.7 g/dL (ref 12.0–15.0)
Lymphocytes Relative: 41.5 % (ref 12.0–46.0)
Lymphs Abs: 2.2 10*3/uL (ref 0.7–4.0)
MCHC: 32.9 g/dL (ref 30.0–36.0)
MCV: 97.5 fL (ref 78.0–100.0)
Monocytes Absolute: 0.4 10*3/uL (ref 0.1–1.0)
Monocytes Relative: 6.8 % (ref 3.0–12.0)
Neutro Abs: 2.6 10*3/uL (ref 1.4–7.7)
Neutrophils Relative %: 49.3 % (ref 43.0–77.0)
Platelets: 231 10*3/uL (ref 150.0–400.0)
RBC: 4.27 Mil/uL (ref 3.87–5.11)
RDW: 13.8 % (ref 11.5–15.5)
WBC: 5.2 10*3/uL (ref 4.0–10.5)

## 2023-05-07 LAB — COMPREHENSIVE METABOLIC PANEL
ALT: 9 U/L (ref 0–35)
AST: 10 U/L (ref 0–37)
Albumin: 4.2 g/dL (ref 3.5–5.2)
Alkaline Phosphatase: 56 U/L (ref 39–117)
BUN: 18 mg/dL (ref 6–23)
CO2: 31 meq/L (ref 19–32)
Calcium: 9.3 mg/dL (ref 8.4–10.5)
Chloride: 105 meq/L (ref 96–112)
Creatinine, Ser: 0.78 mg/dL (ref 0.40–1.20)
GFR: 76.62 mL/min (ref >60.00)
Glucose, Bld: 112 mg/dL — ABNORMAL HIGH (ref 70–99)
Potassium: 5.3 meq/L — ABNORMAL HIGH (ref 3.5–5.1)
Sodium: 141 meq/L (ref 135–145)
Total Bilirubin: 0.5 mg/dL (ref 0.2–1.2)
Total Protein: 6.5 g/dL (ref 6.0–8.3)

## 2023-05-07 LAB — LIPID PANEL
Cholesterol: 192 mg/dL (ref 0–200)
HDL: 61.4 mg/dL (ref >39.00)
LDL Cholesterol: 117 mg/dL — ABNORMAL HIGH (ref 0–99)
NonHDL: 130.5
Total CHOL/HDL Ratio: 3
Triglycerides: 67 mg/dL (ref 0.0–149.0)
VLDL: 13.4 mg/dL (ref 0.0–40.0)

## 2023-05-07 LAB — HEMOGLOBIN A1C: Hgb A1c MFr Bld: 5.9 % (ref 4.6–6.5)

## 2023-05-07 LAB — TSH: TSH: 1.33 u[IU]/mL (ref 0.35–5.50)

## 2023-05-12 NOTE — Assessment & Plan Note (Signed)
hgba1c acceptable, minimize simple carbs. Increase exercise as tolerated.  

## 2023-05-12 NOTE — Assessment & Plan Note (Signed)
Encourage heart healthy diet such as MIND or DASH diet, increase exercise, avoid trans fats, simple carbohydrates and processed foods, consider a krill or fish or flaxseed oil cap daily.  °

## 2023-05-12 NOTE — Assessment & Plan Note (Signed)
Stable on Fluoxetine 

## 2023-05-14 ENCOUNTER — Encounter: Payer: Self-pay | Admitting: Hematology

## 2023-05-14 ENCOUNTER — Other Ambulatory Visit (HOSPITAL_BASED_OUTPATIENT_CLINIC_OR_DEPARTMENT_OTHER): Payer: Self-pay

## 2023-05-14 ENCOUNTER — Ambulatory Visit (INDEPENDENT_AMBULATORY_CARE_PROVIDER_SITE_OTHER): Payer: PPO | Admitting: Family Medicine

## 2023-05-14 VITALS — BP 92/60 | HR 84 | Temp 98.2°F | Resp 18 | Ht 64.0 in | Wt 152.2 lb

## 2023-05-14 DIAGNOSIS — R7309 Other abnormal glucose: Secondary | ICD-10-CM | POA: Diagnosis not present

## 2023-05-14 DIAGNOSIS — F32A Depression, unspecified: Secondary | ICD-10-CM

## 2023-05-14 DIAGNOSIS — E785 Hyperlipidemia, unspecified: Secondary | ICD-10-CM | POA: Diagnosis not present

## 2023-05-14 DIAGNOSIS — E875 Hyperkalemia: Secondary | ICD-10-CM | POA: Diagnosis not present

## 2023-05-14 MED ORDER — BOOSTRIX 5-2.5-18.5 LF-MCG/0.5 IM SUSY
0.5000 mL | PREFILLED_SYRINGE | Freq: Once | INTRAMUSCULAR | 0 refills | Status: AC
  Filled 2023-05-14: qty 0.5, 1d supply, fill #0

## 2023-05-14 MED ORDER — FLUOXETINE HCL 40 MG PO CAPS: 40.0000 mg | ORAL_CAPSULE | Freq: Every day | ORAL | 3 refills | Status: DC

## 2023-05-14 NOTE — Patient Instructions (Signed)
Tetanus  is  due

## 2023-05-15 ENCOUNTER — Encounter: Payer: Self-pay | Admitting: Family Medicine

## 2023-05-15 LAB — COMPREHENSIVE METABOLIC PANEL
ALT: 10 U/L (ref 0–35)
AST: 12 U/L (ref 0–37)
Albumin: 4.4 g/dL (ref 3.5–5.2)
Alkaline Phosphatase: 59 U/L (ref 39–117)
BUN: 23 mg/dL (ref 6–23)
CO2: 31 meq/L (ref 19–32)
Calcium: 9.8 mg/dL (ref 8.4–10.5)
Chloride: 102 meq/L (ref 96–112)
Creatinine, Ser: 0.75 mg/dL (ref 0.40–1.20)
GFR: 80.3 mL/min (ref >60.00)
Glucose, Bld: 102 mg/dL — ABNORMAL HIGH (ref 70–99)
Potassium: 4.6 meq/L (ref 3.5–5.1)
Sodium: 140 meq/L (ref 135–145)
Total Bilirubin: 0.3 mg/dL (ref 0.2–1.2)
Total Protein: 7.1 g/dL (ref 6.0–8.3)

## 2023-05-15 NOTE — Progress Notes (Addendum)
Subjective:    Patient ID: Cindy [name], female    DOB: 1950-12-11, 72 y.o.   MRN: [id number]  Chief Complaint  Patient presents with   Follow-up    HPI Discussed the use of AI scribe software for clinical note transcription with the patient, who gave verbal consent to proceed.  HPI   Patient is a 72 year old female in today for follow up on chronic medical concerns. No c/o recent hospitalizations or febrile illness. She has been under a tremendous amount of stress and has been managing it wht Ashwagandha for the past 3 weeks and reports some cognitive improvements, although not necessarily stress reduction. She also mentions a recent increase in her potassium levels, which she attributes to her diet. The patient has been compliant with her Fluoxetine 40 mg medicaiton. Her stressors revolve around politics and family conflicts. 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 if bipolar works the best   Elevated fasting blood sugar    hx   GERD (gastroesophageal reflux disease)    Several years ago   H/O measles    Headache    Hiatal hernia    History of chicken pox    Hx of migraines    Microscopic hematuria    advised to see urology ? never went   Personal history of chemotherapy    Personal history of radiation therapy    Preventative health care 06/20/2011   Radiation dermatitis    chest    Past Surgical History:  Procedure Laterality Date   BREAST BIOPSY Left 01/2019   BREAST LUMPECTOMY Left 02/2019   BREAST LUMPECTOMY WITH RADIOACTIVE SEED AND SENTINEL LYMPH NODE BIOPSY Left 03/10/2019   Procedure: LEFT BREAST LUMPECTOMY WITH RADIOACTIVE SEED AND LEFT SENTINEL LYMPH NODE MAPPING;  Surgeon: Harriette Bouillon, MD;  Location: [facility];   Service: General;  Laterality: Left;   endometrial polyps     2 surgeries by Dr Carrolyn Leigh   EYE SURGERY  04/29/2009   Rt eye tighten up   PORT-A-CATH REMOVAL Right 11/25/2019   Procedure: PORT REMOVAL;  Surgeon: Harriette Bouillon, MD;  Location: [facility];  Service: General;  Laterality: Right;   PORTACATH PLACEMENT N/A 03/10/2019   Procedure: INSERTION PORT-A-CATH WITH ULTRASOUND;  Surgeon: Harriette Bouillon, MD;  Location: [facility];  Service: General;  Laterality: N/A;   steel plate in skull Left    from a crushed skull    Family History  Problem Relation Age of Onset   Alzheimer's disease Mother    Cancer Mother 19       colon   Thyroid disease Father    COPD Father    Cancer Father        bladder with cystectomy   Cancer Maternal Grandfather        colon cancer   Cancer Other        maternal grant granmother had breast cancer     Social History   Socioeconomic History   Marital status: Single    Spouse name: Not on file   Number of children: Not on file   Years of education: Not on file   Highest education level: Bachelor's degree (e.g.,  BA, AB, BS)  Occupational History   Occupation: retired   Tobacco Use   Smoking status: Former    Current packs/day: 0.00    Types: Cigarettes    Quit date: 11/23/1981    Years since quitting: 41.5   Smokeless tobacco: Never   Tobacco comments:    social smoker in her 20-30's   Vaping Use   Vaping status: Never Used  Substance and Sexual Activity   Alcohol use: Yes    Alcohol/week: 1.0 - 2.0 standard drink of alcohol    Types: 1 - 2 Glasses of wine per week    Comment: very rare on social occasions   Drug use: No   Sexual activity: Not Currently    Birth control/protection: None  Other Topics Concern   Not on file  Social History Narrative   HH of 1   2 Cats   Caffeine 2 mugs   Exercise recently some, no major dietary restrictions at this time. Was vegan now eats chicken and poultry   Works at [employer] in  [place] [employer]: Low Risk  (05/07/2023)   Overall Financial Resource Strain (CARDIA)    Difficulty of Paying Living Expenses: Not hard at all  Food Insecurity: No Food Insecurity (05/07/2023)   Hunger Vital Sign    Worried About Running Out of Food in the Last Year: Never true    Ran Out of Food in the Last Year: Never true  Transportation Needs: No Transportation Needs (05/07/2023)   PRAPARE - Administrator, Civil Service (Medical): No    Lack of Transportation (Non-Medical): No  Physical Activity: Insufficiently Active (05/07/2023)   Exercise Vital Sign    Days of Exercise per Week: 1 day    Minutes of Exercise per Session: 10 min  Stress: No Stress Concern Present (05/07/2023)   [employer] of Occupational Health - Occupational Stress Questionnaire    Feeling of Stress : Only a little  Social Connections: Socially Isolated (05/07/2023)   Social Connection and Isolation Panel [NHANES]    Frequency of Communication with Friends and Family: Once a week    Frequency of Social Gatherings with Friends and Family: Once a week    Attends Religious Services: Never    Database administrator or Organizations: No    Attends Engineer, structural: Not on file    Marital Status: Never married  Intimate Partner Violence: Not on file    Outpatient Medications Prior to Visit  Medication Sig Dispense Refill   Ascorbic Acid (VITAMIN C PO) Take by mouth.     BIOTIN PO Take by mouth.     cholecalciferol (VITAMIN D3) 25 MCG (1000 UNIT) tablet Take 1,000 Units by mouth daily.     KRILL OIL PO Take by mouth.     Multiple Vitamin (MULTIVITAMIN WITH MINERALS) TABS tablet Take 1 tablet by mouth daily.     FLUoxetine (PROZAC) 40 MG capsule Take 1 capsule (40 mg total) by mouth daily. 100 capsule 1   VITAMIN E PO Take by mouth.     No facility-administered medications prior to visit.    Allergies  Allergen Reactions    Bromfed Shortness Of Breath   Venlafaxine     Doesn't remember reaction    Review of Systems  Constitutional:  Negative for fever and malaise/fatigue.  HENT:  Negative for congestion.   Eyes:  Negative for blurred vision.  Respiratory:  Negative for  shortness of breath.   Cardiovascular:  Negative for chest pain, palpitations and leg swelling.  Gastrointestinal:  Negative for abdominal pain, blood in stool and nausea.  Genitourinary:  Negative for dysuria and frequency.  Musculoskeletal:  Negative for falls.  Skin:  Negative for rash.  Neurological:  Negative for dizziness, loss of consciousness and headaches.  Endo/Heme/Allergies:  Negative for environmental allergies.  Psychiatric/Behavioral:  Negative for depression. The patient is not nervous/anxious.       Objective:    Physical Exam Constitutional:      General: She is not in acute distress.    Appearance: Normal appearance. She is well-developed. She is not toxic-appearing.  HENT:     Head: Normocephalic and atraumatic.     Right Ear: External ear normal.     Left Ear: External ear normal.     Nose: Nose normal.  Eyes:     General:        Right eye: No discharge.        Left eye: No discharge.     Conjunctiva/sclera: Conjunctivae normal.  Neck:     Thyroid: No thyromegaly.  Cardiovascular:     Rate and Rhythm: Normal rate and regular rhythm.     Heart sounds: Normal heart sounds. No murmur heard. Pulmonary:     Effort: Pulmonary effort is normal. No respiratory distress.     Breath sounds: Normal breath sounds.  Abdominal:     General: Bowel sounds are normal.     Palpations: Abdomen is soft.     Tenderness: There is no abdominal tenderness. There is no guarding.  Musculoskeletal:        General: Normal range of motion.     Cervical back: Neck supple.  Lymphadenopathy:     Cervical: No cervical adenopathy.  Skin:    General: Skin is warm and dry.  Neurological:     Mental Status: She is alert and  oriented to person, place, and time.  Psychiatric:        Mood and Affect: Mood normal.        Behavior: Behavior normal.        Thought Content: Thought content normal.        Judgment: Judgment normal.   BP 92/60 (BP Location: Right Arm, Patient Position: Sitting, Cuff Size: Normal)   Pulse 84   Temp 98.2 F (36.8 C) (Oral)   Resp 18   Ht 5\' 4"  (1.626 m)   Wt 152 lb 3.2 oz (69 kg)   SpO2 95%   BMI 26.13 kg/m  Wt Readings from Last 3 Encounters:  05/14/23 152 lb 3.2 oz (69 kg)  02/28/23 152 lb 1.6 oz (69 kg)  11/06/22 148 lb (67.1 kg)    Diabetic Foot Exam - Simple   No data filed    Lab Results  Component Value Date   WBC 5.2 05/07/2023   HGB 13.7 05/07/2023   HCT 41.6 05/07/2023   PLT 231.0 05/07/2023   GLUCOSE 102 (H) 05/14/2023   CHOL 192 05/07/2023   TRIG 67.0 05/07/2023   HDL 61.40 05/07/2023   LDLDIRECT 123.1 06/25/2011   LDLCALC 117 (H) 05/07/2023   ALT 10 05/14/2023   AST 12 05/14/2023   NA 140 05/14/2023   K 4.6 05/14/2023   CL 102 05/14/2023   CREATININE 0.75 05/14/2023   BUN 23 05/14/2023   CO2 31 05/14/2023   TSH 1.33 05/07/2023   HGBA1C 5.9 05/07/2023    Lab Results  Component Value Date  TSH 1.33 05/07/2023   Lab Results  Component Value Date   WBC 5.2 05/07/2023   HGB 13.7 05/07/2023   HCT 41.6 05/07/2023   MCV 97.5 05/07/2023   PLT 231.0 05/07/2023   Lab Results  Component Value Date   NA 140 05/14/2023   K 4.6 05/14/2023   CO2 31 05/14/2023   GLUCOSE 102 (H) 05/14/2023   BUN 23 05/14/2023   CREATININE 0.75 05/14/2023   BILITOT 0.3 05/14/2023   ALKPHOS 59 05/14/2023   AST 12 05/14/2023   ALT 10 05/14/2023   PROT 7.1 05/14/2023   ALBUMIN 4.4 05/14/2023   CALCIUM 9.8 05/14/2023   ANIONGAP 4 (L) 02/28/2023   GFR 80.30 05/14/2023   Lab Results  Component Value Date   CHOL 192 05/07/2023   Lab Results  Component Value Date   HDL 61.40 05/07/2023   Lab Results  Component Value Date   LDLCALC 117 (H) 05/07/2023    Lab Results  Component Value Date   TRIG 67.0 05/07/2023   Lab Results  Component Value Date   CHOLHDL 3 05/07/2023   Lab Results  Component Value Date   HGBA1C 5.9 05/07/2023       Assessment & Plan:  HYPERGLYCEMIA Assessment & Plan: hgba1c acceptable, minimize simple carbs. Increase exercise as tolerated.    Hyperlipidemia, unspecified hyperlipidemia type Assessment & Plan: Encourage heart healthy diet such as MIND or DASH diet, increase exercise, avoid trans fats, simple carbohydrates and processed foods, consider a krill or fish or flaxseed oil cap daily.     Depression, unspecified depression type Assessment & Plan: Stable on Fluoxetine   Hyperkalemia -     Comprehensive metabolic panel  Other orders -     FLUoxetine HCl; Take 1 capsule (40 mg total) by mouth daily.  Dispense: 100 capsule; Refill: 3    Assessment and Plan    Stress and Cognitive Function Patient reports improved focus and cognitive function after starting Ashwagandha for stress. Discussed the potential mechanism of action and the possible benefits of reducing stress hormones on cognitive function. -Continue Ashwagandha as tolerated.  Hyperkalemia Elevated potassium level on recent lab work. Discussed potential causes including diet and possible effects of Ashwagandha. Kidney function appears normal. -Repeat potassium level today. -Advise patient to hydrate and monitor diet for high potassium foods.  Immunizations Tetanus vaccine due. -Advise patient to receive Tetanus vaccine at pharmacy today.  Colon Cancer Screening Family history of colon cancer. Last colonoscopy in 2008. Patient has been completing annual stool tests for blood. -Provide patient with stool test kit for annual screening. -Consider referral for colonoscopy if stool test is positive for blood.  Depression Stable on Fluoxetine 40mg . -Refill Fluoxetine 40mg , 100 tablets with 1 refill.  General Health Maintenance /  Followup Plans -Consider annual wellness visit for overall health maintenance. -Schedule follow-up visit in 6 months or sooner if needed.         Danise Edge, MD

## 2023-05-29 ENCOUNTER — Encounter: Payer: Self-pay | Admitting: Family Medicine

## 2023-06-03 ENCOUNTER — Ambulatory Visit
Admission: RE | Admit: 2023-06-03 | Discharge: 2023-06-03 | Disposition: A | Discharge status: Home / Self Care | Payer: PPO | Source: Ambulatory Visit | Attending: Hematology | Admitting: Hematology

## 2023-06-03 DIAGNOSIS — Z1231 Encounter for screening mammogram for malignant neoplasm of breast: Secondary | ICD-10-CM

## 2023-06-17 ENCOUNTER — Other Ambulatory Visit: Payer: Self-pay | Admitting: Hematology

## 2023-06-17 ENCOUNTER — Other Ambulatory Visit: Payer: Self-pay

## 2023-06-17 DIAGNOSIS — C50212 Malignant neoplasm of upper-inner quadrant of left female breast: Secondary | ICD-10-CM

## 2023-06-19 ENCOUNTER — Encounter: Payer: Self-pay | Admitting: Hematology

## 2023-07-04 ENCOUNTER — Other Ambulatory Visit (INDEPENDENT_AMBULATORY_CARE_PROVIDER_SITE_OTHER): Payer: PPO

## 2023-07-04 DIAGNOSIS — Z1211 Encounter for screening for malignant neoplasm of colon: Secondary | ICD-10-CM | POA: Diagnosis not present

## 2023-07-05 ENCOUNTER — Encounter: Payer: Self-pay | Admitting: Family

## 2023-07-05 LAB — FECAL OCCULT BLOOD, IMMUNOCHEMICAL: Fecal Occult Bld: NEGATIVE

## 2023-07-11 ENCOUNTER — Ambulatory Visit
Admission: RE | Admit: 2023-07-11 | Discharge: 2023-07-11 | Disposition: A | Discharge status: Home / Self Care | Payer: PPO | Source: Ambulatory Visit | Attending: Hematology | Admitting: Hematology

## 2023-07-11 ENCOUNTER — Other Ambulatory Visit: Payer: Self-pay | Admitting: Hematology

## 2023-07-11 DIAGNOSIS — C50212 Malignant neoplasm of upper-inner quadrant of left female breast: Secondary | ICD-10-CM

## 2023-07-11 DIAGNOSIS — R928 Other abnormal and inconclusive findings on diagnostic imaging of breast: Secondary | ICD-10-CM | POA: Diagnosis not present

## 2023-07-11 DIAGNOSIS — N6489 Other specified disorders of breast: Secondary | ICD-10-CM

## 2023-07-15 ENCOUNTER — Ambulatory Visit
Admission: RE | Admit: 2023-07-15 | Discharge: 2023-07-15 | Disposition: A | Discharge status: Home / Self Care | Payer: PPO | Source: Ambulatory Visit | Attending: Hematology | Admitting: Hematology

## 2023-07-15 DIAGNOSIS — N6489 Other specified disorders of breast: Secondary | ICD-10-CM

## 2023-07-15 DIAGNOSIS — R928 Other abnormal and inconclusive findings on diagnostic imaging of breast: Secondary | ICD-10-CM | POA: Diagnosis not present

## 2023-07-15 DIAGNOSIS — N6031 Fibrosclerosis of right breast: Secondary | ICD-10-CM | POA: Diagnosis not present

## 2023-07-15 HISTORY — PX: BREAST BIOPSY: SHX20

## 2023-07-16 LAB — SURGICAL PATHOLOGY

## 2023-08-07 DIAGNOSIS — H2513 Age-related nuclear cataract, bilateral: Secondary | ICD-10-CM | POA: Diagnosis not present

## 2023-08-07 DIAGNOSIS — H5021 Vertical strabismus, right eye: Secondary | ICD-10-CM | POA: Diagnosis not present

## 2023-08-07 DIAGNOSIS — H52223 Regular astigmatism, bilateral: Secondary | ICD-10-CM | POA: Diagnosis not present

## 2023-08-07 DIAGNOSIS — H35373 Puckering of macula, bilateral: Secondary | ICD-10-CM | POA: Diagnosis not present

## 2023-08-07 DIAGNOSIS — H524 Presbyopia: Secondary | ICD-10-CM | POA: Diagnosis not present

## 2023-08-07 DIAGNOSIS — H43813 Vitreous degeneration, bilateral: Secondary | ICD-10-CM | POA: Diagnosis not present

## 2023-12-24 ENCOUNTER — Other Ambulatory Visit: Payer: PPO

## 2023-12-30 ENCOUNTER — Encounter: Payer: PPO | Admitting: Family Medicine

## 2024-02-17 ENCOUNTER — Encounter: Payer: Self-pay | Admitting: Hematology

## 2024-02-17 NOTE — Progress Notes (Signed)
Orders placed in this encounter.

## 2024-02-27 ENCOUNTER — Other Ambulatory Visit (HOSPITAL_BASED_OUTPATIENT_CLINIC_OR_DEPARTMENT_OTHER): Payer: Self-pay

## 2024-02-27 MED ORDER — FLUZONE HIGH-DOSE 0.5 ML IM SUSY
0.5000 mL | PREFILLED_SYRINGE | Freq: Once | INTRAMUSCULAR | 0 refills | Status: AC
  Filled 2024-02-27: qty 0.5, 1d supply, fill #0

## 2024-03-03 ENCOUNTER — Telehealth: Payer: Self-pay | Admitting: Nurse Practitioner

## 2024-03-03 ENCOUNTER — Telehealth: Payer: Self-pay | Admitting: Hematology

## 2024-03-03 NOTE — Telephone Encounter (Signed)
 Pt is called and want to cancel the appts.

## 2024-03-03 NOTE — Telephone Encounter (Signed)
 LVM for pt  regarding  appt change per Dr. Lanny.

## 2024-03-05 ENCOUNTER — Ambulatory Visit: Payer: PPO | Admitting: Hematology

## 2024-03-05 ENCOUNTER — Other Ambulatory Visit

## 2024-03-05 ENCOUNTER — Ambulatory Visit: Admitting: Hematology

## 2024-03-05 ENCOUNTER — Other Ambulatory Visit: Payer: PPO

## 2024-03-10 ENCOUNTER — Other Ambulatory Visit

## 2024-03-10 ENCOUNTER — Ambulatory Visit: Admitting: Nurse Practitioner

## 2024-05-24 ENCOUNTER — Other Ambulatory Visit: Payer: Self-pay | Admitting: Family Medicine

## 2024-05-26 ENCOUNTER — Other Ambulatory Visit: Payer: Self-pay | Admitting: Family Medicine

## 2024-05-26 DIAGNOSIS — Z1231 Encounter for screening mammogram for malignant neoplasm of breast: Secondary | ICD-10-CM

## 2024-06-17 ENCOUNTER — Ambulatory Visit (HOSPITAL_BASED_OUTPATIENT_CLINIC_OR_DEPARTMENT_OTHER)
Admission: RE | Admit: 2024-06-17 | Discharge: 2024-06-17 | Disposition: A | Discharge status: Home / Self Care | Source: Ambulatory Visit | Attending: Medical | Admitting: Medical

## 2024-06-17 ENCOUNTER — Ambulatory Visit: Payer: Self-pay | Admitting: Medical

## 2024-06-17 ENCOUNTER — Ambulatory Visit: Admitting: Medical

## 2024-06-17 ENCOUNTER — Encounter: Payer: Self-pay | Admitting: Medical

## 2024-06-17 VITALS — BP 100/62 | HR 88 | Temp 98.4°F | Resp 16 | Ht 64.0 in | Wt 148.4 lb

## 2024-06-17 DIAGNOSIS — I959 Hypotension, unspecified: Secondary | ICD-10-CM

## 2024-06-17 DIAGNOSIS — R0609 Other forms of dyspnea: Secondary | ICD-10-CM | POA: Diagnosis present

## 2024-06-17 DIAGNOSIS — R42 Dizziness and giddiness: Secondary | ICD-10-CM

## 2024-06-17 DIAGNOSIS — R5383 Other fatigue: Secondary | ICD-10-CM

## 2024-06-17 LAB — CBC WITH DIFFERENTIAL/PLATELET
Basophils Absolute: 0 K/uL (ref 0.0–0.1)
Basophils Relative: 0.5 % (ref 0.0–3.0)
Eosinophils Absolute: 0.1 K/uL (ref 0.0–0.7)
Eosinophils Relative: 1.2 % (ref 0.0–5.0)
HCT: 42.2 % (ref 36.0–46.0)
Hemoglobin: 14.3 g/dL (ref 12.0–15.0)
Lymphocytes Relative: 34.6 % (ref 12.0–46.0)
Lymphs Abs: 2.1 K/uL (ref 0.7–4.0)
MCHC: 33.8 g/dL (ref 30.0–36.0)
MCV: 92.6 fl (ref 78.0–100.0)
Monocytes Absolute: 0.4 K/uL (ref 0.1–1.0)
Monocytes Relative: 6.1 % (ref 3.0–12.0)
Neutro Abs: 3.5 K/uL (ref 1.4–7.7)
Neutrophils Relative %: 57.6 % (ref 43.0–77.0)
Platelets: 237 K/uL (ref 150.0–400.0)
RBC: 4.56 Mil/uL (ref 3.87–5.11)
RDW: 13.4 % (ref 11.5–15.5)
WBC: 6.1 K/uL (ref 4.0–10.5)

## 2024-06-17 LAB — COMPREHENSIVE METABOLIC PANEL WITH GFR
ALT: 13 U/L (ref 3–35)
AST: 16 U/L (ref 5–37)
Albumin: 4.4 g/dL (ref 3.5–5.2)
Alkaline Phosphatase: 63 U/L (ref 39–117)
BUN: 23 mg/dL (ref 6–23)
CO2: 32 meq/L (ref 19–32)
Calcium: 9.4 mg/dL (ref 8.4–10.5)
Chloride: 102 meq/L (ref 96–112)
Creatinine, Ser: 0.82 mg/dL (ref 0.40–1.20)
GFR: 71.59 mL/min
Glucose, Bld: 115 mg/dL — ABNORMAL HIGH (ref 70–99)
Potassium: 4.5 meq/L (ref 3.5–5.1)
Sodium: 138 meq/L (ref 135–145)
Total Bilirubin: 0.3 mg/dL (ref 0.2–1.2)
Total Protein: 7.3 g/dL (ref 6.0–8.3)

## 2024-06-17 LAB — TROPONIN I (HIGH SENSITIVITY): High Sens Troponin I: 4 ng/L (ref 2–17)

## 2024-06-17 NOTE — Patient Instructions (Signed)
 Hypotension with associated lightheadedness, fatigue, and exertional dyspnea Chronic hypotension with recent lightheadedness, fatigue, and exertional dyspnea. Blood pressure consistently low. Vegetarian diet may contribute to low B12 levels. Differential includes dehydration, electrolyte imbalance, low B12, and cardiac causes due to  chf history. - Ordered CBC to assess blood volume and electrolytes. - Ordered B12 and B1 levels. - Ordered thyroid  function tests. - Ordered EKG to compare with previous results. - Ordered high sensitivity troponin.(on review cardiologist mentioned coronary calcification on prior note though on review did not find that on imaging study. Since dyspnea brief this morning only and no chest pain decided to do just one set troponin. Under scenario 2nd  not necessary  - Ordered BNP. - Ordered chest X-ray. - Advised to maintain hydration and monitor blood pressure pending lab and imaging studies.  History of chf Recent shortness of breath without fluid overload or edema. Previous echocardiograms normal. Coronary calcifications noted prior cardio note. - Ordered BNP. - Ordered chest X-ray. - Ordered high sensitivity troponin. -ekg nsr. ekg done in past but could not pull up to compare. Did talk with Dr Krazowski on v1 and v2 abnormality and machine read. Reassured likely not signficant. If bnp and cxr abnormal will get echo.  Follow up date to be determined after lab and imaging review. If any severe change in sign/symptoms advise ED evaluation.

## 2024-06-17 NOTE — Progress Notes (Addendum)
 "  Subjective:    Patient ID: Cindy [name], female    DOB: [birth year], 74 y.o.   MRN: [id number]  HPI  Cindy [name] is a 74 year old female who presents with lightheadedness and shortness of breath.  She developed lightheadedness this morning with a blood pressure of 92/68 mmHg, below her usual 110-118/60-70 range. Eating salty food did not help. She also had shortness of breath when standing and moving this morning. None since. No chest pain. No nausea or vomting. No black or bloody stools.  Over the weekend her systolic blood pressure rose to 150 mmHg, then decreased to 120 mmHg and stabilized in the 130s, returning to about 101 mmHg by Sunday night. Lower bp this morning 92/62. But on recheck today bp mild higher.  She feels fatigued and diffusely tired, worse this morning when she also felt short of breath. She denies calf pain, ankle swelling, or black stools.  She follows a long-term vegetarian diet and uses plant-based protein sources. She drinks about 16-25 ounces of water  daily and feels underhydrated.  She has cancer and prior echocardiograms have been normal, with the last one a year ago.     Review of Systems  Constitutional:  Positive for fatigue. Negative for chills.  HENT:  Negative for congestion, postnasal drip, sinus pressure and sinus pain.   Respiratory:  Positive for shortness of breath. Negative for cough, choking and wheezing.        Only this morning briefly.  Cardiovascular:  Negative for chest pain and palpitations.  Gastrointestinal:  Negative for abdominal pain.  Genitourinary:  Negative for dysuria and flank pain.  Musculoskeletal:  Negative for back pain, joint swelling and neck pain.  Neurological:  Negative for dizziness, weakness and light-headedness.       Light headed at time but states not dizzy.  Hematological:  Negative for adenopathy.  Psychiatric/Behavioral:  Negative for behavioral problems and decreased concentration.     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 if bipolar works the best   Elevated fasting blood sugar    hx   GERD (gastroesophageal reflux disease)    Several years ago   H/O measles    Headache    Hiatal hernia    History of chicken pox    Hx of migraines    Microscopic hematuria    advised to see urology ? never went   Personal history of chemotherapy    Personal history of radiation therapy    Preventative health care 06/20/2011   Radiation dermatitis    chest     Social History   Socioeconomic History   Marital status: Single    Spouse name: Not on file   Number of children: Not on file   Years of education: Not on file   Highest education level: Bachelor's degree (e.g., BA, AB, BS)  Occupational History   Occupation: retired   Tobacco Use   Smoking status: Former    Current packs/day: 0.00    Average packs/day: 0.3 packs/day    Types: Cigarettes    Quit date: 11/23/1981    Years since quitting: 42.5   Smokeless tobacco: Never   Tobacco comments:    social smoker in her 20-30's   Vaping Use   Vaping  status: Never Used  Substance and Sexual Activity   Alcohol use: Yes    Alcohol/week: 1.0 - 2.0 standard drink of alcohol    Types: 1 - 2 Glasses of wine per week    Comment: very rare on social occasions   Drug use: No   Sexual activity: Not Currently    Birth control/protection: None  Other Topics Concern   Not on file  Social History Narrative   HH of 1   2 Cats   Caffeine 2 mugs   Exercise recently some, no major dietary restrictions at this time, vegetarian   Works at [employer] in [employer]   Social Drivers of Health   Tobacco Use: Medium Risk (06/17/2024)   Patient History    Smoking Tobacco Use: Former    Smokeless Tobacco Use: Never    Passive Exposure: Not on file  Financial Resource Strain:  Low Risk (05/07/2023)   Overall Financial Resource Strain (CARDIA)    Difficulty of Paying Living Expenses: Not hard at all  Food Insecurity: No Food Insecurity (05/07/2023)   Hunger Vital Sign    Worried About Running Out of Food in the Last Year: Never true    Ran Out of Food in the Last Year: Never true  Transportation Needs: No Transportation Needs (05/07/2023)   PRAPARE - Administrator, Civil Service (Medical): No    Lack of Transportation (Non-Medical): No  Physical Activity: Insufficiently Active (05/07/2023)   Exercise Vital Sign    Days of Exercise per Week: 1 day    Minutes of Exercise per Session: 10 min  Stress: No Stress Concern Present (05/07/2023)   [employer] of Occupational Health - Occupational Stress Questionnaire    Feeling of Stress : Only a little  Social Connections: Socially Isolated (05/07/2023)   Social Connection and Isolation Panel    Frequency of Communication with Friends and Family: Once a week    Frequency of Social Gatherings with Friends and Family: Once a week    Attends Religious Services: Never    Database Administrator or Organizations: No    Attends Engineer, Structural: Not on file    Marital Status: Never married  Intimate Partner Violence: Not on file  Depression (PHQ2-9): Low Risk (05/14/2022)   Depression (PHQ2-9)    PHQ-2 Score: 0  Alcohol Screen: Low Risk (05/07/2023)   Alcohol Screen    Last Alcohol Screening Score (AUDIT): 2  Housing: Low Risk (05/07/2023)   Housing    Last Housing Risk Score: 0  Utilities: Not on file  Health Literacy: Not on file    Past Surgical History:  Procedure Laterality Date   BREAST BIOPSY Left 01/2019   BREAST BIOPSY Right 07/15/2023   MM RT BREAST BX W LOC DEV 1ST LESION IMAGE BX SPEC STEREO GUIDE 07/15/2023 [facility]   BREAST LUMPECTOMY Left 02/2019   BREAST LUMPECTOMY WITH RADIOACTIVE SEED AND SENTINEL LYMPH NODE BIOPSY Left 03/10/2019   Procedure: LEFT BREAST  LUMPECTOMY WITH RADIOACTIVE SEED AND LEFT SENTINEL LYMPH NODE MAPPING;  Surgeon: Vanderbilt Ned, MD;  Location: [facility] OR;  Service: General;  Laterality: Left;   endometrial polyps     2 surgeries by Dr Lianne   EYE SURGERY  04/29/2009   Rt eye tighten up   PORT-A-CATH REMOVAL Right 11/25/2019   Procedure: PORT REMOVAL;  Surgeon: Vanderbilt Ned, MD;  Location: [facility];  Service: General;  Laterality: Right;   PORTACATH PLACEMENT N/A 03/10/2019   Procedure:  INSERTION PORT-A-CATH WITH ULTRASOUND;  Surgeon: Vanderbilt Ned, MD;  Location: [facility] OR;  Service: General;  Laterality: N/A;   steel plate in skull Left    from a crushed skull    Family History  Problem Relation Age of Onset   Alzheimer's disease Mother    Cancer Mother 54       colon   Thyroid  disease Father    COPD Father    Cancer Father        bladder with cystectomy   Cancer Maternal Grandfather        colon cancer   Cancer Other        maternal grant granmother had breast cancer     Allergies[1]  Medications Ordered Prior to Encounter[2]  BP 100/62   Pulse 88   Temp 98.4 F (36.9 C) (Oral)   Resp 16   Ht 5' 4 (1.626 m)   Wt 148 lb 6.4 oz (67.3 kg)   SpO2 98%   BMI 25.47 kg/m         Objective:   Physical Exam  General Mental Status- Alert. General Appearance- Not in acute distress.   Skin General: Color- Normal Color. Moisture- Normal Moisture.  Neck  No JVD.  Chest and Lung Exam Auscultation: Breath Sounds:-CTA  Cardiovascular Auscultation:Rythm- RRR Murmurs & Other Heart Sounds:Auscultation of the heart reveals- No Murmurs.  Abdomen Inspection:-Inspeection Normal. Palpation/Percussion:Note:No mass. Palpation and Percussion of the abdomen reveal- Non Tender, Non Distended + BS, no rebound or guarding.    NeurologicCranial Nerve exam:-  CN III-XII intact(No nystagmus), symmetric smile. Strength:- 5/5 equal and symmetric strength both upper and lower extremities.    Lower ext- no pedal edema, calfs symmetric, negative homans signs    Assessment & Plan:   Hypotension with associated lightheadedness, fatigue, and exertional dyspnea Chronic hypotension with recent lightheadedness, fatigue, and exertional dyspnea. Blood pressure consistently low. Vegetarian diet may contribute to low B12 levels. Differential includes dehydration, electrolyte imbalance, low B12, and cardiac causes due to  chf history. - Ordered CBC to assess blood volume and electrolytes. - Ordered B12 and B1 levels. - Ordered thyroid  function tests. - Ordered EKG to compare with previous results. - Ordered high sensitivity troponin.(on review cardiologist mentioned coronary calcification on prior note though on review did not find that on imaging study. Since dyspnea brief this morning only and no chest pain decided to do just one set troponin. Under scenario 2nd  not necessary  - Ordered BNP. - Ordered chest X-ray. - Advised to maintain hydration and monitor blood pressure pending lab and imaging studies.  History of chf Recent shortness of breath without fluid overload or edema. Previous echocardiograms normal. Coronary calcifications noted prior cardio note. - Ordered BNP. - Ordered chest X-ray. - Ordered high sensitivity troponin. -ekg nsr. ekg done in past but could not pull up to compare. Did talk with Dr Krazowski on v1 and v2 abnormality and machine read. Reassured likely not signficant. If bnp and cxr abnormal will get echo.  Follow up date to be determined after lab and imaging review. If any severe change in sign/symptoms advise ED evaluation.  Iolani Twilley, PA-C   I personally spent a total of 45 minutes in the care of the patient today including performing a medically appropriate exam/evaluation, counseling and educating, placing orders, referring and communicating with other health care professionals, and documenting clinical information in the EHR.  Total time did not  include EKG time. Note did take extra time talking with cardiologist/reviewing  pt EKG.     [1]  Allergies Allergen Reactions   Bromfed Shortness Of Breath   Venlafaxine     Doesn't remember reaction  [2]  Current Outpatient Medications on File Prior to Visit  Medication Sig Dispense Refill   Ascorbic Acid (VITAMIN C PO) Take by mouth.     BIOTIN PO Take by mouth.     cholecalciferol (VITAMIN D3) 25 MCG (1000 UNIT) tablet Take 1,000 Units by mouth daily.     FLUoxetine  (PROZAC ) 40 MG capsule Take 1 capsule by mouth once daily 100 capsule 0   KRILL OIL PO Take by mouth.     Multiple Vitamin (MULTIVITAMIN WITH MINERALS) TABS tablet Take 1 tablet by mouth daily.     No current facility-administered medications on file prior to visit.   "

## 2024-06-18 ENCOUNTER — Other Ambulatory Visit (INDEPENDENT_AMBULATORY_CARE_PROVIDER_SITE_OTHER)

## 2024-06-18 DIAGNOSIS — R0609 Other forms of dyspnea: Secondary | ICD-10-CM

## 2024-06-18 LAB — TSH: TSH: 1.11 u[IU]/mL (ref 0.35–5.50)

## 2024-06-18 LAB — BRAIN NATRIURETIC PEPTIDE: Pro B Natriuretic peptide (BNP): 22 pg/mL (ref 1.0–100.0)

## 2024-06-18 LAB — T4, FREE: Free T4: 0.92 ng/dL (ref 0.60–1.60)

## 2024-06-18 LAB — VITAMIN B12: Vitamin B-12: 375 pg/mL (ref 211–911)

## 2024-06-18 NOTE — Progress Notes (Signed)
 Last read by Rock GORMAN Malady at 9:22AM on 06/18/2024

## 2024-06-18 NOTE — Addendum Note (Signed)
 Addended by: TRUDY CURVIN RAMAN on: 06/18/2024 09:07 AM   Modules accepted: Orders

## 2024-06-21 LAB — VITAMIN B1: Vitamin B1 (Thiamine): 33 nmol/L — ABNORMAL HIGH (ref 8–30)

## 2024-06-21 LAB — IRON,TIBC AND FERRITIN PANEL
%SAT: 25 % (ref 16–45)
Ferritin: 43 ng/mL (ref 16–288)
Iron: 84 ug/dL (ref 45–160)
TIBC: 337 ug/dL (ref 250–450)

## 2024-06-23 ENCOUNTER — Ambulatory Visit

## 2024-06-25 ENCOUNTER — Telehealth: Payer: Self-pay

## 2024-06-25 NOTE — Telephone Encounter (Signed)
 Patient was advised via message that she can call and schedule lab appointment before her office visit.

## 2024-06-25 NOTE — Telephone Encounter (Signed)
 Copied from CRM #[id number]. Topic: Clinical - Request for Lab/Test Order >> Jun 25, 2024  3:59 PM Cindy [name] wrote: Reason for CRM: Patient wanted to request lab orders for PCP's physical-scheduled in August

## 2024-06-26 ENCOUNTER — Telehealth: Payer: Self-pay | Admitting: Family Medicine

## 2024-06-26 NOTE — Telephone Encounter (Signed)
 Copied from CRM [id number]. Topic: Referral - Question >> Jun 26, 2024  2:41 PM Alfonso HERO wrote: Reason for CRM: Patient calling to see if she can be referred to Dr Pietro or someone in the cardiology office. Asking for a call back.

## 2024-07-01 ENCOUNTER — Other Ambulatory Visit: Payer: Self-pay | Admitting: Family Medicine

## 2024-07-01 DIAGNOSIS — E785 Hyperlipidemia, unspecified: Secondary | ICD-10-CM

## 2024-07-01 DIAGNOSIS — R0600 Dyspnea, unspecified: Secondary | ICD-10-CM

## 2024-07-01 DIAGNOSIS — I959 Hypotension, unspecified: Secondary | ICD-10-CM

## 2024-07-01 DIAGNOSIS — R7309 Other abnormal glucose: Secondary | ICD-10-CM

## 2024-07-07 NOTE — Progress Notes (Unsigned)
 "    Referring-Edward Saguier PA-C Reason for referral-chronic hypotension and exertional dyspnea  HPI: 74 year old female for evaluation of chronic hypotension and exertional dyspnea at request of Edward Saguier PA-C. Previously seen by Dr. Bensimhon (had breast cancer and previously been treated with Taxol ).  Echocardiogram January 2023 showed normal LV function and trace aortic insufficiency.  Seen January 7 with complaints of dizziness and exertional dyspnea.  Chest x-ray showed no active disease.  proBNP 22.  Troponin I 4.  Creatinine 0.82, hemoglobin 14.3 and TSH normal.  Cardiology asked to evaluate.  Current Outpatient Medications  Medication Sig Dispense Refill   Ascorbic Acid (VITAMIN C PO) Take by mouth.     BIOTIN PO Take by mouth.     cholecalciferol (VITAMIN D3) 25 MCG (1000 UNIT) tablet Take 1,000 Units by mouth daily.     FLUoxetine  (PROZAC ) 40 MG capsule Take 1 capsule by mouth once daily 100 capsule 0   KRILL OIL PO Take by mouth.     Multiple Vitamin (MULTIVITAMIN WITH MINERALS) TABS tablet Take 1 tablet by mouth daily.     No current facility-administered medications for this visit.    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 if bipolar works the best   Elevated fasting blood sugar    hx   GERD (gastroesophageal reflux disease)    Several years ago   H/O measles    Headache    Hiatal hernia    History of chicken pox    Hx of migraines    Microscopic hematuria    advised to see urology ? never went   Personal history of chemotherapy    Personal history of radiation therapy    Preventative health care 06/20/2011   Radiation dermatitis    chest    Past Surgical History:  Procedure Laterality Date   BREAST BIOPSY Left 01/2019   BREAST BIOPSY Right 07/15/2023   MM RT BREAST  BX W LOC DEV 1ST LESION IMAGE BX SPEC STEREO GUIDE 07/15/2023 [facility]   BREAST LUMPECTOMY Left 02/2019   BREAST LUMPECTOMY WITH RADIOACTIVE SEED AND SENTINEL LYMPH NODE BIOPSY Left 03/10/2019   Procedure: LEFT BREAST LUMPECTOMY WITH RADIOACTIVE SEED AND LEFT SENTINEL LYMPH NODE MAPPING;  Surgeon: Vanderbilt Ned, MD;  Location: [facility];  Service: General;  Laterality: Left;   endometrial polyps     2 surgeries by Dr Lianne   EYE SURGERY  04/29/2009   Rt eye tighten up   PORT-A-CATH REMOVAL Right 11/25/2019   Procedure: PORT REMOVAL;  Surgeon: Vanderbilt Ned, MD;  Location: [facility];  Service: General;  Laterality: Right;   PORTACATH PLACEMENT N/A 03/10/2019   Procedure: INSERTION PORT-A-CATH WITH ULTRASOUND;  Surgeon: Vanderbilt Ned, MD;  Location: [facility];  Service: General;  Laterality: N/A;   steel plate in skull Left    from a crushed skull    Social History   Socioeconomic History   Marital status: Single    Spouse name: Not on file   Number of children: Not on file   Years of education: Not on file   Highest education level: Bachelor's degree (e.g., BA, AB, BS)  Occupational History   Occupation: retired   Tobacco Use   Smoking status: Former  Current packs/day: 0.00    Average packs/day: 0.3 packs/day    Types: Cigarettes    Quit date: 11/23/1981    Years since quitting: 42.6   Smokeless tobacco: Never   Tobacco comments:    social smoker in her 20-30's   Vaping Use   Vaping status: Never Used  Substance and Sexual Activity   Alcohol use: Yes    Alcohol/week: 1.0 - 2.0 standard drink of alcohol    Types: 1 - 2 Glasses of wine per week    Comment: very rare on social occasions   Drug use: No   Sexual activity: Not Currently    Birth control/protection: None  Other Topics Concern   Not on file  Social History Narrative   HH of 1   2 Cats   Caffeine 2 mugs   Exercise recently some, no major dietary restrictions at this time,  vegetarian   Works at [employer] in [employer]   Social Drivers of Health   Tobacco Use: Medium Risk (06/17/2024)   Patient History    Smoking Tobacco Use: Former    Smokeless Tobacco Use: Never    Passive Exposure: Not on file  Financial Resource Strain: Low Risk (05/07/2023)   Overall Financial Resource Strain (CARDIA)    Difficulty of Paying Living Expenses: Not hard at all  Food Insecurity: No Food Insecurity (05/07/2023)   Hunger Vital Sign    Worried About Running Out of Food in the Last Year: Never true    Ran Out of Food in the Last Year: Never true  Transportation Needs: No Transportation Needs (05/07/2023)   PRAPARE - Administrator, Civil Service (Medical): No    Lack of Transportation (Non-Medical): No  Physical Activity: Insufficiently Active (05/07/2023)   Exercise Vital Sign    Days of Exercise per Week: 1 day    Minutes of Exercise per Session: 10 min  Stress: No Stress Concern Present (05/07/2023)   [employer] of Occupational Health - Occupational Stress Questionnaire    Feeling of Stress : Only a little  Social Connections: Socially Isolated (05/07/2023)   Social Connection and Isolation Panel    Frequency of Communication with Friends and Family: Once a week    Frequency of Social Gatherings with Friends and Family: Once a week    Attends Religious Services: Never    Database Administrator or Organizations: No    Attends Engineer, Structural: Not on file    Marital Status: Never married  Intimate Partner Violence: Not on file  Depression (PHQ2-9): Low Risk (05/14/2022)   Depression (PHQ2-9)    PHQ-2 Score: 0  Alcohol Screen: Low Risk (05/07/2023)   Alcohol Screen    Last Alcohol Screening Score (AUDIT): 2  Housing: Low Risk (05/07/2023)   Housing    Last Housing Risk Score: 0  Utilities: Not on file  Health Literacy: Not on file    Family History  Problem Relation Age of Onset   Alzheimer's disease Mother    Cancer Mother 42        colon   Thyroid  disease Father    COPD Father    Cancer Father        bladder with cystectomy   Cancer Maternal Grandfather        colon cancer   Cancer Other        maternal grant granmother had breast cancer     ROS: no fevers or chills, productive cough, hemoptysis, dysphasia, odynophagia, melena, hematochezia,  dysuria, hematuria, rash, seizure activity, orthopnea, PND, pedal edema, claudication. Remaining systems are negative.  Physical Exam:   There were no vitals taken for this visit.  General:  Well developed/well nourished in NAD Skin warm/dry Patient not depressed No peripheral clubbing Back-normal HEENT-normal/normal eyelids Neck supple/normal carotid upstroke bilaterally; no bruits; no JVD; no thyromegaly chest - CTA/ normal expansion CV - RRR/normal S1 and S2; no murmurs, rubs or gallops;  PMI nondisplaced Abdomen -NT/ND, no HSM, no mass, + bowel sounds, no bruit 2+ femoral pulses, no bruits Ext-no edema, chords, 2+ DP Neuro-grossly nonfocal  ECG -June 17, 2024-normal sinus rhythm, prior septal infarct cannot be excluded.  Personally reviewed  A/P  1 dyspnea-  2 chronic hypotension-    Redell Shallow, MD     [1]  Allergies Allergen Reactions   Bromfed Shortness Of Breath   Venlafaxine     Doesn't remember reaction   "

## 2024-07-08 ENCOUNTER — Ambulatory Visit: Admitting: Cardiology

## 2024-07-16 ENCOUNTER — Ambulatory Visit: Admitting: Cardiology

## 2024-07-20 ENCOUNTER — Ambulatory Visit

## 2024-07-30 ENCOUNTER — Ambulatory Visit: Admitting: Cardiology

## 2024-08-13 ENCOUNTER — Other Ambulatory Visit

## 2024-08-19 ENCOUNTER — Other Ambulatory Visit

## 2024-08-20 ENCOUNTER — Encounter: Admitting: Family Medicine

## 2024-08-25 ENCOUNTER — Encounter: Admitting: Student

## 2024-09-30 ENCOUNTER — Encounter: Admitting: Student

## 2025-02-04 ENCOUNTER — Encounter: Admitting: Family Medicine
